# Patient Record
Sex: Male | Born: 1975 | Race: White | Hispanic: No | Marital: Married | Smoking: Never smoker
Health system: Southern US, Community
[De-identification: ages and names within clinical notes are randomized; demographics above are authoritative.]

## PROBLEM LIST (undated history)

## (undated) DIAGNOSIS — M75102 Unspecified rotator cuff tear or rupture of left shoulder, not specified as traumatic: Secondary | ICD-10-CM

## (undated) DIAGNOSIS — J45909 Unspecified asthma, uncomplicated: Secondary | ICD-10-CM

## (undated) DIAGNOSIS — M12812 Other specific arthropathies, not elsewhere classified, left shoulder: Secondary | ICD-10-CM

## (undated) DIAGNOSIS — J301 Allergic rhinitis due to pollen: Secondary | ICD-10-CM

## (undated) DIAGNOSIS — I10 Essential (primary) hypertension: Secondary | ICD-10-CM

## (undated) DIAGNOSIS — M25562 Pain in left knee: Secondary | ICD-10-CM

## (undated) DIAGNOSIS — F32A Depression, unspecified: Secondary | ICD-10-CM

## (undated) DIAGNOSIS — F329 Major depressive disorder, single episode, unspecified: Secondary | ICD-10-CM

## (undated) DIAGNOSIS — K219 Gastro-esophageal reflux disease without esophagitis: Secondary | ICD-10-CM

## (undated) DIAGNOSIS — M25561 Pain in right knee: Secondary | ICD-10-CM

## (undated) DIAGNOSIS — F319 Bipolar disorder, unspecified: Secondary | ICD-10-CM

## (undated) DIAGNOSIS — G8929 Other chronic pain: Secondary | ICD-10-CM

## (undated) DIAGNOSIS — G4700 Insomnia, unspecified: Secondary | ICD-10-CM

## (undated) HISTORY — DX: Morbid (severe) obesity due to excess calories: E66.01

## (undated) HISTORY — DX: Unspecified asthma, uncomplicated: J45.909

## (undated) HISTORY — DX: Allergic rhinitis due to pollen: J30.1

## (undated) HISTORY — DX: Gastro-esophageal reflux disease without esophagitis: K21.9

## (undated) HISTORY — DX: Pain in right knee: M25.561

## (undated) HISTORY — DX: Other specific arthropathies, not elsewhere classified, left shoulder: M12.812

## (undated) HISTORY — DX: Depression, unspecified: F32.A

## (undated) HISTORY — DX: Bipolar disorder, unspecified: F31.9

## (undated) HISTORY — PX: BICEPS TENDON REPAIR: SHX566

## (undated) HISTORY — DX: Insomnia, unspecified: G47.00

## (undated) HISTORY — DX: Unspecified rotator cuff tear or rupture of left shoulder, not specified as traumatic: M75.102

## (undated) HISTORY — DX: Pain in left knee: M25.562

## (undated) HISTORY — DX: Other chronic pain: G89.29

## (undated) HISTORY — DX: Essential (primary) hypertension: I10

## (undated) HISTORY — DX: Major depressive disorder, single episode, unspecified: F32.9

---

## 2016-06-13 ENCOUNTER — Telehealth: Payer: Self-pay | Admitting: *Deleted

## 2016-06-13 NOTE — Telephone Encounter (Signed)
PreVisit Call completed. Pt states he turned in his New Patient Packet. Pt states he received a flu vaccine 12/2015. Unknown last Tdap. He states he needs medication refills. He states he has not had blood work done in over 6 months. Pt states that he has been experiencing bilat foot pain x3 weeks. He denies any open areas or wounds. He states it feels more like a nerve pain that starts in the heel of his foot and shoots to his toes.

## 2016-06-14 ENCOUNTER — Ambulatory Visit: Payer: BC Managed Care – PPO | Admitting: Family Medicine

## 2016-06-15 ENCOUNTER — Encounter: Payer: Self-pay | Admitting: Family Medicine

## 2016-06-15 ENCOUNTER — Other Ambulatory Visit: Payer: Self-pay

## 2016-06-15 ENCOUNTER — Ambulatory Visit (INDEPENDENT_AMBULATORY_CARE_PROVIDER_SITE_OTHER): Payer: BC Managed Care – PPO | Admitting: Family Medicine

## 2016-06-15 VITALS — BP 142/84 | HR 80 | Temp 98.0°F | Ht 72.0 in | Wt 310.8 lb

## 2016-06-15 DIAGNOSIS — J301 Allergic rhinitis due to pollen: Secondary | ICD-10-CM | POA: Diagnosis not present

## 2016-06-15 DIAGNOSIS — K219 Gastro-esophageal reflux disease without esophagitis: Secondary | ICD-10-CM | POA: Diagnosis not present

## 2016-06-15 DIAGNOSIS — J4540 Moderate persistent asthma, uncomplicated: Secondary | ICD-10-CM | POA: Insufficient documentation

## 2016-06-15 DIAGNOSIS — E782 Mixed hyperlipidemia: Secondary | ICD-10-CM

## 2016-06-15 DIAGNOSIS — J4520 Mild intermittent asthma, uncomplicated: Secondary | ICD-10-CM | POA: Diagnosis not present

## 2016-06-15 DIAGNOSIS — E1142 Type 2 diabetes mellitus with diabetic polyneuropathy: Secondary | ICD-10-CM | POA: Diagnosis not present

## 2016-06-15 DIAGNOSIS — E1159 Type 2 diabetes mellitus with other circulatory complications: Secondary | ICD-10-CM | POA: Insufficient documentation

## 2016-06-15 DIAGNOSIS — G629 Polyneuropathy, unspecified: Secondary | ICD-10-CM | POA: Diagnosis not present

## 2016-06-15 DIAGNOSIS — M25561 Pain in right knee: Secondary | ICD-10-CM

## 2016-06-15 DIAGNOSIS — M25562 Pain in left knee: Secondary | ICD-10-CM

## 2016-06-15 DIAGNOSIS — G4700 Insomnia, unspecified: Secondary | ICD-10-CM | POA: Diagnosis not present

## 2016-06-15 DIAGNOSIS — R945 Abnormal results of liver function studies: Secondary | ICD-10-CM

## 2016-06-15 DIAGNOSIS — M75102 Unspecified rotator cuff tear or rupture of left shoulder, not specified as traumatic: Secondary | ICD-10-CM

## 2016-06-15 DIAGNOSIS — M12812 Other specific arthropathies, not elsewhere classified, left shoulder: Secondary | ICD-10-CM | POA: Diagnosis not present

## 2016-06-15 DIAGNOSIS — R7989 Other specified abnormal findings of blood chemistry: Secondary | ICD-10-CM | POA: Diagnosis not present

## 2016-06-15 DIAGNOSIS — F39 Unspecified mood [affective] disorder: Secondary | ICD-10-CM | POA: Insufficient documentation

## 2016-06-15 DIAGNOSIS — F319 Bipolar disorder, unspecified: Secondary | ICD-10-CM

## 2016-06-15 DIAGNOSIS — G8929 Other chronic pain: Secondary | ICD-10-CM

## 2016-06-15 DIAGNOSIS — F3181 Bipolar II disorder: Secondary | ICD-10-CM | POA: Insufficient documentation

## 2016-06-15 DIAGNOSIS — J45909 Unspecified asthma, uncomplicated: Secondary | ICD-10-CM | POA: Insufficient documentation

## 2016-06-15 DIAGNOSIS — I10 Essential (primary) hypertension: Secondary | ICD-10-CM | POA: Insufficient documentation

## 2016-06-15 DIAGNOSIS — E559 Vitamin D deficiency, unspecified: Secondary | ICD-10-CM

## 2016-06-15 HISTORY — DX: Insomnia, unspecified: G47.00

## 2016-06-15 HISTORY — DX: Other specific arthropathies, not elsewhere classified, left shoulder: M12.812

## 2016-06-15 HISTORY — DX: Unspecified rotator cuff tear or rupture of left shoulder, not specified as traumatic: M75.102

## 2016-06-15 HISTORY — DX: Other chronic pain: G89.29

## 2016-06-15 HISTORY — DX: Allergic rhinitis due to pollen: J30.1

## 2016-06-15 HISTORY — DX: Gastro-esophageal reflux disease without esophagitis: K21.9

## 2016-06-15 HISTORY — DX: Morbid (severe) obesity due to excess calories: E66.01

## 2016-06-15 HISTORY — DX: Bipolar disorder, unspecified: F31.9

## 2016-06-15 MED ORDER — OMEPRAZOLE 40 MG PO CPDR: 40.0000 mg | DELAYED_RELEASE_CAPSULE | Freq: Every day | ORAL | 3 refills | Status: DC

## 2016-06-15 MED ORDER — ZOLPIDEM TARTRATE 10 MG PO TABS: 10.0000 mg | ORAL_TABLET | Freq: Every day | ORAL | 3 refills | Status: DC

## 2016-06-15 MED ORDER — CLONAZEPAM 0.5 MG PO TABS: 0.5000 mg | ORAL_TABLET | Freq: Every day | ORAL | 1 refills | Status: DC

## 2016-06-15 MED ORDER — AMLODIPINE BESYLATE 5 MG PO TABS: 5.0000 mg | ORAL_TABLET | Freq: Every day | ORAL | 3 refills | Status: DC

## 2016-06-15 MED ORDER — ESCITALOPRAM OXALATE 20 MG PO TABS: 20.0000 mg | ORAL_TABLET | Freq: Every day | ORAL | 3 refills | Status: DC

## 2016-06-15 NOTE — Progress Notes (Signed)
Pre visit review using our clinic review tool, if applicable. No additional management support is needed unless otherwise documented below in the visit note. 

## 2016-06-15 NOTE — Patient Instructions (Signed)
It was nice to meet you!

## 2016-06-15 NOTE — Progress Notes (Signed)
Brett [name] is a 41 y.o. male is here to [place].   History of Present Illness:   Water quality scientist, CMA, acting as scribe for Dr. Juleen China.  CC: States he is here to establish care today.  Also needs medication refills and has constant pain in both feet.  States the pain is severe.  Sharp and stabbing in nature.  States he has joint pain in both knees and his left shoulder as well.  He has taken Aleve for his joint pain and states that it helps somewhat.  Would like to find other options because he is afraid long-term use of Aleve will cause health problems.   HPI:  1. Bipolar 2 disorder ([place]). Previously followed by Psych. Cycles fairly quickly. Has issues with "anger" when he is up. Does well on current regimen. Sometimes needs a benzodiazepine to help him sleep. Psychological ROS: negative for - hallucinations, hostility, obsessive thoughts or suicidal ideation.   2. Left rotator cuff tear arthropathy. Previous tear. Never had repair or rehab. Has ROM but with pain. Taking daily NSAIDs.    3. Essential hypertension.  On Norvasc. Cardiovascular ROS: no chest pain or dyspnea on exertion.   4. Gastroesophageal reflux disease without esophagitis. Daily Prilosec. Gastrointestinal ROS: no abdominal pain, change in bowel habits, or black or bloody stools.   5. Chronic seasonal allergic rhinitis due to pollen. Stable. Takes Allegra daily. Allergy and Immunology ROS: negative for - itchy/watery eyes, nasal congestion or postnasal drip.   6. Pain in Feet. NEW. Bilateral. Feels numb. Terrible pain when something touches his feet. This has been going on for months but he never discussed it with his previous PCP. No treatment.     7. Morbid obesity ([place]). Intentional weight loss of 50 pounds through eating a healthier diet. Wants to get back to exercising.    8. Insomnia, unspecified type. Due to Bipolar swing as above.    9. Chronic pain of both . Arthralgia. He was a runner for years.  Meniscus injury to left knee in the past. ROS: positive for cracking, popping, sometimes edema of knees.   Health Maintenance Due  Topic Date Due  . TETANUS/TDAP  12/30/1993   PMHx, SurgHx, SocialHx, Medications, and Allergies were reviewed in the Visit Navigator and updated as appropriate.   Past Medical History:  Diagnosis Date  . Asthma   . Bipolar 1 disorder ([place]) 06/15/2016  . Chronic pain of both knees 06/15/2016  . Chronic seasonal allergic rhinitis due to pollen 06/15/2016  . Depression   . Gastroesophageal reflux disease without esophagitis 06/15/2016  . Hypertension   . Insomnia 06/15/2016  . Left rotator cuff tear arthropathy 06/15/2016  . Morbid obesity ([place]) 06/15/2016   Past Surgical History:  Procedure Laterality Date  . BICEPS TENDON REPAIR     History reviewed. No pertinent family history.  Social History  Substance Use Topics  . Smoking status: Never Smoker  . Smokeless tobacco: Never Used  . Alcohol use No   Current Medications and Allergies:   .  amLODipine (NORVASC) 5 MG tablet, Take 1 tablet (5 mg total) by mouth daily., Disp: 90 tablet, Rfl: 3 .  cyanocobalamin 1000 MCG tablet, Take 1,000 mcg by mouth daily., Disp: , Rfl:  .  escitalopram (LEXAPRO) 20 MG tablet, Take 1 tablet (20 mg total) by mouth at bedtime., Disp: 90 tablet, Rfl: 3 .  Fexofenadine HCl (ALLEGRA ALLERGY PO), Take by mouth., Disp: , Rfl:  .  MAGNESIUM CITRATE PO,  Take 1 tablet by mouth every evening., Disp: , Rfl:  .  zolpidem (AMBIEN) 10 MG tablet, Take 1 tablet (10 mg total) by mouth at bedtime., Disp: 30 tablet, Rfl: 3 .  celecoxib (CELEBREX) 100 MG capsule, Take 1 capsule (100 mg total) by mouth 2 (two) times daily as needed., Disp: 60 capsule, Rfl: 2 .  clonazePAM (KLONOPIN) 0.5 MG tablet, Take 1 tablet (0.5 mg total) by mouth at bedtime., Disp: 20 tablet, Rfl: 1 .  omeprazole (PRILOSEC) 40 MG capsule, Take 1 capsule (40 mg total) by mouth daily., Disp: 30 capsule, Rfl: 3  No Known  Allergies   Review of Systems:   Review of Systems  Constitutional: Negative for chills and fever.  HENT: Positive for congestion. Negative for sinus pain.   Eyes: Negative for blurred vision.  Respiratory: Positive for cough.   Cardiovascular: Negative for chest pain and palpitations.  Gastrointestinal: Negative for abdominal pain, nausea and vomiting.  Genitourinary: Negative for frequency.  Musculoskeletal: Positive for joint pain.  Skin: Negative for rash.  Neurological: Negative for loss of consciousness and headaches.  Psychiatric/Behavioral: Positive for depression.    Vitals:   Vitals:   06/15/16 1409  BP: (!) 142/84  Pulse: 80  Temp: 98 F (36.7 C)  TempSrc: Oral  SpO2: 94%  Weight: (!) 310 lb 12.8 oz (141 kg)  Height: 6' (1.829 m)     Body mass index is 42.15 kg/m.  Physical Exam:   Physical Exam  Constitutional: He is oriented to person, place, and time. He appears well-developed and well-nourished. No distress.  HENT:  Head: Normocephalic and atraumatic.  Right Ear: External ear normal.  Left Ear: External ear normal.  Nose: Nose normal.  Mouth/Throat: Oropharynx is clear and moist.  Eyes: Conjunctivae and EOM are normal. Pupils are equal, round, and reactive to light.  Neck: Normal range of motion. Neck supple.  Cardiovascular: Normal rate, regular rhythm, normal heart sounds and intact distal pulses.   Pulmonary/Chest: Effort normal and breath sounds normal.  Abdominal: Soft. Bowel sounds are normal.  Musculoskeletal:       Left shoulder: He exhibits decreased range of motion.       Right knee: Tenderness found. Medial joint line and lateral joint line tenderness noted.       Left knee: Tenderness found. Medial joint line and lateral joint line tenderness noted.  Neurological: He is alert and oriented to person, place, and time.  Skin: Skin is warm and dry.  Psychiatric: He has a normal mood and affect. His behavior is normal. Judgment and thought  content normal.  Nursing note and vitals reviewed.   Assessment and Plan:    Yaser was seen today for establish care, foot pain and medication refill.  Diagnoses and all orders for this visit:  Bipolar 2 disorder ([place]) -     Ambulatory referral to Psychiatry -     clonazePAM (KLONOPIN) 0.5 MG tablet; Take 1 tablet (0.5 mg total) by mouth at bedtime.       -     escitalopram (LEXAPRO) 20 MG tablet; Take 1 tablet (20 mg total) by mouth at bedtime.  Left rotator cuff tear arthropathy Comments: Patient with ROM but obvious pain. Taking NSAIDs daily. He would like to rehab the shoulder and improve pain. Will refer to RIGBY/JAMES.   Essential hypertension Comments: Elevated today. Labs pending. Patient will monitor. Recheck at next visit.   Orders:       -     amLODipine (  NORVASC) 5 MG tablet; Take 1 tablet (5 mg total) by mouth daily.  Gastroesophageal reflux disease without esophagitis Comments: Stable.  Chronic seasonal allergic rhinitis due to pollen Comments: Well controlled.  No signs of complications, medication side effects, or red flags.  Continue current regimen.    Peripheral polyneuropathy ([place]) Comments: New. Labs pending. Discussed Neurontin as a medication that could help with PN as well as mood stabilization.  Orders: -     Vitamin B12 -     CBC with Differential/Platelet  Morbid obesity ([place]) Comments: Improving. Orders: -     Hemoglobin A1c -     Comprehensive metabolic panel -     T4, free -     TSH -     Lipid panel  Insomnia, unspecified type Comments: Affected by Bipolar.         Orders:  -     zolpidem (AMBIEN) 10 MG tablet; Take 1 tablet (10 mg total) by mouth at bedtime.  Chronic pain of both knees Comments: Arthralgia. Hx of meniscus injury to left knee.  Orders: -     AMB referral to sports medicine  Vitamin D deficiency Comments: . Orders: -     VITAMIN D 25 Hydroxy (Vit-D Deficiency, Fractures)  Mild intermittent asthma  without complication Comments: Stable.   . Reviewed expectations re: course of current medical issues. . Discussed self-management of symptoms. . Outlined signs and symptoms indicating need for more acute intervention. . Patient verbalized understanding and all questions were answered. . See orders for this visit as documented in the electronic medical record. . Patient received an After Visit Summary.  Records requested if needed. I spent 60 minutes with this patient, greater than 50% was face-to-face time counseling regarding the above diagnoses.  CMA served as Education administrator during this visit. History, Physical, and Plan performed by medical provider. Documentation and orders reviewed and attested to. Briscoe Deutscher, D.O.  Briscoe Deutscher, [place], [place] 06/16/2016   Follow-up: 3 months.  Meds ordered this encounter  Medications  . DISCONTD: naproxen sodium (ANAPROX) 220 MG tablet    Sig: Take by mouth.  . DISCONTD: amLODipine (NORVASC) 5 MG tablet    Sig: Take by mouth.  . DISCONTD: escitalopram (LEXAPRO) 20 MG tablet    Sig: Take by mouth.  . DISCONTD: omeprazole (PRILOSEC) 40 MG capsule    Sig: Take by mouth.  . DISCONTD: zolpidem (AMBIEN) 10 MG tablet  . cyanocobalamin 1000 MCG tablet    Sig: Take 1,000 mcg by mouth daily.  Marland Kitchen MAGNESIUM CITRATE PO    Sig: Take 1 tablet by mouth every evening.  Marland Kitchen DISCONTD: Naproxen Sodium (ALEVE) 220 MG CAPS    Sig: Take 3 capsules by mouth 2 (two) times daily.  Marland Kitchen Fexofenadine HCl (ALLEGRA ALLERGY PO)    Sig: Take by mouth.  Marland Kitchen amLODipine (NORVASC) 5 MG tablet    Sig: Take 1 tablet (5 mg total) by mouth daily.    Dispense:  90 tablet    Refill:  3  . escitalopram (LEXAPRO) 20 MG tablet    Sig: Take 1 tablet (20 mg total) by mouth at bedtime.    Dispense:  90 tablet    Refill:  3  . zolpidem (AMBIEN) 10 MG tablet    Sig: Take 1 tablet (10 mg total) by mouth at bedtime.    Dispense:  30 tablet    Refill:  3  . clonazePAM  (KLONOPIN) 0.5 MG tablet    Sig:  Take 1 tablet (0.5 mg total) by mouth at bedtime.    Dispense:  20 tablet    Refill:  1   Medications Discontinued During This Encounter  Medication Reason  . naproxen sodium (ANAPROX) 220 MG tablet Error  . amLODipine (NORVASC) 5 MG tablet Reorder  . escitalopram (LEXAPRO) 20 MG tablet Reorder  . zolpidem (AMBIEN) 10 MG tablet Reorder   Orders Placed This Encounter  Procedures  . Hemoglobin A1c  . Comprehensive metabolic panel  . T4, free  . TSH  . Vitamin B12  . VITAMIN D 25 Hydroxy (Vit-D Deficiency, Fractures)  . Lipid panel  . CBC with Differential/Platelet  . AMB referral to sports medicine  . Ambulatory referral to Psychiatry

## 2016-06-16 ENCOUNTER — Ambulatory Visit (INDEPENDENT_AMBULATORY_CARE_PROVIDER_SITE_OTHER): Payer: BC Managed Care – PPO | Admitting: Sports Medicine

## 2016-06-16 ENCOUNTER — Encounter: Payer: Self-pay | Admitting: Sports Medicine

## 2016-06-16 ENCOUNTER — Encounter: Payer: Self-pay | Admitting: Family Medicine

## 2016-06-16 ENCOUNTER — Ambulatory Visit (INDEPENDENT_AMBULATORY_CARE_PROVIDER_SITE_OTHER): Payer: BC Managed Care – PPO

## 2016-06-16 VITALS — BP 140/110 | HR 102 | Ht 72.0 in | Wt 308.0 lb

## 2016-06-16 DIAGNOSIS — R945 Abnormal results of liver function studies: Secondary | ICD-10-CM

## 2016-06-16 DIAGNOSIS — E1142 Type 2 diabetes mellitus with diabetic polyneuropathy: Secondary | ICD-10-CM | POA: Insufficient documentation

## 2016-06-16 DIAGNOSIS — G8929 Other chronic pain: Secondary | ICD-10-CM | POA: Diagnosis not present

## 2016-06-16 DIAGNOSIS — M7542 Impingement syndrome of left shoulder: Secondary | ICD-10-CM

## 2016-06-16 DIAGNOSIS — M25512 Pain in left shoulder: Secondary | ICD-10-CM | POA: Diagnosis not present

## 2016-06-16 DIAGNOSIS — R7989 Other specified abnormal findings of blood chemistry: Secondary | ICD-10-CM | POA: Insufficient documentation

## 2016-06-16 DIAGNOSIS — M25561 Pain in right knee: Secondary | ICD-10-CM | POA: Diagnosis not present

## 2016-06-16 DIAGNOSIS — E785 Hyperlipidemia, unspecified: Secondary | ICD-10-CM

## 2016-06-16 DIAGNOSIS — M25819 Other specified joint disorders, unspecified shoulder: Secondary | ICD-10-CM | POA: Insufficient documentation

## 2016-06-16 DIAGNOSIS — M7540 Impingement syndrome of unspecified shoulder: Secondary | ICD-10-CM | POA: Insufficient documentation

## 2016-06-16 DIAGNOSIS — E1169 Type 2 diabetes mellitus with other specified complication: Secondary | ICD-10-CM | POA: Insufficient documentation

## 2016-06-16 DIAGNOSIS — M25562 Pain in left knee: Secondary | ICD-10-CM

## 2016-06-16 LAB — CBC WITH DIFFERENTIAL/PLATELET
Basophils Absolute: 0.1 10*3/uL (ref 0.0–0.1)
Basophils Relative: 1.1 % (ref 0.0–3.0)
Eosinophils Absolute: 0.2 10*3/uL (ref 0.0–0.7)
Eosinophils Relative: 2.8 % (ref 0.0–5.0)
HCT: 49 % (ref 39.0–52.0)
Hemoglobin: 16.5 g/dL (ref 13.0–17.0)
Lymphocytes Relative: 38.7 % (ref 12.0–46.0)
Lymphs Abs: 3.4 10*3/uL (ref 0.7–4.0)
MCHC: 33.7 g/dL (ref 30.0–36.0)
MCV: 85.9 fl (ref 78.0–100.0)
Monocytes Absolute: 0.7 10*3/uL (ref 0.1–1.0)
Monocytes Relative: 7.4 % (ref 3.0–12.0)
Neutro Abs: 4.5 10*3/uL (ref 1.4–7.7)
Neutrophils Relative %: 50 % (ref 43.0–77.0)
Platelets: 186 10*3/uL (ref 150.0–400.0)
RBC: 5.7 Mil/uL (ref 4.22–5.81)
RDW: 13.5 % (ref 11.5–15.5)
WBC: 8.9 10*3/uL (ref 4.0–10.5)

## 2016-06-16 LAB — LIPID PANEL
Cholesterol: 213 mg/dL — ABNORMAL HIGH (ref 0–200)
HDL: 40.9 mg/dL (ref >39.00)
LDL Cholesterol: 133 mg/dL — ABNORMAL HIGH (ref 0–99)
NonHDL: 172.24
Total CHOL/HDL Ratio: 5
Triglycerides: 198 mg/dL — ABNORMAL HIGH (ref 0.0–149.0)
VLDL: 39.6 mg/dL (ref 0.0–40.0)

## 2016-06-16 LAB — VITAMIN B12: Vitamin B-12: 1003 pg/mL — ABNORMAL HIGH (ref 211–911)

## 2016-06-16 LAB — COMPREHENSIVE METABOLIC PANEL
ALT: 91 U/L — ABNORMAL HIGH (ref 0–53)
AST: 44 U/L — ABNORMAL HIGH (ref 0–37)
Albumin: 4.6 g/dL (ref 3.5–5.2)
Alkaline Phosphatase: 68 U/L (ref 39–117)
BUN: 14 mg/dL (ref 6–23)
CO2: 31 mEq/L (ref 19–32)
Calcium: 10.1 mg/dL (ref 8.4–10.5)
Chloride: 97 mEq/L (ref 96–112)
Creatinine, Ser: 0.72 mg/dL (ref 0.40–1.50)
GFR: 127.58 mL/min (ref >60.00)
Glucose, Bld: 216 mg/dL — ABNORMAL HIGH (ref 70–99)
Potassium: 4.5 mEq/L (ref 3.5–5.1)
Sodium: 138 mEq/L (ref 135–145)
Total Bilirubin: 0.6 mg/dL (ref 0.2–1.2)
Total Protein: 7.2 g/dL (ref 6.0–8.3)

## 2016-06-16 LAB — HEMOGLOBIN A1C: Hgb A1c MFr Bld: 11.1 % — ABNORMAL HIGH (ref 4.6–6.5)

## 2016-06-16 LAB — TSH: TSH: 2.28 u[IU]/mL (ref 0.35–4.50)

## 2016-06-16 LAB — T4, FREE: Free T4: 0.85 ng/dL (ref 0.60–1.60)

## 2016-06-16 LAB — VITAMIN D 25 HYDROXY (VIT D DEFICIENCY, FRACTURES): VITD: 19.6 ng/mL — ABNORMAL LOW (ref 30.00–100.00)

## 2016-06-16 IMAGING — DX DG SHOULDER 2+V*L*
3 series · 3 of 3 positions shown · non-contrast
Comparison: None.

CLINICAL DATA: Chronic left shoulder pain.  No recent injury.

EXAM:
LEFT SHOULDER - 2+ VIEW

[shoulder grashey ap]
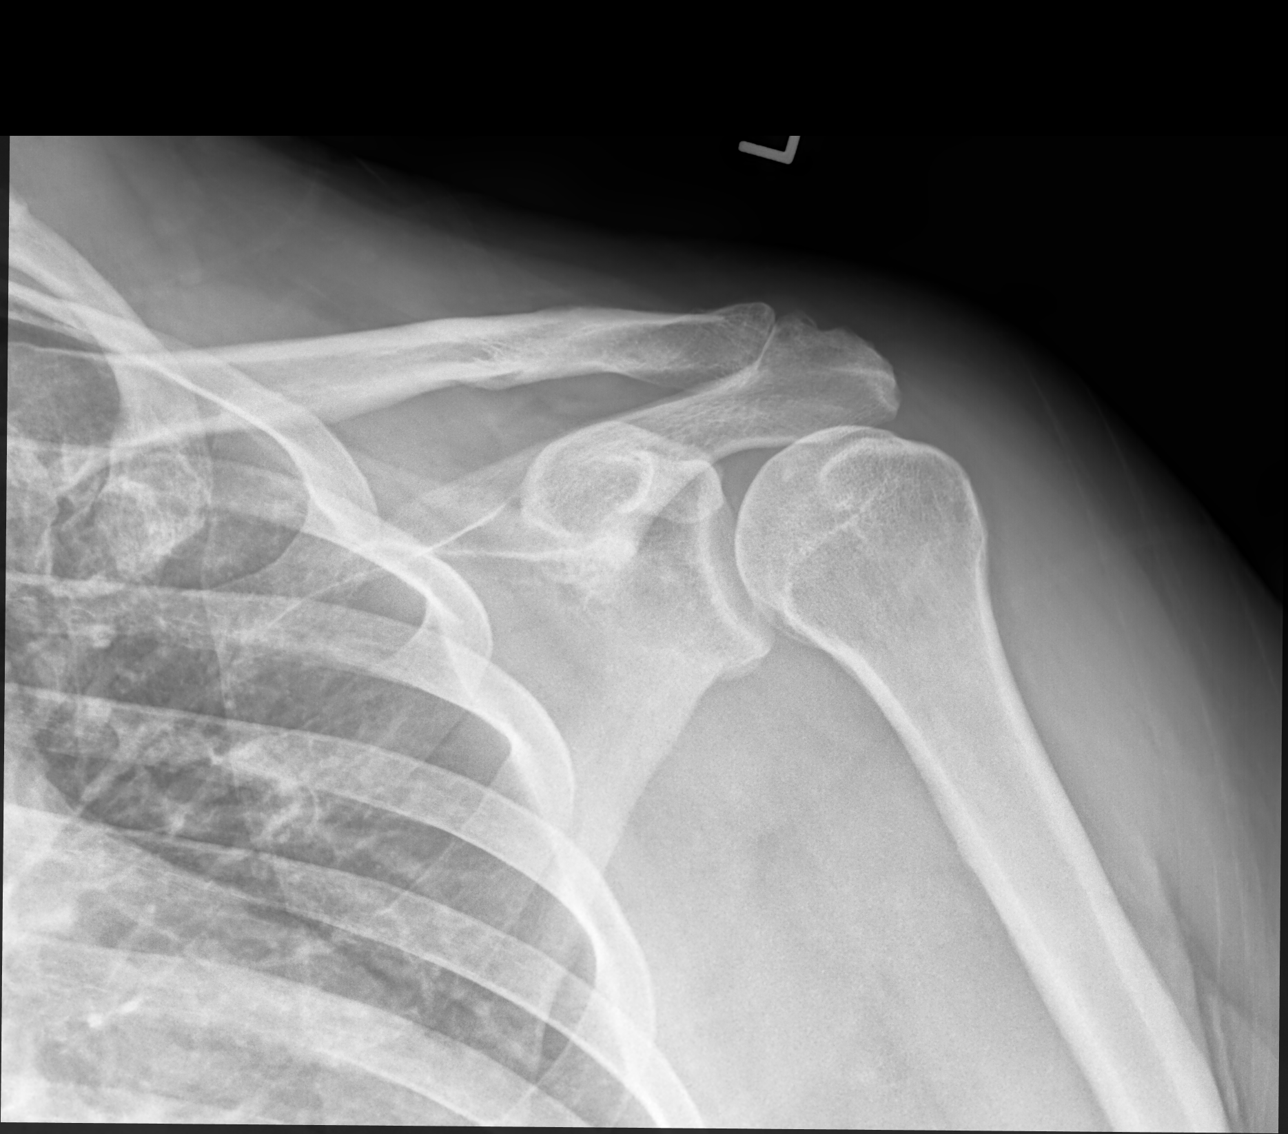

[shoulder y view]
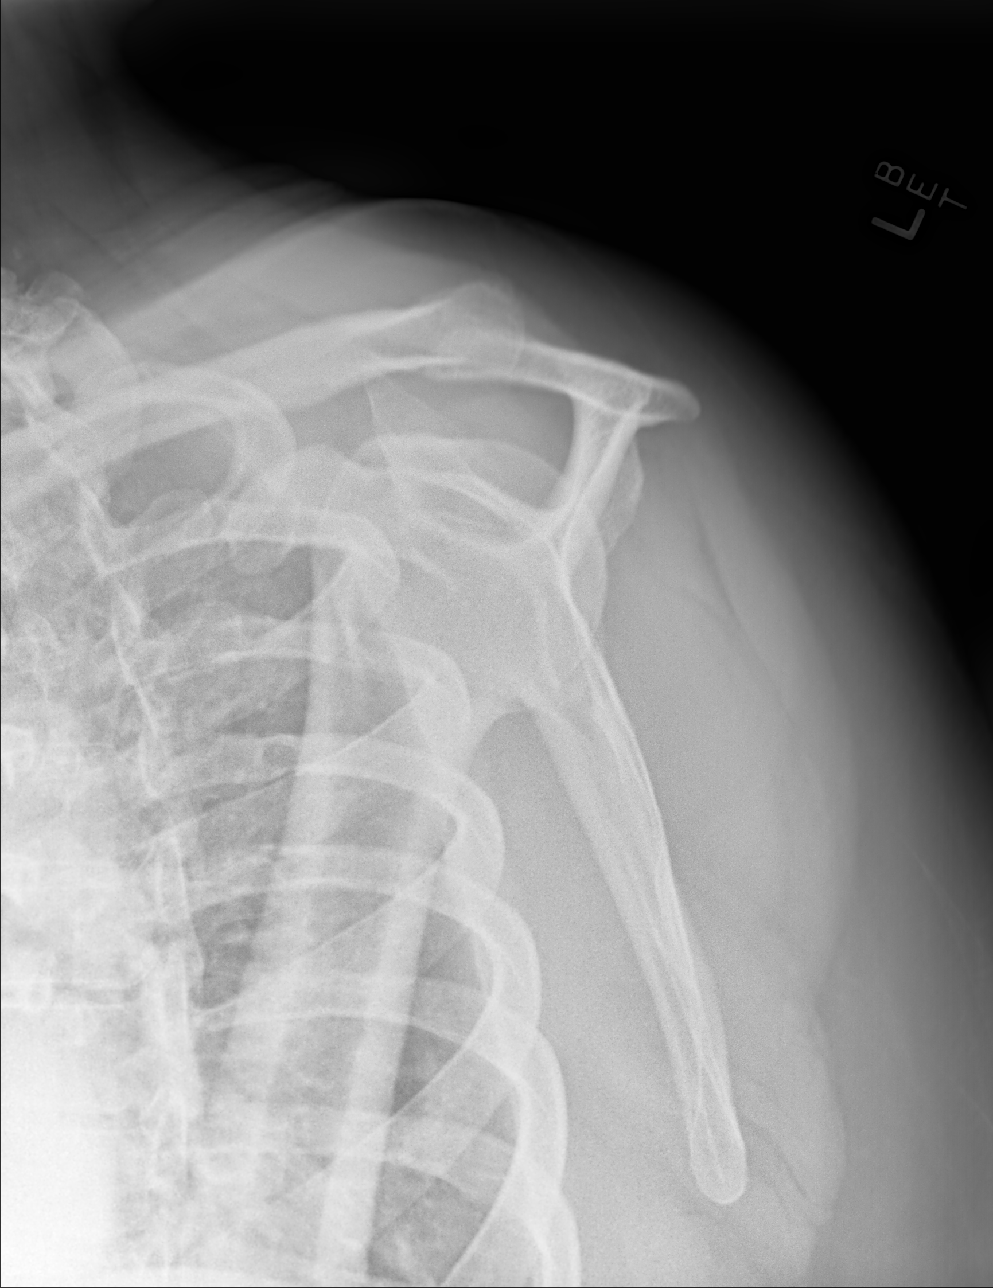

[shoulder axial]
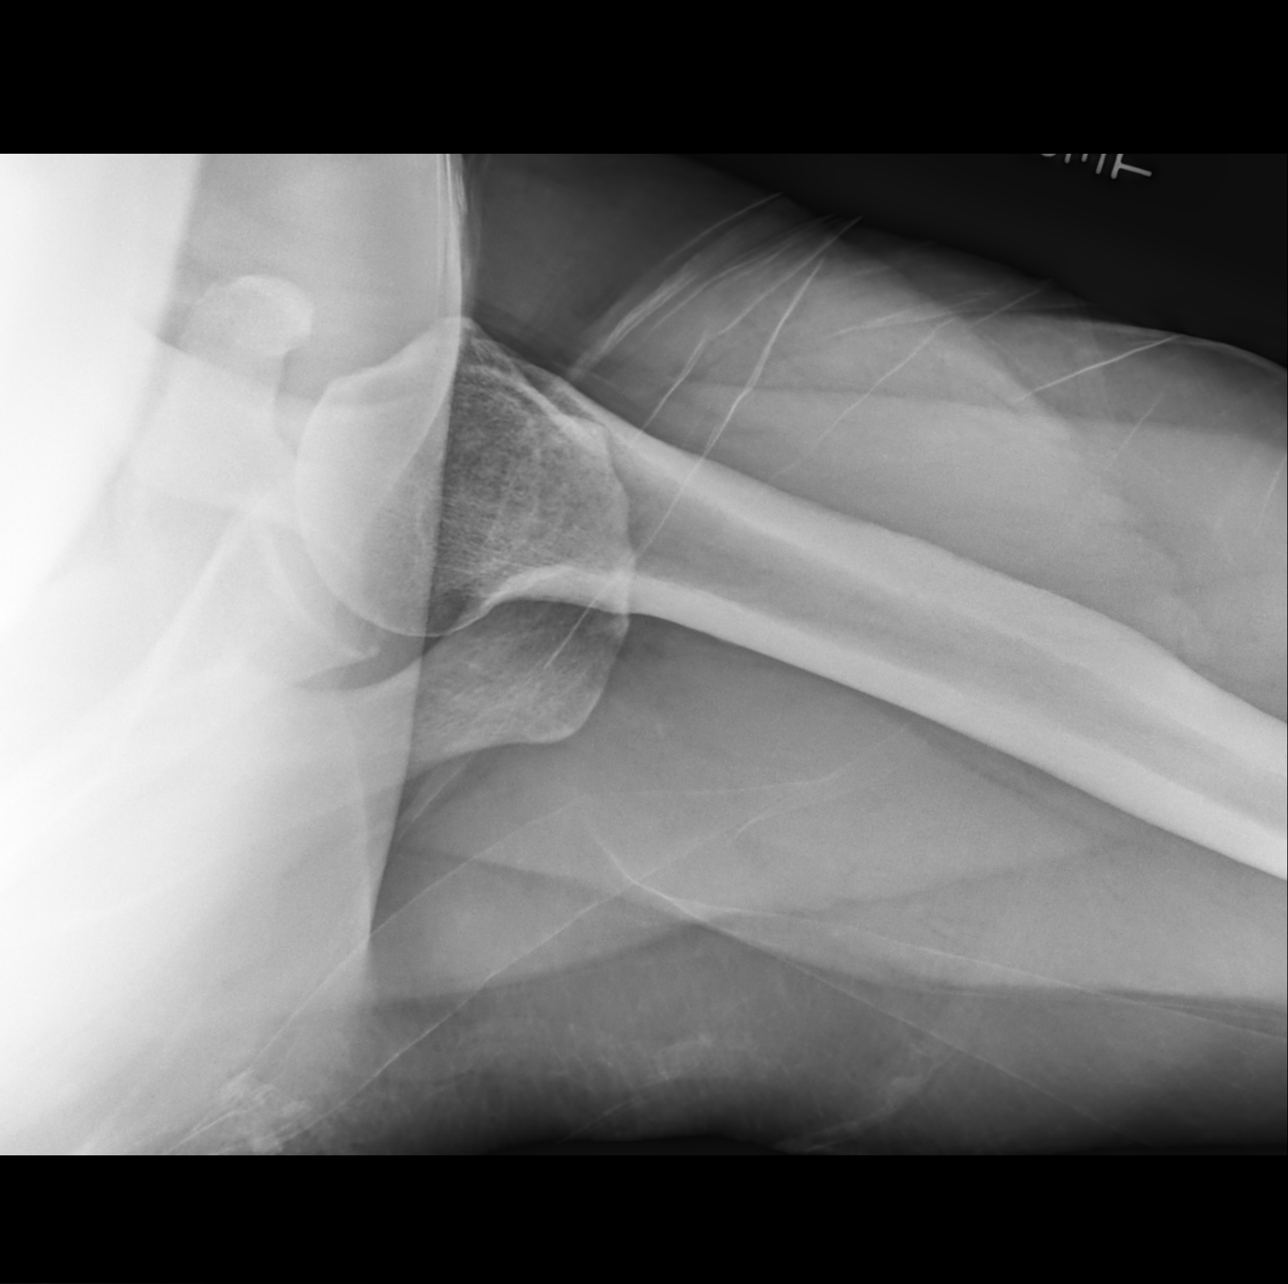

[3 of 3 positions shown; findings below may reference images not displayed]

FINDINGS: Mild degenerative changes in the left AC joint. Glenohumeral joint
is maintained. No acute bony abnormality. Specifically, no fracture,
subluxation, or dislocation. Soft tissues are intact.
IMPRESSION: Mild degenerative changes in the left AC joint. No acute bony
abnormality.

## 2016-06-16 MED ORDER — CELECOXIB 100 MG PO CAPS: 100.0000 mg | ORAL_CAPSULE | Freq: Two times a day (BID) | ORAL | 2 refills | Status: DC | PRN

## 2016-06-16 NOTE — Patient Instructions (Signed)
Please perform the exercise program that Jeneen Rinks has prepared for you and gone over in detail on a daily basis.  In addition to the handout you were provided you can access your program through: [email or web address]   Your unique program code is: [email or web address]

## 2016-06-16 NOTE — Progress Notes (Signed)
Addendum to NEW PATIENT VISIT.  Results for orders placed or performed in visit on 06/15/16  Hemoglobin A1c  Result Value Ref Range   Hgb A1c MFr Bld 11.1 (H) 4.6 - 6.5 %  Comprehensive metabolic panel  Result Value Ref Range   Sodium 138 135 - 145 mEq/L   Potassium 4.5 3.5 - 5.1 mEq/L   Chloride 97 96 - 112 mEq/L   CO2 31 19 - 32 mEq/L   Glucose, Bld 216 (H) 70 - 99 mg/dL   BUN 14 6 - 23 mg/dL   Creatinine, Ser 0.72 0.40 - 1.50 mg/dL   Total Bilirubin 0.6 0.2 - 1.2 mg/dL   Alkaline Phosphatase 68 39 - 117 U/L   AST 44 (H) 0 - 37 U/L   ALT 91 (H) 0 - 53 U/L   Total Protein 7.2 6.0 - 8.3 g/dL   Albumin 4.6 3.5 - 5.2 g/dL   Calcium 10.1 8.4 - 10.5 mg/dL   GFR 127.58 >60.00 mL/min  T4, free  Result Value Ref Range   Free T4 0.85 0.60 - 1.60 ng/dL  TSH  Result Value Ref Range   TSH 2.28 0.35 - 4.50 uIU/mL  Vitamin B12  Result Value Ref Range   Vitamin B-12 1,003 (H) 211 - 911 pg/mL  VITAMIN D 25 Hydroxy (Vit-D Deficiency, Fractures)  Result Value Ref Range   VITD 19.60 (L) 30.00 - 100.00 ng/mL  Lipid panel  Result Value Ref Range   Cholesterol 213 (H) 0 - 200 mg/dL   Triglycerides 198.0 (H) 0.0 - 149.0 mg/dL   HDL 40.90 >39.00 mg/dL   VLDL 39.6 0.0 - 40.0 mg/dL   LDL Cholesterol 133 (H) 0 - 99 mg/dL   Total CHOL/HDL Ratio 5    NonHDL 172.24   CBC with Differential/Platelet  Result Value Ref Range   WBC 8.9 4.0 - 10.5 K/uL   RBC 5.70 4.22 - 5.81 Mil/uL   Hemoglobin 16.5 13.0 - 17.0 g/dL   HCT 49.0 39.0 - 52.0 %   MCV 85.9 78.0 - 100.0 fl   MCHC 33.7 30.0 - 36.0 g/dL   RDW 13.5 11.5 - 15.5 %   Platelets 186.0 150.0 - 400.0 K/uL   Neutrophils Relative % 50.0 43.0 - 77.0 %   Lymphocytes Relative 38.7 12.0 - 46.0 %   Monocytes Relative 7.4 3.0 - 12.0 %   Eosinophils Relative 2.8 0.0 - 5.0 %   Basophils Relative 1.1 0.0 - 3.0 %   Neutro Abs 4.5 1.4 - 7.7 K/uL   Lymphs Abs 3.4 0.7 - 4.0 K/uL   Monocytes Absolute 0.7 0.1 - 1.0 K/uL   Eosinophils Absolute 0.2 0.0 -  0.7 K/uL   Basophils Absolute 0.1 0.0 - 0.1 K/uL   Assessment/Plan:   Labs reviewed today. 06/16/2016 Patient will be instructed to follow up ASAP to review labs and to discuss his new diagnoses below.   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 Will discuss and start treatment at next visit. Will need: DM Education, EYE exam, ACEI/ARB or microalbumin, appropriate vaccinations. Will need to be 30 minute visit.   Lab Results  Component Value Date   HGBA1C 11.1 (H) 06/15/2016   Elevated LFTs Comments: NEW. Possible due to current medications, but more likely hepatic steatosis.   Mixed hyperlipidemia Comments: NEW. Will start statin.  Vitamin D deficiency Comments: Will supplement.    Briscoe Deutscher, DO [place]

## 2016-06-16 NOTE — Progress Notes (Signed)
Brett [name] - 41 y.o. male MRN [id number]  Date of birth: 04-28-1975  Office Visit Note: Visit Date: 06/16/2016 PCP: Briscoe Deutscher, DO Referred by: Briscoe Deutscher, DO  Subjective: Chief Complaint  Patient presents with  . LT Shoulder Pain    Pt lifted a refrigerator x2 years ago when he tore his Biceps tendon which was surgical fixed. But he has always had pain the shoulder even before injury. Pt did left very heavy weights in the past. Takes Aleve 600mg  bid with relief. Describes the pain as constant and dull. Unable to sleep on left or right side---does sleep with the arm elevated with pillows which does help.    HPI: Patient presents with multiple complaints most focally however is his left shoulder.  He has had issues with left shoulder and arm for many years and really this to being very active regarding lifting weights.  He has previously had a distal biceps tendon repair and has had persistent ongoing left anterior shoulder pain for the last several years.  He has regained function of the elbow but has had significant weakness ever since his repair several years ago.  He is having worsening pain/swelling on the side.  The pain does occasionally radiate all the way down into the hand but this is only intermittently.  He has not been exercising recently and reports overall a 50 pound weight loss over the past 6 months after he had a very significant weight gain following his surgery.  He would like to get back to being under 300 pounds with a goal of 250.  ROS:  Otherwise per HPI.  Objective:  VS:  HT:6' (182.9 cm)   WT:(!) 308 lb (139.7 kg)  BMI:41.9    BP:(!) 140/110  HR:(!) 102bpm  TEMP: ( )  RESP:95 % Physical Exam: GENERAL:  WDWN, NAD, Non-toxic appearing PSYCH:  Alert & appropriately interactive  Not depressed or anxious appearing UPPER EXTREMITIES:  No significant rashes/lesions/ulcerations overlying the arms.  Hypertrophic postsurgical incisions over the  anterior forearm.  No significant generalized UE edema.  No clubbing or cyanosis.  Radial pulses 2+/4.  Sensation intact to light touch. Left SHOULDER:   Normal alignment. No significant deformity.   Marked tenderness to palpation over the left AC joint.  He has pain with Luan Pulling as well as Neer's testing.  Generalized weakness within the shoulder and speeds testing, empty can testing and Yergason's testing.  Internal rotation and external rotation strength is 5 out of 5 and pain-free..  Full overhead range of motion and symmetric range of motion in general.  No pain with axial loading and circumduction but does have a small palpable click.   Imaging & Procedures: Dg Shoulder Left  Result Date: 06/16/2016 CLINICAL DATA:  Chronic left shoulder pain.  No recent injury. EXAM: LEFT SHOULDER - 2+ VIEW COMPARISON:  None. FINDINGS: Mild degenerative changes in the left [facility] joint. Glenohumeral joint is maintained. No acute bony abnormality. Specifically, no fracture, subluxation, or dislocation. Soft tissues are intact. IMPRESSION: Mild degenerative changes in the left AC joint. No acute bony abnormality. Electronically Signed   By: Rolm Baptise M.D.   On: 06/16/2016 10:53   PROCEDURE NOTE : Left subacromial injection After discussing the risks, benefits and expected outcomes of the injection and all questions were reviewed and answered,  he wished to undergo the above named procedure.  Written consent was obtained. After an appropriate time out was taken the target structure prepped and injected as below: Prep:  Betadine and alcohol,  Approach:  Posterior  Anesthes  Ethel chloride,   Needle:  22-gauge, 1.5 inch  Aspirate:  N/a  Meds: 3 cc of 0.5% Marcaine 1 cc of 40 mg Depo-Medrol 1 cc of 40 mg Kenalog  Dressing:  Bandaid  This procedure was well tolerated and there were no complications.    +++++++++++++++++++++++++++++++++++++++++++++++++++++++++++++++++++++++++++++ PROCEDURE NOTE:  THERAPEUTIC EXERCISES (97110) 15 minutes spent for Therapeutic exercises as stated in above notes.  This included exercises focusing on stretching, strengthening, with significant focus on eccentric aspects.   Proper technique shown and discussed handout in great detail with ATC.  All questions were discussed and answered.    Assessment & Plan: Problem List Items Addressed This Visit    Chronic pain of both knees    Currently taking 3 tablets of 220 mg naproxen sodium.  Counseled on discontinuing this.  Prescription for Celebrex sent in today as a possible alternative.  Recommend avoiding daily NSAID use as well as avoiding doubling up on any type of NSAID therapy.  Does need GI protection if he is not taking Celebrex.      Relevant Medications   celecoxib (CELEBREX) 100 MG capsule   Shoulder impingement    He is in some pain is consistent with shoulder impingement as well as possible AC joint arthropathy.  Given prior interventions with biceps tendon as well as rotator cuff pathology he may have recurrent tendinopathy.  Would consider further diagnostic evaluation with ultrasound versus MRI arthrogram and follow-up if any lack of improvement.  We will begin him on shoulder stabilization exercises as well as specific rotator cuff neuromuscular control exercises.  Slow return back into activity.       Other Visit Diagnoses    Chronic left shoulder pain    -  Primary   Relevant Orders   DG Shoulder Left (Completed)      Follow-up: Return in about 4 weeks (around 07/14/2016).   Past Medical/Family/Surgical/Social History: Medications & Allergies reviewed per EMR Patient Active Problem List   Diagnosis Date Noted  . Shoulder impingement 06/16/2016  . Elevated LFTs 06/16/2016  . Mixed hyperlipidemia 06/16/2016  . Type 2 diabetes mellitus with diabetic polyneuropathy, without long-term current use of insulin ([place]) 06/16/2016  . Bipolar 2 disorder ([place]) 06/15/2016  . Left rotator cuff tear  arthropathy 06/15/2016  . Essential hypertension 06/15/2016  . Gastroesophageal reflux disease without esophagitis 06/15/2016  . Chronic seasonal allergic rhinitis due to pollen 06/15/2016  . Peripheral polyneuropathy ([place]) 06/15/2016  . Morbid obesity ([place]) 06/15/2016  . Insomnia 06/15/2016  . Chronic pain of both knees 06/15/2016  . Asthma    Past Medical History:  Diagnosis Date  . Asthma   . Bipolar 1 disorder ([place]) 06/15/2016   Previously followed by Psychiatry. Hx of "anger issues." Hx of medication use has included Seroquel and Ativan, both of which have helped him in the past.   . Chronic pain of both knees 06/15/2016  . Chronic seasonal allergic rhinitis due to pollen 06/15/2016  . Depression   . Gastroesophageal reflux disease without esophagitis 06/15/2016  . Hypertension   . Insomnia 06/15/2016  . Left rotator cuff tear arthropathy 06/15/2016   s/p repair and PT. No ongoing issues.  . Morbid obesity ([place]) 06/15/2016   History reviewed. No pertinent family history. Past Surgical History:  Procedure Laterality Date  . 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 Smoking status: Never Smoker  . Smokeless tobacco: Never Used  . Alcohol use No  . Drug use: No  . Sexual activity: Yes

## 2016-06-16 NOTE — Assessment & Plan Note (Signed)
He is in some pain is consistent with shoulder impingement as well as possible AC joint arthropathy.  Given prior interventions with biceps tendon as well as rotator cuff pathology he may have recurrent tendinopathy.  Would consider further diagnostic evaluation with ultrasound versus MRI arthrogram and follow-up if any lack of improvement.  We will begin him on shoulder stabilization exercises as well as specific rotator cuff neuromuscular control exercises.  Slow return back into activity.

## 2016-06-16 NOTE — Assessment & Plan Note (Signed)
Currently taking 3 tablets of 220 mg naproxen sodium.  Counseled on discontinuing this.  Prescription for Celebrex sent in today as a possible alternative.  Recommend avoiding daily NSAID use as well as avoiding doubling up on any type of NSAID therapy.  Does need GI protection if he is not taking Celebrex.

## 2016-06-20 ENCOUNTER — Ambulatory Visit (INDEPENDENT_AMBULATORY_CARE_PROVIDER_SITE_OTHER): Payer: BC Managed Care – PPO | Admitting: Family Medicine

## 2016-06-20 ENCOUNTER — Telehealth: Payer: Self-pay | Admitting: Family Medicine

## 2016-06-20 ENCOUNTER — Encounter: Payer: Self-pay | Admitting: Family Medicine

## 2016-06-20 VITALS — BP 134/80 | HR 79 | Temp 98.5°F | Ht 72.0 in | Wt 304.0 lb

## 2016-06-20 DIAGNOSIS — G629 Polyneuropathy, unspecified: Secondary | ICD-10-CM | POA: Diagnosis not present

## 2016-06-20 DIAGNOSIS — E1142 Type 2 diabetes mellitus with diabetic polyneuropathy: Secondary | ICD-10-CM

## 2016-06-20 DIAGNOSIS — E782 Mixed hyperlipidemia: Secondary | ICD-10-CM | POA: Diagnosis not present

## 2016-06-20 DIAGNOSIS — I10 Essential (primary) hypertension: Secondary | ICD-10-CM | POA: Diagnosis not present

## 2016-06-20 MED ORDER — LIRAGLUTIDE 18 MG/3ML ~~LOC~~ SOPN: PEN_INJECTOR | SUBCUTANEOUS | 3 refills | Status: DC

## 2016-06-20 MED ORDER — ACCU-CHEK MULTICLIX LANCETS MISC: 12 refills | Status: DC

## 2016-06-20 MED ORDER — GABAPENTIN 300 MG PO CAPS: 300.0000 mg | ORAL_CAPSULE | Freq: Three times a day (TID) | ORAL | 3 refills | Status: DC

## 2016-06-20 MED ORDER — METFORMIN HCL ER 500 MG PO TB24: 500.0000 mg | ORAL_TABLET | Freq: Two times a day (BID) | ORAL | 3 refills | Status: DC

## 2016-06-20 MED ORDER — GLUCOSE BLOOD VI STRP: ORAL_STRIP | 12 refills | Status: DC

## 2016-06-20 MED ORDER — LISINOPRIL 5 MG PO TABS: 5.0000 mg | ORAL_TABLET | Freq: Every day | ORAL | 3 refills | Status: DC

## 2016-06-20 MED ORDER — INSULIN PEN NEEDLE 31G X 6 MM MISC: 1 refills | Status: DC

## 2016-06-20 MED ORDER — ROSUVASTATIN CALCIUM 10 MG PO TABS: 10.0000 mg | ORAL_TABLET | Freq: Every day | ORAL | 3 refills | Status: DC

## 2016-06-20 NOTE — Progress Notes (Signed)
Brett [name] is a 41 y.o. male is here to discuss:  History of Present Illness:   Water quality scientist, CMA, acting as scribe for Dr. Juleen China.  Chief Complaint  Patient presents with  . Follow-up  . Diabetes    New diagnosis   HPI:  Diabetes, New The patient established last week. Initial testing with A1c at 11.1. Current symptoms: No polyuria, polydipsia, blurry vision, chest pain, dyspnea or claudication.  He does endorse foot burning, which is what prompted the original A1c testing. Not on medications. No history of diabetes diagnosis.  On ACE inhibitor or angiotensin II receptor blocker?  No. On Aspirin? Not Indicated. Current diet: High in carbohydrates. No ETOH. Current exercise: He recently started exercising again.   Nephropathy screening on 06/15/16. Retinopathy screening DUE. Neuropathy screening on 06/15/16. Education NEEDED.   Previous history of cardiac disease includes: None. Cardiovascular ROS: no chest pain or dyspnea on exertion.  Hyperlidemia, New    Component Value Date/Time   CHOL 213 (H) 06/15/2016 1512   TRIG 198.0 (H) 06/15/2016 1512   HDL 40.90 06/15/2016 1512   VLDL 39.6 06/15/2016 1512   CHOLHDL 5 06/15/2016 1512   Wt Readings from Last 3 Encounters:  06/20/16 (!) 304 lb (137.9 kg)  06/16/16 (!) 308 lb (139.7 kg)  06/15/16 (!) 310 lb 12.8 oz (141 kg)   BP Readings from Last 3 Encounters:  06/20/16 134/80  06/16/16 (!) 140/110  06/15/16 (!) 142/84   Health Maintenance Due  Topic Date Due  . PNEUMOCOCCAL POLYSACCHARIDE VACCINE (1) 12/30/1976  . FOOT EXAM  12/30/1984  . OPHTHALMOLOGY EXAM  12/30/1984  . TETANUS/TDAP  12/30/1993   PMHx, SurgHx, SocialHx, FamHx, Medications, and Allergies were reviewed in the Visit Navigator and updated as appropriate.   Patient Active Problem List   Diagnosis Date Noted  . Shoulder impingement 06/16/2016  . Elevated LFTs 06/16/2016  . Mixed hyperlipidemia 06/16/2016  . Type 2 diabetes mellitus with  diabetic polyneuropathy, without long-term current use of insulin ([place]) 06/16/2016  . Bipolar 2 disorder ([place]) 06/15/2016  . Left rotator cuff tear arthropathy 06/15/2016  . Essential hypertension 06/15/2016  . Gastroesophageal reflux disease without esophagitis 06/15/2016  . Chronic seasonal allergic rhinitis due to pollen 06/15/2016  . Peripheral polyneuropathy ([place]) 06/15/2016  . Morbid obesity ([place]) 06/15/2016  . Insomnia 06/15/2016  . Chronic pain of both knees 06/15/2016  . Asthma     Social History  Substance Use Topics  . Smoking status: Never Smoker  . Smokeless tobacco: Never Used  . Alcohol use No    Current Medications and Allergies:   .  amLODipine (NORVASC) 5 MG tablet, Take 1 tablet (5 mg total) by mouth daily., Disp: 90 tablet, Rfl: 3 .  celecoxib (CELEBREX) 100 MG capsule, Take 1 capsule (100 mg total) by mouth 2 (two) times daily as needed., Disp: 60 capsule, Rfl: 2 .  cyanocobalamin 1000 MCG tablet, Take 1,000 mcg by mouth daily., Disp: , Rfl:  .  escitalopram (LEXAPRO) 20 MG tablet, Take 1 tablet (20 mg total) by mouth at bedtime., Disp: 90 tablet, Rfl: 3 .  Fexofenadine HCl (ALLEGRA ALLERGY PO), Take by mouth., Disp: , Rfl:  .  MAGNESIUM CITRATE PO, Take 1 tablet by mouth every evening., Disp: , Rfl:  .  omeprazole (PRILOSEC) 40 MG capsule, Take 1 capsule (40 mg total) by mouth daily., Disp: 30 capsule, Rfl: 3 .  zolpidem (AMBIEN) 10 MG tablet, Take 1 tablet (10 mg total) by mouth at  bedtime., Disp: 30 tablet, Rfl: 3  No Known Allergies  Review of Systems   Review of Systems  Constitutional: Negative for chills and fever.  HENT: Negative for congestion.   Eyes: Negative for blurred vision.  Respiratory: Negative for cough and shortness of breath.   Cardiovascular: Negative for chest pain and palpitations.  Gastrointestinal: Negative for abdominal pain, nausea and vomiting.  Genitourinary: Negative for frequency.  Musculoskeletal: Positive for myalgias.         Due to recent exercise.  Skin: Negative for rash.  Neurological: Negative for dizziness and loss of consciousness.    Vitals:   Vitals:   06/20/16 0820  BP: 134/80  Pulse: 79  Temp: 98.5 F (36.9 C)  TempSrc: Oral  SpO2: 97%  Weight: (!) 304 lb (137.9 kg)  Height: 6' (1.829 m)     Body mass index is 41.23 kg/m.   Physical Exam:    Physical Exam  Constitutional: He is oriented to person, place, and time. He appears well-developed and well-nourished. No distress.  HENT:  Head: Normocephalic and atraumatic.  Right Ear: External ear normal.  Left Ear: External ear normal.  Nose: Nose normal.  Mouth/Throat: Oropharynx is clear and moist.  Eyes: Conjunctivae and EOM are normal. Pupils are equal, round, and reactive to light.  Neck: Normal range of motion. Neck supple.  Cardiovascular: Normal rate, regular rhythm, normal heart sounds and intact distal pulses.   Pulmonary/Chest: Effort normal and breath sounds normal.  Abdominal: Soft. Bowel sounds are normal.  Neurological: He is alert and oriented to person, place, and time.  Skin: Skin is warm and dry.  Psychiatric: He has a normal mood and affect. His behavior is normal. Judgment and thought content normal.  Nursing note and vitals reviewed.  Results for orders placed or performed in visit on 06/15/16  Hemoglobin A1c  Result Value Ref Range   Hgb A1c MFr Bld 11.1 (H) 4.6 - 6.5 %  Comprehensive metabolic panel  Result Value Ref Range   Sodium 138 135 - 145 mEq/L   Potassium 4.5 3.5 - 5.1 mEq/L   Chloride 97 96 - 112 mEq/L   CO2 31 19 - 32 mEq/L   Glucose, Bld 216 (H) 70 - 99 mg/dL   BUN 14 6 - 23 mg/dL   Creatinine, Ser 0.72 0.40 - 1.50 mg/dL   Total Bilirubin 0.6 0.2 - 1.2 mg/dL   Alkaline Phosphatase 68 39 - 117 U/L   AST 44 (H) 0 - 37 U/L   ALT 91 (H) 0 - 53 U/L   Total Protein 7.2 6.0 - 8.3 g/dL   Albumin 4.6 3.5 - 5.2 g/dL   Calcium 10.1 8.4 - 10.5 mg/dL   GFR 127.58 >60.00 mL/min  T4, free   Result Value Ref Range   Free T4 0.85 0.60 - 1.60 ng/dL  TSH  Result Value Ref Range   TSH 2.28 0.35 - 4.50 uIU/mL  Vitamin B12  Result Value Ref Range   Vitamin B-12 1,003 (H) 211 - 911 pg/mL  VITAMIN D 25 Hydroxy (Vit-D Deficiency, Fractures)  Result Value Ref Range   VITD 19.60 (L) 30.00 - 100.00 ng/mL  Lipid panel  Result Value Ref Range   Cholesterol 213 (H) 0 - 200 mg/dL   Triglycerides 198.0 (H) 0.0 - 149.0 mg/dL   HDL 40.90 >39.00 mg/dL   VLDL 39.6 0.0 - 40.0 mg/dL   LDL Cholesterol 133 (H) 0 - 99 mg/dL   Total CHOL/HDL Ratio 5  NonHDL 172.24   CBC with Differential/Platelet  Result Value Ref Range   WBC 8.9 4.0 - 10.5 K/uL   RBC 5.70 4.22 - 5.81 Mil/uL   Hemoglobin 16.5 13.0 - 17.0 g/dL   HCT 49.0 39.0 - 52.0 %   MCV 85.9 78.0 - 100.0 fl   MCHC 33.7 30.0 - 36.0 g/dL   RDW 13.5 11.5 - 15.5 %   Platelets 186.0 150.0 - 400.0 K/uL   Neutrophils Relative % 50.0 43.0 - 77.0 %   Lymphocytes Relative 38.7 12.0 - 46.0 %   Monocytes Relative 7.4 3.0 - 12.0 %   Eosinophils Relative 2.8 0.0 - 5.0 %   Basophils Relative 1.1 0.0 - 3.0 %   Neutro Abs 4.5 1.4 - 7.7 K/uL   Lymphs Abs 3.4 0.7 - 4.0 K/uL   Monocytes Absolute 0.7 0.1 - 1.0 K/uL   Eosinophils Absolute 0.2 0.0 - 0.7 K/uL   Basophils Absolute 0.1 0.0 - 0.1 K/uL    Assessment and Plan:    Malikiah was seen today for follow-up and diabetes.  Diagnoses and all orders for this visit:  Type 2 diabetes mellitus with diabetic polyneuropathy, without long-term current use of insulin (HCC) -     metFORMIN (GLUCOPHAGE XR) 500 MG 24 hr tablet; Take 1 tablet (500 mg total) by mouth 2 (two) times daily. -     liraglutide 18 MG/3ML SOPN; 0.6 mg [place] q day x 1 week, then 1.2 mg [place] daily -     Referral to Nutrition and Diabetes Services -     Ambulatory referral to Ophthalmology -     Lancets ([place]) lancets; Accu-Chek Guide - Check blood sugar 3 times per day. -     glucose blood test strip; Accu-Chek Guide -  Check blood sugar 3 times per day.  Essential hypertension -     lisinopril (PRINIVIL,ZESTRIL) 5 MG tablet; Take 1 tablet (5 mg total) by mouth daily.  Mixed hyperlipidemia -     rosuvastatin (CRESTOR) 10 MG tablet; Take 1 tablet (10 mg total) by mouth daily.  Morbid obesity ([place]) Comments: The patient is asked to make an attempt to improve diet and exercise patterns to aid in medical management of this problem.  Peripheral polyneuropathy (HCC) -     gabapentin (NEURONTIN) 300 MG capsule; Take 1 capsule (300 mg total) by mouth 3 (three) times daily. TAKE AT NIGHT UNTIL TOLERATING, THEN MAY INCREASE TO DAYTIME   . Reviewed expectations re: course of current medical issues. . Discussed self-management of symptoms. . Outlined signs and symptoms indicating need for more acute intervention. . Patient verbalized understanding and all questions were answered. . See orders for this visit as documented in the electronic medical record. . Patient received an After Visit Summary.  CMA served as Education administrator during this visit. History, Physical, and Plan performed by medical provider. Documentation and orders reviewed and attested to. Briscoe Deutscher, D.O.  Briscoe Deutscher, [place] 06/24/2016  Follow-up: Return in about 4 weeks (around 07/18/2016).

## 2016-06-20 NOTE — Patient Instructions (Signed)
Recheck in 2-4 weeks.

## 2016-06-20 NOTE — Progress Notes (Signed)
Pre visit review using our clinic review tool, if applicable. No additional management support is needed unless otherwise documented below in the visit note. 

## 2016-06-20 NOTE — Telephone Encounter (Signed)
[phone] [place], [place] [phone] (Phone) [phone] (Fax)   Called to advise that they need to know on the rx that was placed for the glucose strips: what type and how many times a day?  Please call pharmacy to advise.

## 2016-06-20 NOTE — Telephone Encounter (Signed)
Spoke with pharmacy.  Test strips must state the type of test strips and how many times per day the patient is going to use the strips.  Re-sent Rx to patient's pharmacy.

## 2016-06-21 ENCOUNTER — Encounter: Payer: Self-pay | Admitting: Family Medicine

## 2016-07-05 ENCOUNTER — Encounter: Payer: Self-pay | Admitting: Family Medicine

## 2016-07-05 DIAGNOSIS — G4700 Insomnia, unspecified: Secondary | ICD-10-CM

## 2016-07-06 MED ORDER — LORAZEPAM 1 MG PO TABS: ORAL_TABLET | ORAL | 0 refills | Status: DC

## 2016-07-14 ENCOUNTER — Ambulatory Visit: Payer: BC Managed Care – PPO | Admitting: Sports Medicine

## 2016-07-15 ENCOUNTER — Telehealth: Payer: Self-pay

## 2016-07-15 NOTE — Telephone Encounter (Signed)
PA for Ambien initiated through Cover My Meds.  Awaiting response.

## 2016-07-15 NOTE — Telephone Encounter (Signed)
PA approved.  Approval notice faxed to pharmacy.

## 2016-07-18 ENCOUNTER — Encounter: Payer: Self-pay | Admitting: Family Medicine

## 2016-07-18 ENCOUNTER — Ambulatory Visit (INDEPENDENT_AMBULATORY_CARE_PROVIDER_SITE_OTHER): Payer: BC Managed Care – PPO | Admitting: Family Medicine

## 2016-07-18 VITALS — BP 118/82 | HR 86 | Temp 98.2°F | Ht 72.0 in | Wt 299.0 lb

## 2016-07-18 DIAGNOSIS — G629 Polyneuropathy, unspecified: Secondary | ICD-10-CM

## 2016-07-18 DIAGNOSIS — F99 Mental disorder, not otherwise specified: Secondary | ICD-10-CM | POA: Diagnosis not present

## 2016-07-18 DIAGNOSIS — F5105 Insomnia due to other mental disorder: Secondary | ICD-10-CM | POA: Diagnosis not present

## 2016-07-18 DIAGNOSIS — E1142 Type 2 diabetes mellitus with diabetic polyneuropathy: Secondary | ICD-10-CM | POA: Diagnosis not present

## 2016-07-18 NOTE — Progress Notes (Signed)
Brett [name] is a 41 y.o. male is here to discuss:  History of Present Illness:   Brett [name] CMA acting as scribe for Dr. Juleen China.  Chief Complaint  Patient presents with  . Diabetes    follow up    HPI:  1. Type 2 diabetes mellitus: Current symptoms: No polyuria, polydipsia, blurry vision, chest pain, dyspnea or claudication.  No foot burning, numbness or pain. Taking medication compliantly without noted sided effects. Home glucose monitoring in the range of 94-145. No episodes of hypoglycemia. On ACE inhibitor or angiotensin II receptor blocker?  Yes. Current diet: in general, a "healthy" diet.  Current exercise: aerobics and weightlifting.   Retinopathy screening: May 2018 Education: May 2018   2. Insomnia. Improved with Ativan at night. No daytime sedation.   3. Morbid obesity ([place]). Down five pounds. Exercising 4-5 days per week. Eating low carbohydrate meals 75% of the time.   4. Peripheral polyneuropathy. Improved moderately with Neurontin.    Health Maintenance Due  Topic Date Due  . PNEUMOCOCCAL POLYSACCHARIDE VACCINE (1) 12/30/1976  . OPHTHALMOLOGY EXAM  12/30/1984  . TETANUS/TDAP  12/30/1993   PMHx, SurgHx, SocialHx, FamHx, Medications, and Allergies were reviewed in the Visit Navigator and updated as appropriate.   Patient Active Problem List   Diagnosis Date Noted  . Shoulder impingement 06/16/2016  . Elevated LFTs 06/16/2016  . Mixed hyperlipidemia 06/16/2016  . Type 2 diabetes mellitus with diabetic polyneuropathy, without long-term current use of insulin ([place]) 06/16/2016  . Bipolar 2 disorder ([place]) 06/15/2016  . Left rotator cuff tear arthropathy 06/15/2016  . Essential hypertension 06/15/2016  . Gastroesophageal reflux disease without esophagitis 06/15/2016  . Chronic seasonal allergic rhinitis due to pollen 06/15/2016  . Peripheral polyneuropathy 06/15/2016  . Morbid obesity ([place]) 06/15/2016  . Insomnia 06/15/2016  . Chronic pain of both  knees 06/15/2016  . Asthma    Social History  Substance Use Topics  . Smoking status: Never Smoker  . Smokeless tobacco: Never Used  . Alcohol use No   Current Medications and Allergies:   .  amLODipine (NORVASC) 5 MG tablet, Take 1 tablet (5 mg total) by mouth daily., Disp: 90 tablet, Rfl: 3 .  celecoxib (CELEBREX) 100 MG capsule, Take 1 capsule (100 mg total) by mouth 2 (two) times daily as needed., Disp: 60 capsule, Rfl: 2 .  cyanocobalamin 1000 MCG tablet, Take 1,000 mcg by mouth daily., Disp: , Rfl:  .  escitalopram (LEXAPRO) 20 MG tablet, Take 1 tablet (20 mg total) by mouth at bedtime., Disp: 90 tablet, Rfl: 3 .  Fexofenadine HCl (ALLEGRA ALLERGY PO), Take by mouth., Disp: , Rfl:  .  gabapentin (NEURONTIN) 300 MG capsule, Take 1 capsule (300 mg total) by mouth 3 (three) times daily. TAKE AT NIGHT UNTIL TOLERATING, THEN MAY INCREASE TO DAYTIME, Disp: 90 capsule, Rfl: 3 .  glucose blood test strip, Accu-Chek Guide - Check blood sugar 3 times per day., Disp: 100 each, Rfl: 12 .  Insulin Pen Needle 31G X 6 MM MISC, Use to inject liraglutide daily, Disp: 50 each, Rfl: 1 .  Lancets (ACCU-CHEK MULTICLIX) lancets, Accu-Chek Guide - Check blood sugar 3 times per day., Disp: 100 each, Rfl: 12 .  liraglutide 18 MG/3ML SOPN, 0.6 mg [place] q day x 1 week, then 1.2 mg [place] daily, Disp: 1 pen, Rfl: 3 .  lisinopril (PRINIVIL,ZESTRIL) 5 MG tablet, Take 1 tablet (5 mg total) by mouth daily., Disp: 90 tablet, Rfl: 3 .  LORazepam (ATIVAN)  1 MG tablet, 1-2 po q hs prn insomnia for short-term treatment, Disp: 40 tablet, Rfl: 0 .  MAGNESIUM CITRATE PO, Take 1 tablet by mouth every evening., Disp: , Rfl:  .  metFORMIN (GLUCOPHAGE XR) 500 MG 24 hr tablet, Take 1 tablet (500 mg total) by mouth 2 (two) times daily., Disp: 60 tablet, Rfl: 3 .  omeprazole (PRILOSEC) 40 MG capsule, Take 1 capsule (40 mg total) by mouth daily., Disp: 30 capsule, Rfl: 3 .  rosuvastatin (CRESTOR) 10 MG tablet, Take 1 tablet (10 mg total)  by mouth daily., Disp: 30 tablet, Rfl: 3 .  zolpidem (AMBIEN) 10 MG tablet, Take 1 tablet (10 mg total) by mouth at bedtime., Disp: 30 tablet, Rfl: 3  No Known Allergies   Review of Systems   Review of Systems  Constitutional: Negative for chills and fever.  HENT: Negative for congestion, ear pain, sinus pain and sore throat.   Eyes: Negative for blurred vision and double vision.  Respiratory: Negative for cough, shortness of breath and wheezing.   Cardiovascular: Negative for chest pain, palpitations and leg swelling.  Gastrointestinal: Negative for abdominal pain, constipation, diarrhea and vomiting.  Genitourinary: Negative for dysuria.  Musculoskeletal: Positive for joint pain. Negative for back pain and neck pain.  Neurological: Negative for dizziness and headaches.  Psychiatric/Behavioral: Positive for depression. Negative for hallucinations and memory loss.   Vitals:   Vitals:   07/18/16 0732  BP: 118/82  Pulse: 86  Temp: 98.2 F (36.8 C)  TempSrc: Oral  SpO2: 96%  Weight: 299 lb (135.6 kg)  Height: 6' (1.829 m)     Body mass index is 40.55 kg/m.   Physical Exam:    Physical Exam  Constitutional: He is oriented to person, place, and time. He appears well-developed and well-nourished. No distress.  HENT:  Head: Normocephalic and atraumatic.  Right Ear: External ear normal.  Left Ear: External ear normal.  Nose: Nose normal.  Mouth/Throat: Oropharynx is clear and moist.  Eyes: Conjunctivae and EOM are normal. Pupils are equal, round, and reactive to light.  Neck: Normal range of motion. Neck supple.  Cardiovascular: Normal rate, regular rhythm, normal heart sounds and intact distal pulses.   Pulmonary/Chest: Effort normal and breath sounds normal.  Abdominal: Soft. Bowel sounds are normal.  Musculoskeletal: Normal range of motion.  Neurological: He is alert and oriented to person, place, and time.  Skin: Skin is warm and dry.  Psychiatric: He has a normal  mood and affect. His behavior is normal. Judgment and thought content normal.  Nursing note and vitals reviewed.    Assessment and Plan:    Brett [name] was seen today for diabetes.  Diagnoses and all orders for this visit:  Type 2 diabetes mellitus with diabetic polyneuropathy, without long-term current use of insulin ([place]) Comments: Doing very well on current regimen. Repeat A1c in 2 months.  Insomnia due to other mental disorder Comments: Continue Ativan. Okay to refill when patient call in a few weeks. Plan to get on 3 month schedule.  Morbid obesity ([place]) Comments: Continue working on [employer].  Peripheral polyneuropathy Comments: Improving. Okay to increase to BID dosing.   . Reviewed expectations re: course of current medical issues. . Discussed self-management of symptoms. . Outlined signs and symptoms indicating need for more acute intervention. . Patient verbalized understanding and all questions were answered. . See orders for this visit as documented in the electronic medical record. . Patient received an After Visit Summary.   CMA  served as Education administrator during this visit. History, Physical, and Plan performed by medical provider. Documentation and orders reviewed and attested to. Briscoe Deutscher, D.O.  Briscoe Deutscher, [place] 07/18/2016  Follow-up: 2 months.

## 2016-07-18 NOTE — Patient Instructions (Signed)
It is okay to increase the Neurontin to twice daily.   Call when you are due for a refill of the Ativan.

## 2016-07-18 NOTE — Progress Notes (Signed)
Pre visit review using our clinic review tool, if applicable. No additional management support is needed unless otherwise documented below in the visit note. 

## 2016-07-25 ENCOUNTER — Other Ambulatory Visit: Payer: Self-pay | Admitting: Family Medicine

## 2016-07-25 DIAGNOSIS — G4700 Insomnia, unspecified: Secondary | ICD-10-CM

## 2016-07-26 ENCOUNTER — Other Ambulatory Visit: Payer: Self-pay

## 2016-07-26 DIAGNOSIS — G4700 Insomnia, unspecified: Secondary | ICD-10-CM

## 2016-07-26 MED ORDER — LORAZEPAM 1 MG PO TABS: ORAL_TABLET | ORAL | 1 refills | Status: DC

## 2016-07-26 NOTE — Telephone Encounter (Signed)
Please give Rx to get to next visit. Plan is to put in 3 months worth at that point.

## 2016-07-26 NOTE — Telephone Encounter (Signed)
Rx called in to patient's pharmacy.

## 2016-07-26 NOTE — Telephone Encounter (Signed)
Please advise 

## 2016-07-26 NOTE — Telephone Encounter (Signed)
Okay 

## 2016-07-29 ENCOUNTER — Encounter: Payer: Self-pay | Admitting: Registered"

## 2016-07-29 ENCOUNTER — Encounter: Payer: BC Managed Care – PPO | Attending: Family Medicine | Admitting: Registered"

## 2016-07-29 DIAGNOSIS — E1142 Type 2 diabetes mellitus with diabetic polyneuropathy: Secondary | ICD-10-CM | POA: Insufficient documentation

## 2016-07-29 DIAGNOSIS — Z713 Dietary counseling and surveillance: Secondary | ICD-10-CM | POA: Insufficient documentation

## 2016-07-29 NOTE — Patient Instructions (Addendum)
Ideas for better sleep:  Get a bedtime routine to help wind down  Avoid caffeine later in the day including coffee, soda, black tea, green tea, chocolate   Consider Melatonin Extended Released supplement  (check with pharmacist to make sure there are no contraindications) Magnesium supplement in the evening may help- Calm anti-stress drink, take minimal amount to begin, can also help with constipation  Avoid watching TV or looking at the computer 1 hr before going to sleep  Start taking Vitamin D again  Avoid skipping meals Aim for 3-4 Carb Choices per meal (45-60 grams) +/- 1 either way  Aim for 0-2 Carbs per snack if hungry balanced with protein Include protein in moderation with your meals and snacks Consider reading food labels for Total Carbohydrate  Continue your activity level as tolerated Continue checking BG at alternate times per day as directed by MD and take information to your doctors  Consider taking medication as directed by MD

## 2016-07-29 NOTE — Progress Notes (Signed)
Diabetes Self-Management Education  Visit Type: First/Initial  Appt. Start Time: 0900 Appt. End Time: 4097  07/29/2016  Mr. Brett [name], identified by name and date of birth, is a 41 y.o. male with a diagnosis of Diabetes: Type 2.   ASSESSMENT Since diagnosis Pt states making lifestyle changes including cutting out regular soda, limiting carbs, and exercising regularly. Pt states his fasting BG has decreased from the 200s to now it is in the 80-low 100's.  Pt states he has not slept well since college and only gets 3-4 hrs night. 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  Per charge labs: abnormal Vit D (low)  Patient reports he takes all his medications and doesn't pay attention to what they are because he trusts his doctor to prescribe what is needed.      Diabetes Self-Management Education - 07/29/16 0914      Visit Information   Visit Type First/Initial     Initial Visit   Diabetes Type Type 2   Are you currently following a meal plan? No   Are you taking your medications as prescribed? Yes   Date Diagnosed approx 2 months     Health Coping   How would you rate your overall health? Good     Psychosocial Assessment   Patient Belief/Attitude about Diabetes Other (comment)  pissed off   How often do you need to have someone help you when you read instructions, pamphlets, or other written materials from your doctor or pharmacy? 1 - Never   What is the last grade level you completed in school? Graduate, [employer]     Complications   Last HgB A1C per patient/outside source 11.1 %  3/21   How often do you check your blood sugar? 1-2 times/day   Fasting Blood glucose range (mg/dL) 70-129  86-104   Postprandial Blood glucose range (mg/dL) --  low hundreds (not sure how long after eating)   Number of hypoglycemic episodes per month 0   Number of hyperglycemic episodes per week 0   Have you had a  dilated eye exam in the past 12 months? No   Have you had a dental exam in the past 12 months? No   Are you checking your feet? Yes   How many days per week are you checking your feet? 3     Dietary Intake   Breakfast coffee, cream   Snack (morning) none    Lunch coffee, pineapple with cottage cheese OR left overs   Snack (afternoon) coffee   Dinner meat, sweet potato, carrots   Snack (evening) popcorn OR rice cakes   Beverage(s) water, coffee, occassionally diet coke     Exercise   Exercise Type Moderate (swimming / aerobic walking)   How many days per week to you exercise? 4   How many minutes per day do you exercise? 60   Total minutes per week of exercise 240     Patient Education   Previous Diabetes Education No   Disease state  Definition of diabetes, type 1 and 2, and the diagnosis of diabetes;Factors that contribute to the development of diabetes   Nutrition management  Role of diet in the treatment of diabetes and the relationship between the three main macronutrients and blood glucose level;Food label reading, portion sizes and measuring food.;Carbohydrate counting   Physical activity and exercise  Role of exercise on diabetes management, blood pressure control  and cardiac health.   Monitoring Identified appropriate SMBG and/or A1C goals.;Daily foot exams   Acute complications Taught treatment of hypoglycemia - the 15 rule.   Chronic complications Relationship between chronic complications and blood glucose control;Assessed and discussed foot care and prevention of foot problems;Lipid levels, blood glucose control and heart disease     Individualized Goals (developed by patient)   Nutrition Follow meal plan discussed     Outcomes   Expected Outcomes Demonstrated interest in learning. Expect positive outcomes   Future DMSE PRN   Program Status Completed      Individualized Plan for Diabetes Self-Management Training:   Learning Objective:  Patient will have a greater  understanding of diabetes self-management. Patient education plan is to attend individual and/or group sessions per assessed needs and concerns.   Plan:   Patient Instructions  Ideas for better sleep:  Get a bedtime routine to help wind down  Avoid caffeine later in the day including coffee, soda, black tea, green tea, chocolate   Consider Melatonin Extended Released supplement  (check with pharmacist to make sure there are no contraindications) Magnesium supplement in the evening may help- Calm anti-stress drink, take minimal amount to begin, can also help with constipation  Avoid watching TV or looking at the computer 1 hr before going to sleep  Start taking Vitamin D again  Avoid skipping meals Aim for 3-4 Carb Choices per meal (45-60 grams) +/- 1 either way  Aim for 0-2 Carbs per snack if hungry balanced with protein Include protein in moderation with your meals and snacks Consider reading food labels for Total Carbohydrate  Continue your activity level as tolerated Continue checking BG at alternate times per day as directed by MD and take information to your doctors  Consider taking medication as directed by MD  Expected Outcomes:  Demonstrated interest in learning. Expect positive outcomes  Education material provided: Living Well with Diabetes, Food label handouts, A1C conversion sheet, My Plate, Snack sheet and Carbohydrate counting sheet  If problems or questions, patient to contact team via:  Phone, mychart  Future DSME appointment: PRN

## 2016-08-10 ENCOUNTER — Encounter: Payer: Self-pay | Admitting: Family Medicine

## 2016-09-05 ENCOUNTER — Encounter: Payer: Self-pay | Admitting: Family Medicine

## 2016-09-05 ENCOUNTER — Other Ambulatory Visit: Payer: Self-pay | Admitting: Sports Medicine

## 2016-09-05 ENCOUNTER — Other Ambulatory Visit: Payer: Self-pay | Admitting: Family Medicine

## 2016-09-05 DIAGNOSIS — G4700 Insomnia, unspecified: Secondary | ICD-10-CM

## 2016-09-06 NOTE — Telephone Encounter (Signed)
Per last office note, ok to refill this medication.

## 2016-09-16 ENCOUNTER — Ambulatory Visit (INDEPENDENT_AMBULATORY_CARE_PROVIDER_SITE_OTHER): Payer: BC Managed Care – PPO | Admitting: Family Medicine

## 2016-09-16 ENCOUNTER — Encounter: Payer: Self-pay | Admitting: Family Medicine

## 2016-09-16 VITALS — BP 110/66 | HR 86 | Temp 98.0°F | Ht 72.0 in | Wt 301.2 lb

## 2016-09-16 DIAGNOSIS — I10 Essential (primary) hypertension: Secondary | ICD-10-CM

## 2016-09-16 DIAGNOSIS — Z23 Encounter for immunization: Secondary | ICD-10-CM | POA: Diagnosis not present

## 2016-09-16 DIAGNOSIS — E1142 Type 2 diabetes mellitus with diabetic polyneuropathy: Secondary | ICD-10-CM | POA: Diagnosis not present

## 2016-09-16 LAB — POCT GLYCOSYLATED HEMOGLOBIN (HGB A1C): Hemoglobin A1C: 6

## 2016-09-16 MED ORDER — SILVER SULFADIAZINE 1 % EX CREA: 1.0000 "application " | TOPICAL_CREAM | Freq: Every day | CUTANEOUS | 0 refills | Status: DC

## 2016-09-16 MED ORDER — PREGABALIN 50 MG PO CAPS: 50.0000 mg | ORAL_CAPSULE | Freq: Two times a day (BID) | ORAL | 3 refills | Status: DC

## 2016-09-16 NOTE — Progress Notes (Signed)
Brett [name] is a 41 y.o. male is here for follow up.  History of Present Illness:   Shaune Pascal CMA acting as scribe for Dr. Juleen China.  HPI:  Diabetes Current symptoms: no polyuria or polydipsia, no chest pain, dyspnea or TIA's, has dysesthesias in the feet.  Taking medication compliantly without noted sided effects [x]   YES  []   NO  Home glucose monitoring in the range of  78-129.  Episodes of hypoglycemia? []   YES  [x]   NO Maintaining a diabetic diet? [x]   YES  []   NO Trying to exercise on a regular basis? []   YES  [x]   NO  On ACE inhibitor or angiotensin II receptor blocker? [x]   YES  []   NO On Aspirin? []   YES  [x]   NO  Lab Results  Component Value Date   HGBA1C 6.0 09/16/2016    No results found for: Derl Barrow  Lab Results  Component Value Date   CHOL 213 (H) 06/15/2016   HDL 40.90 06/15/2016   LDLCALC 133 (H) 06/15/2016   TRIG 198.0 (H) 06/15/2016   CHOLHDL 5 06/15/2016     Wt Readings from Last 3 Encounters:  09/16/16 (!) 301 lb 3.2 oz (136.6 kg)  07/18/16 299 lb (135.6 kg)  06/20/16 (!) 304 lb (137.9 kg)    reports that he has never smoked. He has never used smokeless tobacco. BP Readings from Last 3 Encounters:  09/16/16 110/66  07/18/16 118/82  06/20/16 134/80   Lab Results  Component Value Date   CREATININE 0.72 06/15/2016   Hypertension Home blood pressure readings at goal.  Avoiding excessive salt intake? [x]   YES  []   NO Trying to exercise on a regular basis? [x]   YES  []   NO Review: taking medications as instructed, no medication side effects noted, no TIAs, no chest pain on exertion, no dyspnea on exertion, no swelling of ankles.   Health Maintenance Due  Topic Date Due  . PNEUMOCOCCAL POLYSACCHARIDE VACCINE (1) 12/30/1976  . OPHTHALMOLOGY EXAM  12/30/1984  . TETANUS/TDAP  12/30/1993   PMHx, SurgHx, SocialHx, FamHx, Medications, and Allergies were reviewed in the Visit Navigator and updated as appropriate.    Patient Active Problem List   Diagnosis Date Noted  . Shoulder impingement 06/16/2016  . Elevated LFTs 06/16/2016  . Mixed hyperlipidemia 06/16/2016  . Type 2 diabetes mellitus with diabetic polyneuropathy, without long-term current use of insulin ([place]) 06/16/2016  . Bipolar 2 disorder ([place]) 06/15/2016  . Left rotator cuff tear arthropathy 06/15/2016  . Essential hypertension 06/15/2016  . Gastroesophageal reflux disease without esophagitis 06/15/2016  . Chronic seasonal allergic rhinitis due to pollen 06/15/2016  . Peripheral polyneuropathy 06/15/2016  . Morbid obesity ([place]) 06/15/2016  . Insomnia 06/15/2016  . Chronic pain of both knees 06/15/2016  . Asthma    Social History  Substance Use Topics  . Smoking status: Never Smoker  . Smokeless tobacco: Never Used  . Alcohol use No   Current Medications and Allergies:   Current Outpatient Prescriptions:  .  amLODipine (NORVASC) 5 MG tablet, Take 1 tablet (5 mg total) by mouth daily., Disp: 90 tablet, Rfl: 3 .  celecoxib (CELEBREX) 100 MG capsule, TAKE 1 CAPSULE (100 MG TOTAL) BY MOUTH 2 (TWO) TIMES DAILY AS NEEDED., Disp: 60 capsule, Rfl: 1 .  cyanocobalamin 1000 MCG tablet, Take 1,000 mcg by mouth daily., Disp: , Rfl:  .  escitalopram (LEXAPRO) 20 MG tablet, Take 1 tablet (20 mg total) by mouth at bedtime.,  Disp: 90 tablet, Rfl: 3 .  Fexofenadine HCl (ALLEGRA ALLERGY PO), Take by mouth., Disp: , Rfl:  .  gabapentin (NEURONTIN) 300 MG capsule, Take 1 capsule (300 mg total) by mouth 3 (three) times daily. TAKE AT NIGHT UNTIL TOLERATING, THEN MAY INCREASE TO DAYTIME, Disp: 90 capsule, Rfl: 3 .  glucose blood test strip, Accu-Chek Guide - Check blood sugar 3 times per day., Disp: 100 each, Rfl: 12 .  Insulin Pen Needle 31G X 6 MM MISC, Use to inject liraglutide daily, Disp: 50 each, Rfl: 1 .  Lancets (ACCU-CHEK MULTICLIX) lancets, Accu-Chek Guide - Check blood sugar 3 times per day., Disp: 100 each, Rfl: 12 .  liraglutide 18 MG/3ML  SOPN, 0.6 mg [place] q day x 1 week, then 1.2 mg [place] daily, Disp: 1 pen, Rfl: 3 .  lisinopril (PRINIVIL,ZESTRIL) 5 MG tablet, Take 1 tablet (5 mg total) by mouth daily., Disp: 90 tablet, Rfl: 3 .  LORazepam (ATIVAN) 1 MG tablet, TAKE 1 TO 2 TABLETS BY MOUTH AT BEDTIME AS NEEDED FOR INSOMNIA, Disp: 40 tablet, Rfl: 1 .  MAGNESIUM CITRATE PO, Take 1 tablet by mouth every evening., Disp: , Rfl:  .  metFORMIN (GLUCOPHAGE XR) 500 MG 24 hr tablet, Take 1 tablet (500 mg total) by mouth 2 (two) times daily., Disp: 60 tablet, Rfl: 3 .  omeprazole (PRILOSEC) 40 MG capsule, Take 1 capsule (40 mg total) by mouth daily., Disp: 30 capsule, Rfl: 3 .  rosuvastatin (CRESTOR) 10 MG tablet, Take 1 tablet (10 mg total) by mouth daily., Disp: 30 tablet, Rfl: 3 .  zolpidem (AMBIEN) 10 MG tablet, Take 1 tablet (10 mg total) by mouth at bedtime., Disp: 30 tablet, Rfl: 3  No Known Allergies   Review of Systems   Review of Systems  Constitutional: Negative for chills, fever and malaise/fatigue.  HENT: Negative for ear pain, sinus pain and sore throat.   Eyes: Negative for blurred vision and double vision.  Respiratory: Negative for cough, shortness of breath and wheezing.   Cardiovascular: Negative for chest pain, palpitations and leg swelling.  Gastrointestinal: Negative for abdominal pain, nausea and vomiting.  Musculoskeletal: Negative for back pain, joint pain and neck pain.  Neurological: Negative for dizziness and headaches.  Psychiatric/Behavioral: Negative for depression, hallucinations and memory loss.   Vitals:   Vitals:   09/16/16 0727  BP: 110/66  Pulse: 86  Temp: 98 F (36.7 C)  TempSrc: Oral  SpO2: 95%  Weight: (!) 301 lb 3.2 oz (136.6 kg)  Height: 6' (1.829 m)     Body mass index is 40.85 kg/m.   Physical Exam:   Physical Exam  Constitutional: He is oriented to person, place, and time. He appears well-developed and well-nourished. No distress.  HENT:  Head: Normocephalic and atraumatic.    Right Ear: External ear normal.  Left Ear: External ear normal.  Nose: Nose normal.  Mouth/Throat: Oropharynx is clear and moist.  Eyes: Conjunctivae and EOM are normal. Pupils are equal, round, and reactive to light.  Neck: Normal range of motion. Neck supple.  Cardiovascular: Normal rate, regular rhythm, normal heart sounds and intact distal pulses.   Pulmonary/Chest: Effort normal and breath sounds normal.  Abdominal: Soft. Bowel sounds are normal.  Musculoskeletal: Normal range of motion.  Neurological: He is alert and oriented to person, place, and time.  Skin: Skin is warm. Rash noted.  Psychiatric: He has a normal mood and affect. His behavior is normal. Judgment and thought content normal.  Nursing note  and vitals reviewed.   Results for orders placed or performed in visit on 09/16/16  POCT glycosylated hemoglobin (Hb A1C)  Result Value Ref Range   Hemoglobin A1C 6.0    Assessment and Plan:   Zayquan was seen today for follow-up.  Diagnoses and all orders for this visit:  Type 2 diabetes mellitus with diabetic polyneuropathy, without long-term current use of insulin ([place]) Comments: Doing VERY well. A1c down from 11.1 to 6.0 in 3 months. Continue current treatment.  Orders: -     POCT glycosylated hemoglobin (Hb A1C) -     pregabalin (LYRICA) 50 MG capsule; Take 1 capsule (50 mg total) by mouth 2 (two) times daily. -     Pneumococcal polysaccharide vaccine 23-valent greater than or equal to 2yo subcutaneous/IM  Essential hypertension Comments: Doing well. Continue current treatment.    . Reviewed expectations re: course of current medical issues. . Discussed self-management of symptoms. . Outlined signs and symptoms indicating need for more acute intervention. . Patient verbalized understanding and all questions were answered. Marland Kitchen Health Maintenance issues including appropriate healthy diet, exercise, and smoking avoidance were discussed with patient. . See orders for  this visit as documented in the electronic medical record. . Patient received an After Visit Summary.  CMA served as Education administrator during this visit. History, Physical, and Plan performed by medical provider. The above documentation has been reviewed and is accurate and complete. Briscoe Deutscher, D.O.  Briscoe Deutscher, DO [place] 09/16/2016  Future Appointments Date Time Provider [place]  12/19/2016 7:30 AM Briscoe Deutscher, DO [facility] None

## 2016-10-02 ENCOUNTER — Other Ambulatory Visit: Payer: Self-pay | Admitting: Family Medicine

## 2016-10-12 ENCOUNTER — Other Ambulatory Visit: Payer: Self-pay | Admitting: Family Medicine

## 2016-10-12 DIAGNOSIS — E782 Mixed hyperlipidemia: Secondary | ICD-10-CM

## 2016-10-12 DIAGNOSIS — E1142 Type 2 diabetes mellitus with diabetic polyneuropathy: Secondary | ICD-10-CM

## 2016-10-30 ENCOUNTER — Other Ambulatory Visit: Payer: Self-pay | Admitting: Sports Medicine

## 2016-11-07 ENCOUNTER — Encounter: Payer: Self-pay | Admitting: Family Medicine

## 2016-11-08 ENCOUNTER — Encounter: Payer: Self-pay | Admitting: Family Medicine

## 2016-11-09 ENCOUNTER — Other Ambulatory Visit: Payer: Self-pay

## 2016-11-09 DIAGNOSIS — G629 Polyneuropathy, unspecified: Secondary | ICD-10-CM

## 2016-11-09 MED ORDER — PREGABALIN 100 MG PO CAPS: 100.0000 mg | ORAL_CAPSULE | Freq: Three times a day (TID) | ORAL | 0 refills | Status: DC

## 2016-11-22 ENCOUNTER — Other Ambulatory Visit: Payer: Self-pay | Admitting: Family Medicine

## 2016-11-22 NOTE — Telephone Encounter (Signed)
Please advise on refill.

## 2016-11-22 NOTE — Telephone Encounter (Signed)
Okay 

## 2016-11-29 ENCOUNTER — Other Ambulatory Visit: Payer: Self-pay | Admitting: Family Medicine

## 2016-11-29 DIAGNOSIS — R202 Paresthesia of skin: Secondary | ICD-10-CM

## 2016-11-30 ENCOUNTER — Encounter: Payer: Self-pay | Admitting: Family Medicine

## 2016-11-30 ENCOUNTER — Other Ambulatory Visit: Payer: Self-pay | Admitting: Sports Medicine

## 2016-11-30 ENCOUNTER — Other Ambulatory Visit: Payer: Self-pay

## 2016-11-30 MED ORDER — ALBUTEROL SULFATE HFA 108 (90 BASE) MCG/ACT IN AERS: 2.0000 | INHALATION_SPRAY | Freq: Four times a day (QID) | RESPIRATORY_TRACT | 0 refills | Status: DC | PRN

## 2016-11-30 NOTE — Telephone Encounter (Signed)
Please advise 

## 2016-11-30 NOTE — Telephone Encounter (Signed)
Okay to refill? 

## 2016-11-30 NOTE — Telephone Encounter (Signed)
Refill faxed to patient's pharmacy.

## 2016-12-15 ENCOUNTER — Other Ambulatory Visit: Payer: Self-pay | Admitting: Family Medicine

## 2016-12-15 LAB — HM DIABETES EYE EXAM

## 2016-12-19 ENCOUNTER — Ambulatory Visit (INDEPENDENT_AMBULATORY_CARE_PROVIDER_SITE_OTHER): Payer: BC Managed Care – PPO | Admitting: Family Medicine

## 2016-12-19 ENCOUNTER — Encounter: Payer: Self-pay | Admitting: Family Medicine

## 2016-12-19 VITALS — BP 118/82 | HR 81 | Temp 98.1°F | Ht 72.0 in | Wt 313.0 lb

## 2016-12-19 DIAGNOSIS — I10 Essential (primary) hypertension: Secondary | ICD-10-CM | POA: Diagnosis not present

## 2016-12-19 DIAGNOSIS — Z23 Encounter for immunization: Secondary | ICD-10-CM | POA: Diagnosis not present

## 2016-12-19 DIAGNOSIS — E782 Mixed hyperlipidemia: Secondary | ICD-10-CM

## 2016-12-19 DIAGNOSIS — E1142 Type 2 diabetes mellitus with diabetic polyneuropathy: Secondary | ICD-10-CM | POA: Diagnosis not present

## 2016-12-19 DIAGNOSIS — R945 Abnormal results of liver function studies: Secondary | ICD-10-CM

## 2016-12-19 DIAGNOSIS — G629 Polyneuropathy, unspecified: Secondary | ICD-10-CM | POA: Diagnosis not present

## 2016-12-19 DIAGNOSIS — F5101 Primary insomnia: Secondary | ICD-10-CM

## 2016-12-19 DIAGNOSIS — R7989 Other specified abnormal findings of blood chemistry: Secondary | ICD-10-CM | POA: Diagnosis not present

## 2016-12-19 LAB — COMPREHENSIVE METABOLIC PANEL
ALT: 28 U/L (ref 0–53)
AST: 29 U/L (ref 0–37)
Albumin: 4.6 g/dL (ref 3.5–5.2)
Alkaline Phosphatase: 48 U/L (ref 39–117)
BUN: 15 mg/dL (ref 6–23)
CO2: 32 mEq/L (ref 19–32)
Calcium: 9.7 mg/dL (ref 8.4–10.5)
Chloride: 101 mEq/L (ref 96–112)
Creatinine, Ser: 0.8 mg/dL (ref 0.40–1.50)
GFR: 112.69 mL/min (ref >60.00)
Glucose, Bld: 93 mg/dL (ref 70–99)
Potassium: 5 mEq/L (ref 3.5–5.1)
Sodium: 142 mEq/L (ref 135–145)
Total Bilirubin: 0.5 mg/dL (ref 0.2–1.2)
Total Protein: 7.1 g/dL (ref 6.0–8.3)

## 2016-12-19 LAB — LIPID PANEL
Cholesterol: 150 mg/dL (ref 0–200)
HDL: 38.5 mg/dL — ABNORMAL LOW (ref >39.00)
LDL Cholesterol: 84 mg/dL (ref 0–99)
NonHDL: 111.36
Total CHOL/HDL Ratio: 4
Triglycerides: 137 mg/dL (ref 0.0–149.0)
VLDL: 27.4 mg/dL (ref 0.0–40.0)

## 2016-12-19 LAB — HEMOGLOBIN A1C: Hgb A1c MFr Bld: 6.1 % (ref 4.6–6.5)

## 2016-12-19 MED ORDER — DIAZEPAM 5 MG PO TABS: 5.0000 mg | ORAL_TABLET | Freq: Every day | ORAL | 1 refills | Status: DC

## 2016-12-19 NOTE — Progress Notes (Signed)
Brett [name] is a 41 y.o. male is here for follow up.  History of Present Illness:   Shaune Pascal CMA acting as scribe for Dr. Juleen China.  HPI: Patient comes in today for a three month follow up.   Nerve pain in feet: Patient stated that he is having bilateral foot pain. He states that he is having numbness and sharp pains. He stated that the medication does help when he takes it. Sometimes if he doesn't take the medication the pain is severe.   Insomnia: Patient is having trouble falling asleep. Discussed with patient about poor sleep hygiene. Patient has to start taking responsibility with his sleep habits. Talked about changing from Ambien to Valium to help with sleep.   Weight Gain: Patient stated that he does not sleep well and that causes him to eat more. He said that he will sit there and eat a bag of chips. He eats mainly between 11 PM and 2 AM. He takes the Ambien about 9 or 10 PM. He does not know how to change his habits. Provider is concerned that the Ambien may be causing more harm by making him eat more. Patient stated that he does nap during the day.   Health Maintenance Due  Topic Date Due  . OPHTHALMOLOGY EXAM  12/30/1984  . TETANUS/TDAP  12/30/1993   Depression screen PHQ 2/9 07/29/2016 06/15/2016  Decreased Interest 0 0  Down, Depressed, Hopeless 0 0  PHQ - 2 Score 0 0   PMHx, SurgHx, SocialHx, FamHx, Medications, and Allergies were reviewed in the Visit Navigator and updated as appropriate.   Patient Active Problem List   Diagnosis Date Noted  . Shoulder impingement 06/16/2016  . Elevated LFTs 06/16/2016  . Mixed hyperlipidemia 06/16/2016  . Type 2 diabetes mellitus with diabetic polyneuropathy, without long-term current use of insulin ([place]) 06/16/2016  . Bipolar 2 disorder ([place]) 06/15/2016  . Left rotator cuff tear arthropathy 06/15/2016  . Essential hypertension 06/15/2016  . Gastroesophageal reflux disease without esophagitis 06/15/2016  . Chronic  seasonal allergic rhinitis due to pollen 06/15/2016  . Peripheral polyneuropathy 06/15/2016  . Morbid obesity ([place]) 06/15/2016  . Insomnia 06/15/2016  . Chronic pain of both knees 06/15/2016  . Asthma    Social History  Substance Use Topics  . Smoking status: Never Smoker  . Smokeless tobacco: Never Used  . Alcohol use No   Current Medications and Allergies:   Current Outpatient Prescriptions:  .  albuterol (PROVENTIL HFA;VENTOLIN HFA) 108 (90 Base) MCG/ACT inhaler, Inhale 2 puffs into the lungs every 6 (six) hours as needed for wheezing or shortness of breath., Disp: 3 Inhaler, Rfl: 0 .  amLODipine (NORVASC) 5 MG tablet, Take 1 tablet (5 mg total) by mouth daily., Disp: 90 tablet, Rfl: 3 .  B-D UF III MINI PEN NEEDLES 31G X 5 MM MISC, USE TO INJECT VICTOZA DAILY, Disp: 100 each, Rfl: 1 .  celecoxib (CELEBREX) 100 MG capsule, TAKE 1 CAPSULE (100 MG TOTAL) BY MOUTH 2 (TWO) TIMES DAILY AS NEEDED., Disp: 60 capsule, Rfl: 0 .  cyanocobalamin 1000 MCG tablet, Take 1,000 mcg by mouth daily., Disp: , Rfl:  .  escitalopram (LEXAPRO) 20 MG tablet, Take 1 tablet (20 mg total) by mouth at bedtime., Disp: 90 tablet, Rfl: 3 .  Fexofenadine HCl (ALLEGRA ALLERGY PO), Take by mouth., Disp: , Rfl:  .  glucose blood test strip, Accu-Chek Guide - Check blood sugar 3 times per day., Disp: 100 each, Rfl: 12 .  Lancets (ACCU-CHEK MULTICLIX) lancets, Accu-Chek Guide - Check blood sugar 3 times per day., Disp: 100 each, Rfl: 12 .  liraglutide (VICTOZA) 18 MG/3ML SOPN, Inject 0.2 mLs (1.2 mg total) into the skin daily., Disp: 6 pen, Rfl: 3 .  lisinopril (PRINIVIL,ZESTRIL) 5 MG tablet, Take 1 tablet (5 mg total) by mouth daily., Disp: 90 tablet, Rfl: 3 .  LORazepam (ATIVAN) 1 MG tablet, TAKE 1 TO 2 TABLETS BY MOUTH AT BEDTIME AS NEEDED FOR INSOMNIA, Disp: 40 tablet, Rfl: 1 .  MAGNESIUM CITRATE PO, Take 1 tablet by mouth every evening., Disp: , Rfl:  .  metFORMIN (GLUCOPHAGE-XR) 500 MG 24 hr tablet, TAKE 1 TABLET  (500 MG TOTAL) BY MOUTH 2 (TWO) TIMES DAILY., Disp: 60 tablet, Rfl: 3 .  omeprazole (PRILOSEC) 40 MG capsule, TAKE 1 CAPSULE BY MOUTH DAILY., Disp: 30 capsule, Rfl: 11 .  pregabalin (LYRICA) 100 MG capsule, Take 1 capsule (100 mg total) by mouth 3 (three) times daily., Disp: 90 capsule, Rfl: 0 .  rosuvastatin (CRESTOR) 10 MG tablet, TAKE 1 TABLET (10 MG TOTAL) BY MOUTH DAILY., Disp: 30 tablet, Rfl: 3 .  silver sulfADIAZINE (SILVADENE) 1 % cream, Apply 1 application topically daily., Disp: 50 g, Rfl: 0 .  zolpidem (AMBIEN) 10 MG tablet, TAKE 1 TABLET BY MOUTH AT BEDTIME, Disp: 30 tablet, Rfl: 1  No Known Allergies   Review of Systems   Pertinent items are noted in the HPI. Otherwise, ROS is negative.  Vitals:   Vitals:   12/19/16 0727  BP: 118/82  Pulse: 81  Temp: 98.1 F (36.7 C)  TempSrc: Oral  SpO2: 95%  Weight: (!) 313 lb (142 kg)  Height: 6' (1.829 m)     Body mass index is 42.45 kg/m.   Physical Exam:   Physical Exam  Constitutional: He is oriented to person, place, and time. He appears well-developed and well-nourished. No distress.  HENT:  Head: Normocephalic and atraumatic.  Right Ear: External ear normal.  Left Ear: External ear normal.  Nose: Nose normal.  Mouth/Throat: Oropharynx is clear and moist.  Eyes: Pupils are equal, round, and reactive to light. Conjunctivae and EOM are normal.  Neck: Normal range of motion. Neck supple.  Cardiovascular: Normal rate, regular rhythm, normal heart sounds and intact distal pulses.   Pulmonary/Chest: Effort normal and breath sounds normal.  Abdominal: Soft. Bowel sounds are normal.  Musculoskeletal: Normal range of motion.  Neurological: He is alert and oriented to person, place, and time.  Skin: Skin is warm and dry.  Psychiatric: He has a normal mood and affect. His behavior is normal. Judgment and thought content normal.  Nursing note and vitals reviewed.  Results for orders placed or performed in visit on 12/19/16    Comprehensive metabolic panel  Result Value Ref Range   Sodium 142 135 - 145 mEq/L   Potassium 5.0 3.5 - 5.1 mEq/L   Chloride 101 96 - 112 mEq/L   CO2 32 19 - 32 mEq/L   Glucose, Bld 93 70 - 99 mg/dL   BUN 15 6 - 23 mg/dL   Creatinine, Ser 0.80 0.40 - 1.50 mg/dL   Total Bilirubin 0.5 0.2 - 1.2 mg/dL   Alkaline Phosphatase 48 39 - 117 U/L   AST 29 0 - 37 U/L   ALT 28 0 - 53 U/L   Total Protein 7.1 6.0 - 8.3 g/dL   Albumin 4.6 3.5 - 5.2 g/dL   Calcium 9.7 8.4 - 10.5 mg/dL   GFR 112.69 >  60.00 mL/min  Lipid panel  Result Value Ref Range   Cholesterol 150 0 - 200 mg/dL   Triglycerides 137.0 0.0 - 149.0 mg/dL   HDL 38.50 (L) >39.00 mg/dL   VLDL 27.4 0.0 - 40.0 mg/dL   LDL Cholesterol 84 0 - 99 mg/dL   Total CHOL/HDL Ratio 4    NonHDL 111.36   Hemoglobin A1c  Result Value Ref Range   Hgb A1c MFr Bld 6.1 4.6 - 6.5 %   Assessment and Plan:   Brett [name] was seen today for follow-up.  Diagnoses and all orders for this visit:  Peripheral polyneuropathy Comments: Still with pain despite max dose Lyrica. Nerve conduction study ordered.   Type 2 diabetes mellitus with diabetic polyneuropathy, without long-term current use of insulin ([facility]) Comments: A1c continues to be at goal. Orders: -     Hemoglobin A1c  Essential hypertension Comments: Doing well. Continue current treatment.   Elevated LFTs Comments: Improved. Orders: -     Comprehensive metabolic panel  Mixed hyperlipidemia Comments: Encouraged exercise to help raise HDL. Orders: -     Lipid panel  Need for immunization against influenza -     Flu Vaccine QUAD 36+ mos IM  Primary insomnia Comments: Will DC Ambien and Ativan. Trial Valium. Discussed sleep hygeine.  Orders: -     diazepam (VALIUM) 5 MG tablet; Take 1 tablet (5 mg total) by mouth at bedtime.   . Reviewed expectations re: course of current medical issues. . Discussed self-management of symptoms. . Outlined signs and symptoms indicating need for  more acute intervention. . Patient verbalized understanding and all questions were answered. Marland Kitchen Health Maintenance issues including appropriate healthy diet, exercise, and smoking avoidance were discussed with patient. . See orders for this visit as documented in the electronic medical record. . Patient received an After Visit Summary.  CMA served as Education administrator during this visit. History, Physical, and Plan performed by medical provider. The above documentation has been reviewed and is accurate and complete. Briscoe Deutscher, D.O.  Briscoe Deutscher, DO [place] 12/20/2016  Future Appointments Date Time Provider [place]  12/29/2016 8:00 AM Narda Amber K, DO [facility] None  03/29/2017 7:30 AM Briscoe Deutscher, DO [facility] None

## 2016-12-19 NOTE — Patient Instructions (Signed)
15 Tips to a Better Night's Sleep   Practice "good sleep hygiene". Here are some tips for you to try:   1. No reading or watching TV in bed. These are waking activities.  2. Go to bed when you're sleepy-tired, not when it's time to go to bed by habit.  3. Wind down during the second half of the evening before bedtime. Don't get involved in any kind of anxiety-provoking activities of thought 90 minutes before retiring to bed.  4. Do some breathing exercises or try to relax major muscle groups. Start at the toes and work up the body all the way to the forehead.  5. Your bed is for sleeping, so if you cannot sleep after 15-20 minutes in bed, get up and do something relaxing.  6. Have your room cool instead of warm.  7. Don't count sheep--counting is stimulating.  8. Exercise in the afternoon or early evening, but no later than three hours before bedtime.  9. Don't overeat or eat two to three hours before bedtime.  10. Try not to nap during the day.  11. If you awake in the middle of the night and can't get back to sleep within 30 minutes, get up and do something relaxing (no TV or reading anything stimulating).  12. Have no caffeine, alcohol or cigarettes two to three hours before retiring to bed.  13. If you have disturbing dreams or nightmares repeatedly, try to add an ending you enjoy or like better.  14. Keep a sleep journal. Thirty minutes before you go to bed, write down your concerns and hopes. It frees up your sleep from processing your dilemmas.  15. Listen to calming music or recorded sounds (ocean, forest, birds, crickets, brook) before bed.   If sleep problems persist, contact your physician or mental health professional. Let them know what is happening in your life. Your problem may have either organic or psychological contributors. Sleep disorders are considered chronic if they persist over more than one month.   

## 2016-12-20 ENCOUNTER — Encounter: Payer: Self-pay | Admitting: Family Medicine

## 2016-12-20 ENCOUNTER — Encounter: Payer: Self-pay | Admitting: Neurology

## 2016-12-21 ENCOUNTER — Encounter: Payer: Self-pay | Admitting: Family Medicine

## 2016-12-25 ENCOUNTER — Other Ambulatory Visit: Payer: Self-pay | Admitting: Family Medicine

## 2016-12-25 DIAGNOSIS — G629 Polyneuropathy, unspecified: Secondary | ICD-10-CM

## 2016-12-26 ENCOUNTER — Other Ambulatory Visit: Payer: Self-pay

## 2016-12-26 DIAGNOSIS — G629 Polyneuropathy, unspecified: Secondary | ICD-10-CM

## 2016-12-26 MED ORDER — PREGABALIN 100 MG PO CAPS: 100.0000 mg | ORAL_CAPSULE | Freq: Three times a day (TID) | ORAL | 2 refills | Status: DC

## 2016-12-26 NOTE — Telephone Encounter (Signed)
Refill was called in to patient's pharmacy.

## 2016-12-26 NOTE — Telephone Encounter (Signed)
Okay 

## 2016-12-26 NOTE — Telephone Encounter (Signed)
Please advise on refill.

## 2016-12-29 ENCOUNTER — Ambulatory Visit (INDEPENDENT_AMBULATORY_CARE_PROVIDER_SITE_OTHER): Payer: BC Managed Care – PPO | Admitting: Neurology

## 2016-12-29 DIAGNOSIS — G629 Polyneuropathy, unspecified: Secondary | ICD-10-CM

## 2016-12-29 NOTE — Procedures (Signed)
[facility]  [address]  [place], [place] [address] Tel: [phone] Fax:  [phone] Test Date:  12/29/2016  Patient: Brett [name] DOB: 1975-06-18 Physician: Narda Amber, DO  Sex: Male Height: 6\' 0"  Ref Phys: Briscoe Deutscher, DO  ID#: [id number] Temp: 34.3C Technician:    Patient Complaints: This is a 41 year old man referred for evaluation of bilateral foot pain and numbness.  NCV & EMG Findings: Extensive electrodiagnostic testing of the right lower extremity and additional studies of the left shows:  1. Bilateral sural and superficial peroneal sensory responses are within normal limits. 2. Bilateral peroneal and tibial motor responses are within normal limits. 3. Right tibial H reflex is within normal limits. 4. There is no evidence of active or chronic motor axon loss changes affecting any of the tested muscles. Motor unit configuration and recruitment pattern is within normal limits.  Impression: This is a normal study.   In particular, there is no evidence of a large fiber sensorimotor polyneuropathy or lumbosacral radiculopathy.   ___________________________ Narda Amber, DO    Nerve Conduction Studies Anti Sensory Summary Table   Site NR Peak (ms) Norm Peak (ms) P-T Amp (V) Norm P-T Amp  Left Sup Peroneal Anti Sensory (Ant Lat Mall)  12 cm    3.5 <4.5 8.8 >5  Right Sup Peroneal Anti Sensory (Ant Lat Mall)  12 cm    2.9 <4.5 11.2 >5  Left Sural Anti Sensory (Lat Mall)  Calf    3.5 <4.5 10.7 >5  Right Sural Anti Sensory (Lat Mall)  Calf    3.8 <4.5 11.5 >5   Motor Summary Table   Site NR Onset (ms) Norm Onset (ms) O-P Amp (mV) Norm O-P Amp Site1 Site2 Delta-0 (ms) Dist (cm) Vel (m/s) Norm Vel (m/s)  Left Peroneal Motor (Ext Dig Brev)  Ankle    4.1 <5.5 3.6 >3 B Fib Ankle 8.6 37.0 43 >40  B Fib    12.7  3.1  Poplt B Fib 1.4 8.0 57 >40  Poplt    14.1  3.0         Right Peroneal Motor (Ext Dig Brev)  Ankle    2.7 <5.5 3.1 >3 B Fib  Ankle 9.3 39.0 42 >40  B Fib    12.0  2.4  Poplt B Fib 1.4 8.0 57 >40  Poplt    13.4  2.2         Left Tibial Motor (Abd Hall Brev)  Ankle    4.5 <6.0 9.7 >8 Knee Ankle 9.4 41.0 44 >40  Knee    13.9  7.4         Right Tibial Motor (Abd Hall Brev)  Ankle    3.8 <6.0 11.0 >8 Knee Ankle 9.3 41.0 44 >40  Knee    13.1  8.2          H Reflex Studies   NR H-Lat (ms) Lat Norm (ms) L-R H-Lat (ms)  Right Tibial (Gastroc)     34.97 <35    EMG   Side Muscle Ins Act Fibs Psw Fasc Number Recrt Dur Dur. Amp Amp. Poly Poly. Comment  Right AntTibialis Nml Nml Nml Nml Nml Nml Nml Nml Nml Nml Nml Nml N/A  Right Gastroc Nml Nml Nml Nml Nml Nml Nml Nml Nml Nml Nml Nml N/A  Right Flex Dig Long Nml Nml Nml Nml Nml Nml Nml Nml Nml Nml Nml Nml N/A  Right RectFemoris Nml Nml Nml Nml Nml Nml  Nml Nml Nml Nml Nml Nml N/A  Right BicepsFemS Nml Nml Nml Nml Nml Nml Nml Nml Nml Nml Nml Nml N/A  Right GluteusMed Nml Nml Nml Nml Nml Nml Nml Nml Nml Nml Nml Nml N/A  Left AntTibialis Nml Nml Nml Nml Nml Nml Nml Nml Nml Nml Nml Nml N/A  Left Gastroc Nml Nml Nml Nml Nml Nml Nml Nml Nml Nml Nml Nml N/A      Waveforms:

## 2017-01-01 MED ORDER — PREGABALIN 200 MG PO CAPS: 200.0000 mg | ORAL_CAPSULE | Freq: Two times a day (BID) | ORAL | 0 refills | Status: DC

## 2017-01-01 NOTE — Addendum Note (Signed)
Addended by: Briscoe Deutscher R on: 01/01/2017 08:21 PM   Modules accepted: Orders

## 2017-01-11 ENCOUNTER — Other Ambulatory Visit: Payer: Self-pay | Admitting: Sports Medicine

## 2017-02-01 ENCOUNTER — Encounter: Payer: Self-pay | Admitting: Family Medicine

## 2017-02-01 ENCOUNTER — Telehealth: Payer: Self-pay

## 2017-02-01 ENCOUNTER — Ambulatory Visit: Payer: BC Managed Care – PPO | Admitting: Family Medicine

## 2017-02-01 VITALS — BP 148/86 | HR 86 | Temp 99.4°F | Ht 72.0 in | Wt 319.2 lb

## 2017-02-01 DIAGNOSIS — F5102 Adjustment insomnia: Secondary | ICD-10-CM | POA: Diagnosis not present

## 2017-02-01 DIAGNOSIS — R6882 Decreased libido: Secondary | ICD-10-CM | POA: Diagnosis not present

## 2017-02-01 DIAGNOSIS — R632 Polyphagia: Secondary | ICD-10-CM | POA: Diagnosis not present

## 2017-02-01 DIAGNOSIS — Z79899 Other long term (current) drug therapy: Secondary | ICD-10-CM

## 2017-02-01 MED ORDER — DIAZEPAM 5 MG PO TABS: 5.0000 mg | ORAL_TABLET | Freq: Every evening | ORAL | 1 refills | Status: DC | PRN

## 2017-02-01 MED ORDER — LISDEXAMFETAMINE DIMESYLATE 20 MG PO CAPS: 20.0000 mg | ORAL_CAPSULE | Freq: Every day | ORAL | 0 refills | Status: DC

## 2017-02-01 NOTE — Progress Notes (Signed)
Brett [name] is a 41 y.o. male is here for follow up.  History of Present Illness:   HPI:   1. Low libido. Associated with difficulty with achieving orgasm. Monogamous with wife only.     3. Binge eating. Not improving with current treatment of GLP-1.    4. Adjustment insomnia. Uses Valium prn.     5. Morbid obesity ([place]).   Wt Readings from Last 3 Encounters:  02/01/17 (!) 319 lb 3.2 oz (144.8 kg)  12/19/16 (!) 313 lb (142 kg)  09/16/16 (!) 301 lb 3.2 oz (136.6 kg)     Health Maintenance Due  Topic Date Due  . OPHTHALMOLOGY EXAM  12/30/1984  . TETANUS/TDAP  12/30/1993   Depression screen PHQ 2/9 07/29/2016 06/15/2016  Decreased Interest 0 0  Down, Depressed, Hopeless 0 0  PHQ - 2 Score 0 0   PMHx, SurgHx, SocialHx, FamHx, Medications, and Allergies were reviewed in the Visit Navigator and updated as appropriate.   Patient Active Problem List   Diagnosis Date Noted  . Shoulder impingement 06/16/2016  . Elevated LFTs 06/16/2016  . Mixed hyperlipidemia 06/16/2016  . Type 2 diabetes mellitus with diabetic polyneuropathy, without long-term current use of insulin ([place]) 06/16/2016  . Bipolar 2 disorder ([place]) 06/15/2016  . Left rotator cuff tear arthropathy 06/15/2016  . Essential hypertension 06/15/2016  . Gastroesophageal reflux disease without esophagitis 06/15/2016  . Chronic seasonal allergic rhinitis due to pollen 06/15/2016  . Peripheral polyneuropathy 06/15/2016  . Morbid obesity ([place]) 06/15/2016  . Insomnia 06/15/2016  . Chronic pain of both knees 06/15/2016  . Asthma    Social History   Tobacco Use  . Smoking status: Never Smoker  . Smokeless tobacco: Never Used  Substance Use Topics  . Alcohol use: No  . Drug use: No   Current Medications and Allergies:   .  albuterol (PROVENTIL HFA;VENTOLIN HFA) 108 (90 Base) MCG/ACT inhaler, Inhale 2 puffs into the lungs every 6 (six) hours as needed for wheezing or shortness of breath., Disp: 3 Inhaler, Rfl:  0 .  amLODipine (NORVASC) 5 MG tablet, Take 1 tablet (5 mg total) by mouth daily., Disp: 90 tablet, Rfl: 3 .  celecoxib (CELEBREX) 100 MG capsule, TAKE 1 CAPSULE (100 MG TOTAL) BY MOUTH 2 (TWO) TIMES DAILY AS NEEDED. - office visit before next refill, Disp: 60 capsule, Rfl: 0 .  Fexofenadine HCl (ALLEGRA ALLERGY PO), Take by mouth., Disp: , Rfl:  .  liraglutide (VICTOZA) 18 MG/3ML SOPN, Inject 0.2 mLs (1.2 mg total) into the skin daily., Disp: 6 pen, Rfl: 3 .  lisinopril (PRINIVIL,ZESTRIL) 5 MG tablet, Take 1 tablet (5 mg total) by mouth daily., Disp: 90 tablet, Rfl: 3 .  metFORMIN (GLUCOPHAGE-XR) 500 MG 24 hr tablet, TAKE 1 TABLET (500 MG TOTAL) BY MOUTH 2 (TWO) TIMES DAILY., Disp: 60 tablet, Rfl: 3 .  omeprazole (PRILOSEC) 40 MG capsule, TAKE 1 CAPSULE BY MOUTH DAILY., Disp: 30 capsule, Rfl: 11 .  pregabalin (LYRICA) 200 MG capsule, Take 1 capsule (200 mg total) by mouth 2 (two) times daily., Disp: 60 capsule, Rfl: 0 .  rosuvastatin (CRESTOR) 10 MG tablet, TAKE 1 TABLET (10 MG TOTAL) BY MOUTH DAILY., Disp: 30 tablet, Rfl: 3 .  diazepam (VALIUM) 5 MG tablet, Take 1 tablet (5 mg total) at bedtime as needed by mouth for anxiety., Disp: 30 tablet, Rfl: 1  No Known Allergies   Review of Systems   Pertinent items are noted in the HPI. Otherwise, ROS is negative.  Vitals:   Vitals:   02/01/17 0812  BP: (!) 148/86  Pulse: 86  Temp: 99.4 F (37.4 C)  TempSrc: Oral  SpO2: 95%  Weight: (!) 319 lb 3.2 oz (144.8 kg)  Height: 6' (1.829 m)     Body mass index is 43.29 kg/m.   Physical Exam:   Physical Exam  Constitutional: He is oriented to person, place, and time. He appears well-developed and well-nourished. No distress.  HENT:  Head: Normocephalic and atraumatic.  Right Ear: External ear normal.  Left Ear: External ear normal.  Nose: Nose normal.  Mouth/Throat: Oropharynx is clear and moist.  Eyes: Conjunctivae and EOM are normal. Pupils are equal, round, and reactive to light.    Neck: Normal range of motion. Neck supple.  Cardiovascular: Normal rate, regular rhythm, normal heart sounds and intact distal pulses.  Pulmonary/Chest: Effort normal and breath sounds normal.  Abdominal: Soft. Bowel sounds are normal.  Musculoskeletal: Normal range of motion.  Neurological: He is alert and oriented to person, place, and time.  Skin: Skin is warm and dry.  Psychiatric: He has a normal mood and affect. His behavior is normal. Judgment and thought content normal.  Nursing note and vitals reviewed.   Results for orders placed or performed in visit on 12/19/16  Comprehensive metabolic panel  Result Value Ref Range   Sodium 142 135 - 145 mEq/L   Potassium 5.0 3.5 - 5.1 mEq/L   Chloride 101 96 - 112 mEq/L   CO2 32 19 - 32 mEq/L   Glucose, Bld 93 70 - 99 mg/dL   BUN 15 6 - 23 mg/dL   Creatinine, Ser 0.80 0.40 - 1.50 mg/dL   Total Bilirubin 0.5 0.2 - 1.2 mg/dL   Alkaline Phosphatase 48 39 - 117 U/L   AST 29 0 - 37 U/L   ALT 28 0 - 53 U/L   Total Protein 7.1 6.0 - 8.3 g/dL   Albumin 4.6 3.5 - 5.2 g/dL   Calcium 9.7 8.4 - 10.5 mg/dL   GFR 112.69 >60.00 mL/min  Lipid panel  Result Value Ref Range   Cholesterol 150 0 - 200 mg/dL   Triglycerides 137.0 0.0 - 149.0 mg/dL   HDL 38.50 (L) >39.00 mg/dL   VLDL 27.4 0.0 - 40.0 mg/dL   LDL Cholesterol 84 0 - 99 mg/dL   Total CHOL/HDL Ratio 4    NonHDL 111.36   Hemoglobin A1c  Result Value Ref Range   Hgb A1c MFr Bld 6.1 4.6 - 6.5 %   EKG: normal EKG, normal sinus rhythm.   Assessment and Plan:   Diagnoses and all orders for this visit:  Low libido Comments: Patient actually having more issues with climax. Discussed options. Will wean Lexapro. Trial Vyvanse as below. Will test testosterone.  Orders: -     Cancel: Testosterone, Free, Total, SHBG -     Testos,Total,Free and SHBG (Male)  High risk medication use Comments: EKG NSR without ST or T changes.  Orders: -     EKG 12-Lead  Binge  eating Comments: Vyvanse. Orders: -     lisdexamfetamine (VYVANSE) 20 MG capsule; Take 1 capsule (20 mg total) daily by mouth.  Adjustment insomnia Comments: Sleep hygeine reviewed. Will treat daily sleepiness and anxiety as above. Okay prn Valium during adjustment.  Orders: -     diazepam (VALIUM) 5 MG tablet; Take 1 tablet (5 mg total) at bedtime as needed by mouth for anxiety.  Morbid obesity ([place]) Comments: Healthy eating and excercise  encouraged. Orders: -     lisdexamfetamine (VYVANSE) 20 MG capsule; Take 1 capsule (20 mg total) daily by mouth.   . Reviewed expectations re: course of current medical issues. . Discussed self-management of symptoms. . Outlined signs and symptoms indicating need for more acute intervention. . Patient verbalized understanding and all questions were answered. Marland Kitchen Health Maintenance issues including appropriate healthy diet, exercise, and smoking avoidance were discussed with patient. . See orders for this visit as documented in the electronic medical record. . Patient received an After Visit Summary.  Briscoe Deutscher, DO [place] 02/01/2017  Future Appointments  Date Time Provider [place]  03/29/2017  7:40 AM Briscoe Deutscher, DO [facility] None

## 2017-02-01 NOTE — Telephone Encounter (Signed)
PA for Vyvanse initiated through Cover My Meds.  Awaiting insurance decision.

## 2017-02-01 NOTE — Telephone Encounter (Signed)
PA for Vyvanse approved.  Approval notice faxed to patient's pharmacy.

## 2017-02-01 NOTE — Patient Instructions (Signed)
[place].  TRY THE VYVANSE - I AM GIVING YOU A COUPON. WE MAY NEED TO CALL FOR PRIOR AUTHORIZATION.

## 2017-02-03 ENCOUNTER — Encounter: Payer: Self-pay | Admitting: Family Medicine

## 2017-02-03 MED ORDER — BUPROPION HCL ER (XL) 150 MG PO TB24: 150.0000 mg | ORAL_TABLET | Freq: Every day | ORAL | 3 refills | Status: DC

## 2017-02-03 MED ORDER — AMOXICILLIN 875 MG PO TABS: 875.0000 mg | ORAL_TABLET | Freq: Two times a day (BID) | ORAL | 0 refills | Status: DC

## 2017-02-03 MED ORDER — DIAZEPAM 10 MG PO TABS: 10.0000 mg | ORAL_TABLET | Freq: Every evening | ORAL | 1 refills | Status: DC | PRN

## 2017-02-03 NOTE — Telephone Encounter (Signed)
Spoke to pt, told him Dr. Juleen China sent in Rx for Wellbutrin and I faxed over Rx for Valium. Pt verbalized understanding and said is it written different? Told pt I do not know you was ordered one tablet at [facility] as needed. Pt said he was taking two tablets and that the pharmacy would not refill cause it was too early. Told pt to try one tablet if they will fill if not can try [facility] and pay out of pocket until script can be filled. Pt verbalized understanding. Told pt if he has any problems please call Monday to discuss with Dr. Juleen China. Pt verbalized understanding.

## 2017-02-07 LAB — TESTOS,TOTAL,FREE AND SHBG (FEMALE)
Free Testosterone: 35.2 pg/mL (ref 35.0–155.0)
Sex Hormone Binding: 27 nmol/L (ref 10–50)
Testosterone, Total, LC-MS-MS: 198 ng/dL — ABNORMAL LOW (ref 250–1100)

## 2017-02-08 ENCOUNTER — Encounter: Payer: Self-pay | Admitting: Surgical

## 2017-02-08 ENCOUNTER — Encounter: Payer: Self-pay | Admitting: Family Medicine

## 2017-02-09 ENCOUNTER — Other Ambulatory Visit: Payer: Self-pay | Admitting: Family Medicine

## 2017-02-09 DIAGNOSIS — E1142 Type 2 diabetes mellitus with diabetic polyneuropathy: Secondary | ICD-10-CM

## 2017-02-09 DIAGNOSIS — E782 Mixed hyperlipidemia: Secondary | ICD-10-CM

## 2017-02-10 ENCOUNTER — Other Ambulatory Visit: Payer: Self-pay | Admitting: Sports Medicine

## 2017-02-10 ENCOUNTER — Other Ambulatory Visit: Payer: Self-pay | Admitting: Physical Therapy

## 2017-02-10 MED ORDER — CELECOXIB 100 MG PO CAPS: ORAL_CAPSULE | ORAL | 0 refills | Status: DC

## 2017-02-15 ENCOUNTER — Other Ambulatory Visit: Payer: Self-pay | Admitting: Family Medicine

## 2017-02-15 MED ORDER — AMOXICILLIN 875 MG PO TABS: 875.0000 mg | ORAL_TABLET | Freq: Two times a day (BID) | ORAL | 0 refills | Status: DC

## 2017-02-20 ENCOUNTER — Other Ambulatory Visit: Payer: Self-pay | Admitting: Family Medicine

## 2017-02-27 ENCOUNTER — Encounter: Payer: Self-pay | Admitting: Family Medicine

## 2017-03-03 ENCOUNTER — Ambulatory Visit: Payer: BC Managed Care – PPO | Admitting: Family Medicine

## 2017-03-03 ENCOUNTER — Encounter: Payer: Self-pay | Admitting: Family Medicine

## 2017-03-03 VITALS — BP 128/84 | HR 83 | Temp 98.2°F | Ht 72.0 in | Wt 306.0 lb

## 2017-03-03 DIAGNOSIS — R7989 Other specified abnormal findings of blood chemistry: Secondary | ICD-10-CM

## 2017-03-03 DIAGNOSIS — R632 Polyphagia: Secondary | ICD-10-CM | POA: Diagnosis not present

## 2017-03-03 MED ORDER — LISDEXAMFETAMINE DIMESYLATE 20 MG PO CAPS
20.0000 mg | ORAL_CAPSULE | Freq: Every day | ORAL | 0 refills | Status: DC
Start: 2017-03-03 — End: 2017-03-03

## 2017-03-03 MED ORDER — LISDEXAMFETAMINE DIMESYLATE 20 MG PO CAPS: 20.0000 mg | ORAL_CAPSULE | Freq: Every day | ORAL | 0 refills | Status: DC

## 2017-03-03 NOTE — Progress Notes (Signed)
Brett [name] is a 41 y.o. male is here for follow up.  History of Present Illness:   HPI: See Assessment and Plan section for Problem Based Charting of issues discussed today.  Within the past 3 months... Rarely Sometimes Often Always  During the past 3 months, have you had any episodes of excessive overeating? x     Do you feel distressed about your excessive overeating?   x   During your episodes of excessive overeating, how often did you feel like you had no control over your eating?    x  During your episodes of excessive eating, how often did you continue to eat even though you were not hungry?    x  During your episodes of excessive overeating, how often were you embarrassed by how much you ate?    x  During your episodes of excessive overeating, how often did you feel disgusted by yourself or guilty afterwards?    x  During the last 3 months, how often did you make yourself vomit as a means to control your weight or shape? NEVER      Health Maintenance Due  Topic Date Due  . OPHTHALMOLOGY EXAM  12/30/1984  . TETANUS/TDAP  12/30/1993   Depression screen PHQ 2/9 07/29/2016 06/15/2016  Decreased Interest 0 0  Down, Depressed, Hopeless 0 0  PHQ - 2 Score 0 0   PMHx, SurgHx, SocialHx, FamHx, Medications, and Allergies were reviewed in the Visit Navigator and updated as appropriate.   Patient Active Problem List   Diagnosis Date Noted  . Shoulder impingement 06/16/2016  . Elevated LFTs 06/16/2016  . Mixed hyperlipidemia 06/16/2016  . Type 2 diabetes mellitus with diabetic polyneuropathy, without long-term current use of insulin ([place]) 06/16/2016  . Bipolar 2 disorder ([place]) 06/15/2016  . Left rotator cuff tear arthropathy 06/15/2016  . Essential hypertension 06/15/2016  . Gastroesophageal reflux disease without esophagitis 06/15/2016  . Chronic seasonal allergic rhinitis due to pollen 06/15/2016  . Peripheral polyneuropathy 06/15/2016  . Morbid obesity ([place]) 06/15/2016    . Insomnia 06/15/2016  . Chronic pain of both knees 06/15/2016  . Asthma    Social History   Tobacco Use  . Smoking status: Never Smoker  . Smokeless tobacco: Never Used  Substance Use Topics  . Alcohol use: No  . Drug use: No   Current Medications and Allergies:   .  albuterol (PROVENTIL HFA;VENTOLIN HFA) 108 (90 Base) MCG/ACT inhaler, Inhale 2 puffs into the lungs every 6 (six) hours as needed for wheezing or shortness of breath., Disp: 3 Inhaler, Rfl: 0 .  amLODipine (NORVASC) 5 MG tablet, Take 1 tablet (5 mg total) by mouth daily., Disp: 90 tablet, Rfl: 3 .  amoxicillin (AMOXIL) 875 MG tablet, Take 1 tablet (875 mg total) by mouth 2 (two) times daily., Disp: 20 tablet, Rfl: 0 .  B-D UF III MINI PEN NEEDLES 31G X 5 MM MISC, USE TO INJECT VICTOZA DAILY, Disp: 100 each, Rfl: 1 .  buPROPion (WELLBUTRIN XL) 150 MG 24 hr tablet, Take 1 tablet (150 mg total) daily by mouth., Disp: 30 tablet, Rfl: 3 .  celecoxib (CELEBREX) 100 MG capsule, TAKE 1 CAPSULE (100 MG TOTAL) BY MOUTH 2 (TWO) TIMES DAILY AS NEEDED. - office visit before next refill, Disp: 60 capsule, Rfl: 0 .  diazepam (VALIUM) 10 MG tablet, Take 1 tablet (10 mg total) at bedtime as needed by mouth for anxiety., Disp: 30 tablet, Rfl: 1 .  Fexofenadine HCl (ALLEGRA ALLERGY  PO), Take by mouth., Disp: , Rfl:  .  glucose blood test strip, Accu-Chek Guide - Check blood sugar 3 times per day., Disp: 100 each, Rfl: 12 .  Lancets (ACCU-CHEK MULTICLIX) lancets, Accu-Chek Guide - Check blood sugar 3 times per day., Disp: 100 each, Rfl: 12 .  liraglutide (VICTOZA) 18 MG/3ML SOPN, Inject 0.2 mLs (1.2 mg total) into the skin daily., Disp: 6 pen, Rfl: 3 .  lisdexamfetamine (VYVANSE) 20 MG capsule, Take 1 capsule (20 mg total) daily by mouth., Disp: 30 capsule, Rfl: 0 .  lisinopril (PRINIVIL,ZESTRIL) 5 MG tablet, Take 1 tablet (5 mg total) by mouth daily., Disp: 90 tablet, Rfl: 3 .  LYRICA 200 MG capsule, TAKE 1 CAPSULE BY MOUTH TWICE DAILY,  Disp: 60 capsule, Rfl: 0 .  MAGNESIUM CITRATE PO, Take 1 tablet by mouth every evening., Disp: , Rfl:  .  metFORMIN (GLUCOPHAGE-XR) 500 MG 24 hr tablet, TAKE 1 TABLET (500 MG TOTAL) BY MOUTH 2 (TWO) TIMES DAILY., Disp: 60 tablet, Rfl: 3 .  omeprazole (PRILOSEC) 40 MG capsule, TAKE 1 CAPSULE BY MOUTH DAILY., Disp: 30 capsule, Rfl: 11 .  rosuvastatin (CRESTOR) 10 MG tablet, TAKE 1 TABLET (10 MG TOTAL) BY MOUTH DAILY., Disp: 30 tablet, Rfl: 3  No Known Allergies   Review of Systems   Pertinent items are noted in the HPI. Otherwise, ROS is negative.  Vitals:  There were no vitals filed for this visit.   There is no height or weight on file to calculate BMI. Physical Exam:   Physical Exam  Constitutional: He is oriented to person, place, and time. He appears well-developed and well-nourished. No distress.  HENT:  Head: Normocephalic and atraumatic.  Right Ear: External ear normal.  Left Ear: External ear normal.  Nose: Nose normal.  Mouth/Throat: Oropharynx is clear and moist.  Eyes: Conjunctivae and EOM are normal. Pupils are equal, round, and reactive to light.  Neck: Normal range of motion. Neck supple.  Cardiovascular: Normal rate, regular rhythm, normal heart sounds and intact distal pulses.  Pulmonary/Chest: Effort normal and breath sounds normal.  Abdominal: Soft. Bowel sounds are normal.  Musculoskeletal: Normal range of motion.  Neurological: He is alert and oriented to person, place, and time.  Skin: Skin is warm and dry.  Psychiatric: He has a normal mood and affect. His behavior is normal. Judgment and thought content normal.  Nursing note and vitals reviewed.   Results for orders placed or performed in visit on 02/01/17  Testos,Total,Free and SHBG (Male)  Result Value Ref Range   Testosterone, Total, LC-MS-MS 198 (L) 250 - 1,100 ng/dL   Free Testosterone 35.2 35.0 - 155.0 pg/mL   Sex Hormone Binding 27 10 - 50 nmol/L   Assessment and Plan:   Diagnoses and all  orders for this visit:  Binge eating Comments: Vyvanse working well for the patient.  He takes the 20 mg dose daily.  This has helped him to make better food choices.  He has also started exercising in the form of a recumbent bike daily.  He would like to continue with this medication.  69-month description was provided today. Orders: -     lisdexamfetamine (VYVANSE) 20 MG capsule; Take 1 capsule (20 mg total) by mouth daily. (3 PRESCRIPTIONS PROVIDED)  Morbid obesity ([facility]) Comments: As above.  Patient is doing very well with Vyvanse.  We discussed healthy food choices and regular exercise.  Follow-up 3 months. Orders: -     lisdexamfetamine (VYVANSE) 20 MG capsule; Take  1 capsule (20 mg total) by mouth daily.  Low testosterone in male Comments: Recheck today as testosterone level was low at his previous check. Orders: -     Testosterone, Free, Total, SHBG    . Reviewed expectations re: course of current medical issues. . Discussed self-management of symptoms. . Outlined signs and symptoms indicating need for more acute intervention. . Patient verbalized understanding and all questions were answered. Marland Kitchen Health Maintenance issues including appropriate healthy diet, exercise, and smoking avoidance were discussed with patient. . See orders for this visit as documented in the electronic medical record. . Patient received an After Visit Summary.  Briscoe Deutscher, DO [place] 03/03/2017  Future Appointments  Date Time Provider [place]  03/29/2017  7:40 AM Briscoe Deutscher, DO [facility]

## 2017-03-08 LAB — TESTOS,TOTAL,FREE AND SHBG (FEMALE)
Free Testosterone: 34.1 pg/mL — ABNORMAL LOW (ref 35.0–155.0)
Sex Hormone Binding: 25 nmol/L (ref 10–50)
Testosterone, Total, LC-MS-MS: 273 ng/dL (ref 250–1100)

## 2017-03-09 ENCOUNTER — Other Ambulatory Visit: Payer: Self-pay | Admitting: Sports Medicine

## 2017-03-10 ENCOUNTER — Encounter: Payer: Self-pay | Admitting: Family Medicine

## 2017-03-11 ENCOUNTER — Encounter: Payer: Self-pay | Admitting: Family Medicine

## 2017-03-11 MED ORDER — TESTOSTERONE CYPIONATE 200 MG/ML IM SOLN: 200.0000 mg | INTRAMUSCULAR | 3 refills | Status: DC

## 2017-03-11 NOTE — Addendum Note (Signed)
Addended by: Briscoe Deutscher R on: 03/11/2017 08:58 PM   Modules accepted: Orders

## 2017-03-14 ENCOUNTER — Telehealth: Payer: Self-pay

## 2017-03-14 NOTE — Telephone Encounter (Signed)
PA for testosterone has been approved.  Approval faxed to patient's pharmacy.

## 2017-03-24 ENCOUNTER — Telehealth: Payer: Self-pay | Admitting: Family Medicine

## 2017-03-24 ENCOUNTER — Other Ambulatory Visit: Payer: Self-pay | Admitting: Family Medicine

## 2017-03-24 MED ORDER — PREGABALIN 200 MG PO CAPS
200.0000 mg | ORAL_CAPSULE | Freq: Two times a day (BID) | ORAL | 0 refills | Status: DC
Start: 2017-03-24 — End: 2017-03-24

## 2017-03-24 MED ORDER — PREGABALIN 200 MG PO CAPS: 200.0000 mg | ORAL_CAPSULE | Freq: Two times a day (BID) | ORAL | 0 refills | Status: DC

## 2017-03-24 NOTE — Telephone Encounter (Signed)
E-scribed to requested pharmacy.

## 2017-03-24 NOTE — Telephone Encounter (Signed)
Copied from [place] [phone]. Topic: Quick Communication - See Telephone Encounter >> Mar 24, 2017 12:34 PM Hewitt Shorts wrote: CRM for notification. See Telephone encounter for:  Pt is out of town and is needing his lyrica called in or faxed to [facility] [place]  [place] number [phone] 03/24/17.

## 2017-03-24 NOTE — Telephone Encounter (Signed)
I ma not sure if we can send this over state lines. Can you E scribe?

## 2017-03-24 NOTE — Telephone Encounter (Signed)
Pt out of town and asking for refill of Lyrica to be sent to [facility] in [place]. Last refilled on 02/20/17.

## 2017-03-24 NOTE — Telephone Encounter (Signed)
See refill request.

## 2017-03-25 ENCOUNTER — Encounter: Payer: Self-pay | Admitting: Surgical

## 2017-03-29 ENCOUNTER — Ambulatory Visit: Payer: BC Managed Care – PPO | Admitting: Family Medicine

## 2017-03-29 ENCOUNTER — Encounter: Payer: Self-pay | Admitting: Family Medicine

## 2017-03-29 VITALS — BP 126/84 | HR 88 | Temp 98.6°F | Wt 306.6 lb

## 2017-03-29 DIAGNOSIS — R4589 Other symptoms and signs involving emotional state: Secondary | ICD-10-CM

## 2017-03-29 DIAGNOSIS — E1142 Type 2 diabetes mellitus with diabetic polyneuropathy: Secondary | ICD-10-CM | POA: Diagnosis not present

## 2017-03-29 DIAGNOSIS — R632 Polyphagia: Secondary | ICD-10-CM | POA: Diagnosis not present

## 2017-03-29 DIAGNOSIS — E291 Testicular hypofunction: Secondary | ICD-10-CM | POA: Diagnosis not present

## 2017-03-29 DIAGNOSIS — F489 Nonpsychotic mental disorder, unspecified: Secondary | ICD-10-CM

## 2017-03-29 DIAGNOSIS — E782 Mixed hyperlipidemia: Secondary | ICD-10-CM | POA: Diagnosis not present

## 2017-03-29 DIAGNOSIS — I10 Essential (primary) hypertension: Secondary | ICD-10-CM

## 2017-03-29 LAB — POCT GLYCOSYLATED HEMOGLOBIN (HGB A1C): Hemoglobin A1C: 5.4

## 2017-03-29 MED ORDER — PREGABALIN 200 MG PO CAPS: 200.0000 mg | ORAL_CAPSULE | Freq: Two times a day (BID) | ORAL | 0 refills | Status: DC

## 2017-03-29 MED ORDER — LISINOPRIL 5 MG PO TABS: 5.0000 mg | ORAL_TABLET | Freq: Every day | ORAL | 3 refills | Status: DC

## 2017-03-29 MED ORDER — ACCU-CHEK MULTICLIX LANCETS MISC: 12 refills | Status: DC

## 2017-03-29 MED ORDER — LIRAGLUTIDE 18 MG/3ML ~~LOC~~ SOPN: 1.2000 mg | PEN_INJECTOR | Freq: Every day | SUBCUTANEOUS | 3 refills | Status: DC

## 2017-03-29 MED ORDER — AMLODIPINE BESYLATE 5 MG PO TABS: 5.0000 mg | ORAL_TABLET | Freq: Every day | ORAL | 3 refills | Status: DC

## 2017-03-29 MED ORDER — TESTOSTERONE CYPIONATE 200 MG/ML IM SOLN: 200.0000 mg | INTRAMUSCULAR | 3 refills | Status: DC

## 2017-03-29 MED ORDER — CELECOXIB 100 MG PO CAPS: ORAL_CAPSULE | ORAL | 1 refills | Status: DC

## 2017-03-29 MED ORDER — LISDEXAMFETAMINE DIMESYLATE 20 MG PO CAPS: 20.0000 mg | ORAL_CAPSULE | Freq: Every day | ORAL | 0 refills | Status: DC

## 2017-03-29 MED ORDER — METFORMIN HCL ER 500 MG PO TB24: 500.0000 mg | ORAL_TABLET | Freq: Two times a day (BID) | ORAL | 3 refills | Status: DC

## 2017-03-29 MED ORDER — ALBUTEROL SULFATE HFA 108 (90 BASE) MCG/ACT IN AERS: 2.0000 | INHALATION_SPRAY | Freq: Four times a day (QID) | RESPIRATORY_TRACT | 1 refills | Status: DC | PRN

## 2017-03-29 MED ORDER — GLUCOSE BLOOD VI STRP: ORAL_STRIP | 12 refills | Status: DC

## 2017-03-29 MED ORDER — ROSUVASTATIN CALCIUM 10 MG PO TABS: 10.0000 mg | ORAL_TABLET | Freq: Every day | ORAL | 3 refills | Status: DC

## 2017-03-29 NOTE — Progress Notes (Signed)
Brett [name] is a 42 y.o. male is here for follow up.  History of Present Illness:   Brett [name], CMA acting as scribe for Dr. Briscoe Deutscher.   CC: Patient in for follow up. No new problems. Bilateral numbness in both feet. Follow up on Vyvanse.   HPI: See Assessment and Plan section for Problem Based Charting of issues discussed today.  Health Maintenance Due  Topic Date Due  . OPHTHALMOLOGY EXAM  12/30/1984  . TETANUS/TDAP  12/30/1993   Depression screen PHQ 2/9 07/29/2016 06/15/2016  Decreased Interest 0 0  Down, Depressed, Hopeless 0 0  PHQ - 2 Score 0 0   PMHx, SurgHx, SocialHx, FamHx, Medications, and Allergies were reviewed in the Visit Navigator and updated as appropriate.   Patient Active Problem List   Diagnosis Date Noted  . Shoulder impingement 06/16/2016  . Elevated LFTs 06/16/2016  . Mixed hyperlipidemia 06/16/2016  . Type 2 diabetes mellitus with diabetic polyneuropathy, without long-term current use of insulin ([place]) 06/16/2016  . Bipolar 2 disorder ([place]) 06/15/2016  . Left rotator cuff tear arthropathy 06/15/2016  . Essential hypertension 06/15/2016  . Gastroesophageal reflux disease without esophagitis 06/15/2016  . Chronic seasonal allergic rhinitis due to pollen 06/15/2016  . Peripheral polyneuropathy 06/15/2016  . Morbid obesity ([place]) 06/15/2016  . Insomnia 06/15/2016  . Chronic pain of both knees 06/15/2016  . Asthma    Social History   Tobacco Use  . Smoking status: Never Smoker  . Smokeless tobacco: Never Used  Substance Use Topics  . Alcohol use: No  . Drug use: No   Current Medications and Allergies:   .  albuterol (PROVENTIL HFA;VENTOLIN HFA) 108 (90 Base) MCG/ACT inhaler, Inhale 2 puffs into the lungs every 6 (six) hours as needed for wheezing or shortness of breath., Disp: 3 Inhaler, Rfl: 0 .  amLODipine (NORVASC) 5 MG tablet, Take 1 tablet (5 mg total) by mouth daily., Disp: 90 tablet, Rfl: 3 .  B-D UF III MINI PEN NEEDLES  31G X 5 MM MISC, USE TO INJECT VICTOZA DAILY, Disp: 100 each, Rfl: 1 .  buPROPion (WELLBUTRIN XL) 150 MG 24 hr tablet, Take 1 tablet (150 mg total) daily by mouth., Disp: 30 tablet, Rfl: 3 .  celecoxib (CELEBREX) 100 MG capsule, TAKE 1 CAPSULE (100 MG TOTAL) BY MOUTH 2 (TWO) TIMES DAILY AS NEEDED. - office visit before next refill, Disp: 60 capsule, Rfl: 0 .  diazepam (VALIUM) 10 MG tablet, Take 1 tablet (10 mg total) at bedtime as needed by mouth for anxiety., Disp: 30 tablet, Rfl: 1 .  Fexofenadine HCl (ALLEGRA ALLERGY PO), Take by mouth., Disp: , Rfl:  .  glucose blood test strip, Accu-Chek Guide - Check blood sugar 3 times per day., Disp: 100 each, Rfl: 12 .  Lancets (ACCU-CHEK MULTICLIX) lancets, Accu-Chek Guide - Check blood sugar 3 times per day., Disp: 100 each, Rfl: 12 .  liraglutide (VICTOZA) 18 MG/3ML SOPN, Inject 0.2 mLs (1.2 mg total) into the skin daily., Disp: 6 pen, Rfl: 3 .  lisdexamfetamine (VYVANSE) 20 MG capsule, Take 1 capsule (20 mg total) by mouth daily., Disp: 30 capsule, Rfl: 0 .  lisinopril (PRINIVIL,ZESTRIL) 5 MG tablet, Take 1 tablet (5 mg total) by mouth daily., Disp: 90 tablet, Rfl: 3 .  MAGNESIUM CITRATE PO, Take 1 tablet by mouth every evening., Disp: , Rfl:  .  metFORMIN (GLUCOPHAGE-XR) 500 MG 24 hr tablet, TAKE 1 TABLET (500 MG TOTAL) BY MOUTH 2 (TWO) TIMES DAILY., Disp:  60 tablet, Rfl: 3 .  omeprazole (PRILOSEC) 40 MG capsule, TAKE 1 CAPSULE BY MOUTH DAILY., Disp: 30 capsule, Rfl: 11 .  pregabalin (LYRICA) 200 MG capsule, Take 1 capsule (200 mg total) by mouth 2 (two) times daily., Disp: 60 capsule, Rfl: 0 .  rosuvastatin (CRESTOR) 10 MG tablet, TAKE 1 TABLET (10 MG TOTAL) BY MOUTH DAILY., Disp: 30 tablet, Rfl: 3 .  testosterone cypionate (DEPOTESTOSTERONE CYPIONATE) 200 MG/ML injection, Inject 1 mL (200 mg total) into the muscle every 14 (fourteen) days., Disp: 10 mL, Rfl: 3  No Known Allergies   Review of Systems   Pertinent items are noted in the HPI.  Otherwise, ROS is negative.  Vitals:   Vitals:   03/29/17 0745  BP: 126/84  Pulse: 88  Temp: 98.6 F (37 C)  TempSrc: Oral  SpO2: 97%  Weight: (!) 306 lb 9.6 oz (139.1 kg)     Body mass index is 41.58 kg/m.  Physical Exam:   Physical Exam  Constitutional: He is oriented to person, place, and time. He appears well-developed and well-nourished. No distress.  HENT:  Head: Normocephalic and atraumatic.  Right Ear: External ear normal.  Left Ear: External ear normal.  Nose: Nose normal.  Mouth/Throat: Oropharynx is clear and moist.  Eyes: Conjunctivae and EOM are normal. Pupils are equal, round, and reactive to light.  Neck: Normal range of motion. Neck supple.  Cardiovascular: Normal rate, regular rhythm, normal heart sounds and intact distal pulses.  Pulmonary/Chest: Effort normal and breath sounds normal.  Abdominal: Soft. Bowel sounds are normal.  Musculoskeletal: Normal range of motion.  Neurological: He is alert and oriented to person, place, and time.  Skin: Skin is warm and dry.  Psychiatric: He has a normal mood and affect. His behavior is normal. Judgment and thought content normal.  Nursing note and vitals reviewed.   Results for orders placed or performed in visit on 03/29/17  POCT glycosylated hemoglobin (Hb A1C)  Result Value Ref Range   Hemoglobin A1C 5.4    Assessment and Plan:   1. Type 2 diabetes mellitus with diabetic polyneuropathy, without long-term current use of insulin (HCC) Current symptoms: no polyuria or polydipsia, no chest pain, dyspnea or TIA's, has dysesthesias in the feet but improved with Lyrica.  Taking medication compliantly without noted sided effects [x]   YES  []   NO  Episodes of hypoglycemia? []   YES  [x]   NO Maintaining a diabetic diet? [x]   YES  []   NO Trying to exercise on a regular basis? [x]   YES  []   NO  On ACE inhibitor or angiotensin II receptor blocker? [x]   YES  []   NO On Aspirin? []   YES  [x]   NO  Lab Results    Component Value Date   HGBA1C 5.4 03/29/2017    No results found for: Derl Barrow  Lab Results  Component Value Date   CHOL 150 12/19/2016   HDL 38.50 (L) 12/19/2016   LDLCALC 84 12/19/2016   TRIG 137.0 12/19/2016   CHOLHDL 4 12/19/2016     Wt Readings from Last 3 Encounters:  03/29/17 (!) 306 lb 9.6 oz (139.1 kg)  03/03/17 (!) 306 lb (138.8 kg)  02/01/17 (!) 319 lb 3.2 oz (144.8 kg)   BP Readings from Last 3 Encounters:  03/29/17 126/84  03/03/17 128/84  02/01/17 (!) 148/86   Lab Results  Component Value Date   CREATININE 0.80 12/19/2016   Doing very well. Continue current regimen.  - glucose blood test  strip; Accu-Chek Guide - Check blood sugar 3 times per day.  Dispense: 100 each; Refill: 12 - Lancets (ACCU-CHEK MULTICLIX) lancets; Accu-Chek Guide - Check blood sugar 3 times per day.  Dispense: 100 each; Refill: 12 - liraglutide (VICTOZA) 18 MG/3ML SOPN; Inject 0.2 mLs (1.2 mg total) into the skin daily.  Dispense: 6 pen; Refill: 3 - metFORMIN (GLUCOPHAGE-XR) 500 MG 24 hr tablet; Take 1 tablet (500 mg total) by mouth 2 (two) times daily.  Dispense: 60 tablet; Refill: 3 - POCT glycosylated hemoglobin (Hb A1C)  2. Morbid obesity ([place]) Patient disappointed in lack of weight loss. He has been exercising. Admits to poorer diet during the holidays. Ready to work on weight loss again.   3. Essential hypertension Stable. Well controlled.  No signs of complications, medication side effects, or red flags.  Continue current regimen.    - lisinopril (PRINIVIL,ZESTRIL) 5 MG tablet; Take 1 tablet (5 mg total) by mouth daily.  Dispense: 90 tablet; Refill: 3  4. Mixed hyperlipidemia Well controlled.  No signs of complications, medication side effects, or red flags.  Continue current regimen.    - rosuvastatin (CRESTOR) 10 MG tablet; Take 1 tablet (10 mg total) by mouth daily.  Dispense: 30 tablet; Refill: 3  5. Hypogonadism in male Recently started testosterone  injections. Feels that they do help. Interested in increasing the dose when able.   6. Binge eating Well controlled.  No signs of complications, medication side effects, or red flags.  Continue current regimen.    7. Moodiness Marriage communication issues. We spoke about this for quite a while. Provided info for [facility]. He asked me to email his wife (also my patient) to stress the importance of this.  . Reviewed expectations re: course of current medical issues. . Discussed self-management of symptoms. . Outlined signs and symptoms indicating need for more acute intervention. . Patient verbalized understanding and all questions were answered. Marland Kitchen Health Maintenance issues including appropriate healthy diet, exercise, and smoking avoidance were discussed with patient. . See orders for this visit as documented in the electronic medical record. . Patient received an After Visit Summary.  CMA served as Education administrator during this visit. History, Physical, and Plan performed by medical provider. The above documentation has been reviewed and is accurate and complete. Briscoe Deutscher, D.O.   Briscoe Deutscher, DO [place] 03/29/2017

## 2017-03-30 ENCOUNTER — Other Ambulatory Visit: Payer: Self-pay

## 2017-03-30 MED ORDER — GLUCOSE BLOOD VI STRP: ORAL_STRIP | 6 refills | Status: DC

## 2017-04-25 ENCOUNTER — Encounter: Payer: Self-pay | Admitting: Family Medicine

## 2017-05-08 ENCOUNTER — Encounter: Payer: Self-pay | Admitting: Family Medicine

## 2017-05-08 ENCOUNTER — Ambulatory Visit: Payer: BC Managed Care – PPO | Admitting: Family Medicine

## 2017-05-08 VITALS — BP 130/80 | HR 94 | Temp 98.6°F | Wt 311.6 lb

## 2017-05-08 DIAGNOSIS — J0100 Acute maxillary sinusitis, unspecified: Secondary | ICD-10-CM | POA: Diagnosis not present

## 2017-05-08 MED ORDER — METHYLPREDNISOLONE ACETATE 80 MG/ML IJ SUSP
80.0000 mg | Freq: Once | INTRAMUSCULAR | Status: AC
  Administered 2017-05-08: 80 mg via INTRAMUSCULAR

## 2017-05-08 MED ORDER — CEFDINIR 300 MG PO CAPS: 300.0000 mg | ORAL_CAPSULE | Freq: Two times a day (BID) | ORAL | 0 refills | Status: AC

## 2017-05-09 NOTE — Progress Notes (Signed)
History of Present Illness:   Brett [name] is a 42 y.o. male who presents for evaluation of possible sinus infection. Symptoms include achiness, cough described as productive of purulent sputum, post nasal drip, purulent nasal discharge, sinus pressure and fatigue with no fever, chills, night sweats or weight loss. Onset of symptoms was 1 week ago, gradually worsening since that time.  He is drinking plenty of fluids. Patient is a non-smoker.  PMHx, SurgHx, SocialHx, Medications, and Allergies were reviewed in the Visit Navigator and updated as appropriate.   Past Medical History:   Past Medical History:  Diagnosis Date  . Asthma   . Bipolar 1 disorder ([place]) 06/15/2016   Previously followed by Psychiatry. Hx of "anger issues." Hx of medication use has included Seroquel and Ativan, both of which have helped him in the past.   . Chronic pain of both knees 06/15/2016  . Chronic seasonal allergic rhinitis due to pollen 06/15/2016  . Depression   . Gastroesophageal reflux disease without esophagitis 06/15/2016  . Hypertension   . Insomnia 06/15/2016  . Left rotator cuff tear arthropathy 06/15/2016   s/p repair and PT. No ongoing issues.  . Morbid obesity ([place]) 06/15/2016   Past Surgical History:   Past Surgical History:  Procedure Laterality Date  . BICEPS TENDON REPAIR     Allergies:  No Known Allergies  Current Medications:   Prior to Admission medications   Medication Sig Start Date End Date Taking? Authorizing Provider  albuterol (PROVENTIL HFA;VENTOLIN HFA) 108 (90 Base) MCG/ACT inhaler Inhale 2 puffs into the lungs every 6 (six) hours as needed for wheezing or shortness of breath. 03/29/17  Yes Briscoe Deutscher, DO  amLODipine (NORVASC) 5 MG tablet Take 1 tablet (5 mg total) by mouth daily. 03/29/17  Yes Briscoe Deutscher, DO  B-D UF III MINI PEN NEEDLES 31G X 5 MM MISC USE TO INJECT VICTOZA DAILY 12/15/16  Yes Briscoe Deutscher, DO  buPROPion (WELLBUTRIN XL) 150 MG 24 hr tablet Take  1 tablet (150 mg total) daily by mouth. 02/03/17  Yes Briscoe Deutscher, DO  celecoxib (CELEBREX) 100 MG capsule TAKE 1 CAPSULE (100 MG TOTAL) BY MOUTH 2 (TWO) TIMES DAILY AS NEEDED. - office visit before next refill 03/29/17  Yes Briscoe Deutscher, DO  Fexofenadine HCl (ALLEGRA ALLERGY PO) Take by mouth.   Yes [provider]  glucose blood test strip Accu-Chek Guide - Check blood sugar 3 times per day. 03/29/17  Yes Briscoe Deutscher, DO  glucose blood test strip One Touch Verio 03/30/17  Yes Briscoe Deutscher, DO  Lancets (ACCU-CHEK MULTICLIX) lancets Accu-Chek Guide - Check blood sugar 3 times per day. 03/29/17  Yes Briscoe Deutscher, DO  liraglutide (VICTOZA) 18 MG/3ML SOPN Inject 0.2 mLs (1.2 mg total) into the skin daily. 03/29/17  Yes Briscoe Deutscher, DO  lisdexamfetamine (VYVANSE) 20 MG capsule Take 1 capsule (20 mg total) by mouth daily. 03/03/17  Yes Briscoe Deutscher, DO  lisdexamfetamine (VYVANSE) 20 MG capsule Take 1 capsule (20 mg total) by mouth daily. Do not fill before 04/04/16 03/29/17  Yes Briscoe Deutscher, DO  lisdexamfetamine (VYVANSE) 20 MG capsule Take 1 capsule (20 mg total) by mouth daily. Do not fill before 05/05/16 03/29/17  Yes Briscoe Deutscher, DO  lisdexamfetamine (VYVANSE) 20 MG capsule Take 1 capsule (20 mg total) by mouth daily. Do not fill before 06/02/16 03/29/17  Yes Briscoe Deutscher, DO  lisinopril (PRINIVIL,ZESTRIL) 5 MG tablet Take 1 tablet (5 mg total) by mouth daily. 03/29/17  Yes Juleen China,  Panzy Bubeck, DO  MAGNESIUM CITRATE PO Take 1 tablet by mouth every evening.   Yes [provider]  metFORMIN (GLUCOPHAGE-XR) 500 MG 24 hr tablet Take 1 tablet (500 mg total) by mouth 2 (two) times daily. 03/29/17  Yes Briscoe Deutscher, DO  omeprazole (PRILOSEC) 40 MG capsule TAKE 1 CAPSULE BY MOUTH DAILY. 10/03/16  Yes Briscoe Deutscher, DO  pregabalin (LYRICA) 200 MG capsule Take 1 capsule (200 mg total) by mouth 2 (two) times daily. 03/29/17  Yes Briscoe Deutscher, DO  rosuvastatin (CRESTOR) 10 MG tablet Take 1 tablet (10  mg total) by mouth daily. 03/29/17  Yes Briscoe Deutscher, DO  testosterone cypionate (DEPOTESTOSTERONE CYPIONATE) 200 MG/ML injection Inject 1 mL (200 mg total) into the muscle every 14 (fourteen) days. 03/29/17  Yes Briscoe Deutscher, DO    Social History:   Social History   Tobacco Use  . Smoking status: Never Smoker  . Smokeless tobacco: Never Used  Substance Use Topics  . Alcohol use: No  . Drug use: No    Review of Systems:   No unusual headaches, no dizziness. No dyspnea or chest pain on exertion. No abdominal pain, change in bowel habits, black or bloody stools.  No urinary tract symptoms. No new or unusual musculoskeletal symptoms. No edema.  Vitals:   Vitals:   05/08/17 1502  BP: 130/80  Pulse: 94  Temp: 98.6 F (37 C)  TempSrc: Oral  SpO2: 94%  Weight: (!) 311 lb 9.6 oz (141.3 kg)     Body mass index is 42.26 kg/m.  Physical Exam:   General: alert, cooperative, in NAD Eyes: PERRL, EOMI, conjunctiva clear HENT: oropharynx clear, moist mucous membranes, posterior pharyngeal erythema, PND, bilateral maxillary and frontal sinus ttp Cardiovascular: regular rate and rhythm, S1, S2 normal, no murmur, click, rub or gallop Respiratory: Cough Abdomen: soft, non-tender; bowel sounds normal; no masses,  no organomegaly Extremities: extremities normal, atraumatic, no cyanosis or edema Pulses: 2+ and symmetric Skin: Skin color, texture, turgor normal. No rashes or lesions  Assessment and Plan:   Brett [name] was seen today for cough.  Diagnoses and all orders for this visit:  Subacute maxillary sinusitis -     cefdinir (OMNICEF) 300 MG capsule; Take 1 capsule (300 mg total) by mouth 2 (two) times daily for 10 days. -     methylPREDNISolone acetate (DEPO-MEDROL) injection 80 mg   . Reviewed expectations re: course of current medical issues. . Discussed self-management of symptoms. . Outlined signs and symptoms indicating need for more acute intervention. . Patient verbalized  understanding and all questions were answered. . See orders for this visit as documented in the electronic medical record. . Patient received an After Visit Summary.  Briscoe Deutscher, D.O.

## 2017-05-16 ENCOUNTER — Encounter: Payer: Self-pay | Admitting: Family Medicine

## 2017-05-18 ENCOUNTER — Encounter: Payer: Self-pay | Admitting: Family Medicine

## 2017-05-18 ENCOUNTER — Ambulatory Visit: Payer: BC Managed Care – PPO | Admitting: Family Medicine

## 2017-05-18 ENCOUNTER — Other Ambulatory Visit: Payer: BC Managed Care – PPO

## 2017-05-18 VITALS — BP 140/80 | HR 100 | Temp 98.4°F | Wt 305.8 lb

## 2017-05-18 DIAGNOSIS — E291 Testicular hypofunction: Secondary | ICD-10-CM | POA: Diagnosis not present

## 2017-05-18 DIAGNOSIS — Z23 Encounter for immunization: Secondary | ICD-10-CM | POA: Diagnosis not present

## 2017-05-18 DIAGNOSIS — F319 Bipolar disorder, unspecified: Secondary | ICD-10-CM | POA: Diagnosis not present

## 2017-05-18 DIAGNOSIS — F5101 Primary insomnia: Secondary | ICD-10-CM

## 2017-05-18 DIAGNOSIS — M2550 Pain in unspecified joint: Secondary | ICD-10-CM

## 2017-05-18 DIAGNOSIS — E1142 Type 2 diabetes mellitus with diabetic polyneuropathy: Secondary | ICD-10-CM

## 2017-05-18 MED ORDER — HYDROCODONE-ACETAMINOPHEN 5-325 MG PO TABS: 1.0000 | ORAL_TABLET | Freq: Four times a day (QID) | ORAL | 0 refills | Status: DC | PRN

## 2017-05-18 MED ORDER — DIAZEPAM 10 MG PO TABS: 10.0000 mg | ORAL_TABLET | Freq: Every evening | ORAL | 1 refills | Status: DC | PRN

## 2017-05-19 ENCOUNTER — Other Ambulatory Visit: Payer: Self-pay | Admitting: Family Medicine

## 2017-05-19 LAB — COMPREHENSIVE METABOLIC PANEL
ALT: 24 U/L (ref 0–53)
AST: 29 U/L (ref 0–37)
Albumin: 4.5 g/dL (ref 3.5–5.2)
Alkaline Phosphatase: 42 U/L (ref 39–117)
BUN: 11 mg/dL (ref 6–23)
CO2: 27 mEq/L (ref 19–32)
Calcium: 9.6 mg/dL (ref 8.4–10.5)
Chloride: 99 mEq/L (ref 96–112)
Creatinine, Ser: 0.86 mg/dL (ref 0.40–1.50)
GFR: 103.46 mL/min (ref >60.00)
Glucose, Bld: 83 mg/dL (ref 70–99)
Potassium: 4 mEq/L (ref 3.5–5.1)
Sodium: 137 mEq/L (ref 135–145)
Total Bilirubin: 0.7 mg/dL (ref 0.2–1.2)
Total Protein: 7.4 g/dL (ref 6.0–8.3)

## 2017-05-19 LAB — CBC WITH DIFFERENTIAL/PLATELET
Basophils Absolute: 0.1 10*3/uL (ref 0.0–0.1)
Basophils Relative: 0.7 % (ref 0.0–3.0)
Eosinophils Absolute: 0.2 10*3/uL (ref 0.0–0.7)
Eosinophils Relative: 1.7 % (ref 0.0–5.0)
HCT: 51 % (ref 39.0–52.0)
Hemoglobin: 16.8 g/dL (ref 13.0–17.0)
Lymphocytes Relative: 28.8 % (ref 12.0–46.0)
Lymphs Abs: 2.7 10*3/uL (ref 0.7–4.0)
MCHC: 33 g/dL (ref 30.0–36.0)
MCV: 86.2 fl (ref 78.0–100.0)
Monocytes Absolute: 0.9 10*3/uL (ref 0.1–1.0)
Monocytes Relative: 9.4 % (ref 3.0–12.0)
Neutro Abs: 5.5 10*3/uL (ref 1.4–7.7)
Neutrophils Relative %: 59.4 % (ref 43.0–77.0)
Platelets: 188 10*3/uL (ref 150.0–400.0)
RBC: 5.92 Mil/uL — ABNORMAL HIGH (ref 4.22–5.81)
RDW: 14.3 % (ref 11.5–15.5)
WBC: 9.2 10*3/uL (ref 4.0–10.5)

## 2017-05-19 LAB — RHEUMATOID FACTOR: Rhuematoid fact SerPl-aCnc: <14 IU/mL (ref <14)

## 2017-05-20 ENCOUNTER — Other Ambulatory Visit: Payer: Self-pay | Admitting: Family Medicine

## 2017-05-20 ENCOUNTER — Encounter: Payer: Self-pay | Admitting: Family Medicine

## 2017-05-20 DIAGNOSIS — E291 Testicular hypofunction: Secondary | ICD-10-CM | POA: Insufficient documentation

## 2017-05-20 DIAGNOSIS — M2550 Pain in unspecified joint: Secondary | ICD-10-CM | POA: Insufficient documentation

## 2017-05-20 NOTE — Progress Notes (Signed)
Brett [name] is a 42 y.o. male is here for follow up.  History of Present Illness:   HPI: See Assessment and Plan section for Problem Based Charting of issues discussed today.   Health Maintenance Due  Topic Date Due  . OPHTHALMOLOGY EXAM  12/30/1984   Depression screen PHQ 2/9 07/29/2016 06/15/2016  Decreased Interest 0 0  Down, Depressed, Hopeless 0 0  PHQ - 2 Score 0 0   PMHx, SurgHx, SocialHx, FamHx, Medications, and Allergies were reviewed in the Visit Navigator and updated as appropriate.   Patient Active Problem List   Diagnosis Date Noted  . Multiple joint pain 05/20/2017  . Bipolar 1 disorder ([place]) 05/20/2017  . Hypogonadism in male 05/20/2017  . Shoulder impingement 06/16/2016  . Elevated LFTs 06/16/2016  . Mixed hyperlipidemia 06/16/2016  . Type 2 diabetes mellitus with diabetic polyneuropathy, without long-term current use of insulin ([place]) 06/16/2016  . Bipolar 2 disorder ([place]) 06/15/2016  . Left rotator cuff tear arthropathy 06/15/2016  . Essential hypertension 06/15/2016  . Gastroesophageal reflux disease without esophagitis 06/15/2016  . Chronic seasonal allergic rhinitis due to pollen 06/15/2016  . Peripheral polyneuropathy 06/15/2016  . Morbid obesity ([place]) 06/15/2016  . Insomnia 06/15/2016  . Chronic pain of both knees 06/15/2016  . Asthma    Social History   Tobacco Use  . Smoking status: Never Smoker  . Smokeless tobacco: Never Used  Substance Use Topics  . Alcohol use: No  . Drug use: No   Current Medications and Allergies:   .  albuterol (PROVENTIL HFA;VENTOLIN HFA) 108 (90 Base) MCG/ACT inhaler, Inhale 2 puffs into the lungs every 6 (six) hours as needed for wheezing or shortness of breath., Disp: 3 Inhaler, Rfl: 1 .  amLODipine (NORVASC) 5 MG tablet, Take 1 tablet (5 mg total) by mouth daily., Disp: 90 tablet, Rfl: 3 .  celecoxib (CELEBREX) 100 MG capsule, TAKE 1 CAPSULE (100 MG TOTAL) BY MOUTH 2 (TWO) TIMES DAILY AS NEEDED. - office  visit before next refill, Disp: 60 capsule, Rfl: 1 .  Fexofenadine HCl (ALLEGRA ALLERGY PO), Take by mouth., Disp: , Rfl:  .  liraglutide (VICTOZA) 18 MG/3ML SOPN, Inject 0.2 mLs (1.2 mg total) into the skin daily., Disp: 6 pen, Rfl: 3 .  lisdexamfetamine (VYVANSE) 20 MG capsule, Take 1 capsule (20 mg total) by mouth daily. Do not fill before 05/05/16, Disp: 30 capsule, Rfl: 0 .  lisinopril (PRINIVIL,ZESTRIL) 5 MG tablet, Take 1 tablet (5 mg total) by mouth daily., Disp: 90 tablet, Rfl: 3 .  MAGNESIUM CITRATE PO, Take 1 tablet by mouth every evening., Disp: , Rfl:  .  metFORMIN (GLUCOPHAGE-XR) 500 MG 24 hr tablet, Take 1 tablet (500 mg total) by mouth 2 (two) times daily., Disp: 60 tablet, Rfl: 3 .  omeprazole (PRILOSEC) 40 MG capsule, TAKE 1 CAPSULE BY MOUTH DAILY., Disp: 30 capsule, Rfl: 11 .  pregabalin (LYRICA) 200 MG capsule, Take 1 capsule (200 mg total) by mouth 2 (two) times daily., Disp: 60 capsule, Rfl: 0 .  rosuvastatin (CRESTOR) 10 MG tablet, Take 1 tablet (10 mg total) by mouth daily., Disp: 30 tablet, Rfl: 3 .  testosterone cypionate (DEPOTESTOSTERONE CYPIONATE) 200 MG/ML injection, Inject 1 mL (200 mg total) into the muscle every 14 (fourteen) days., Disp: 10 mL, Rfl: 3 .  buPROPion (WELLBUTRIN XL) 150 MG 24 hr tablet, TAKE 1 TABLET (150 MG TOTAL) DAILY BY MOUTH., Disp: 30 tablet, Rfl: 3  No Known Allergies   Review of Systems  Pertinent items are noted in the HPI. Otherwise, ROS is negative.  Vitals:   Vitals:   05/18/17 1050  BP: 140/80  Pulse: 100  Temp: 98.4 F (36.9 C)  TempSrc: Oral  SpO2: 97%  Weight: (!) 305 lb 12.8 oz (138.7 kg)     Body mass index is 41.47 kg/m.   Physical Exam:   Physical Exam  Constitutional: He is oriented to person, place, and time. He appears well-developed and well-nourished. No distress.  HENT:  Head: Normocephalic and atraumatic.  Right Ear: External ear normal.  Left Ear: External ear normal.  Nose: Nose normal.    Mouth/Throat: Oropharynx is clear and moist.  Eyes: Conjunctivae and EOM are normal. Pupils are equal, round, and reactive to light.  Neck: Normal range of motion. Neck supple.  Cardiovascular: Normal rate, regular rhythm, normal heart sounds and intact distal pulses.  Pulmonary/Chest: Effort normal and breath sounds normal.  Abdominal: Soft. Bowel sounds are normal.  Musculoskeletal: Normal range of motion.       Right elbow: Tenderness found.       Left elbow: Tenderness found.       Right knee: Tenderness found. Medial joint line and lateral joint line tenderness noted.       Left knee: Tenderness found. Medial joint line and lateral joint line tenderness noted.  Neurological: He is alert and oriented to person, place, and time.  Skin: Skin is warm and dry.  Psychiatric: He has a normal mood and affect. His behavior is normal. Judgment and thought content normal.  Nursing note and vitals reviewed.   Assessment and Plan:   Brett [name] was seen today for joint swelling.  Diagnoses and all orders for this visit:  Multiple joint pain Comments: Likely all OA, but will check RF today. I encouraged less power lifting, more machine use. Okay to continue Celebrex. Okay Tumeric at 1000 mg daily. Okay Norco to use sparingly.  Orders: -     HYDROcodone-acetaminophen (NORCO/VICODIN) 5-325 MG tablet; Take 1 tablet by mouth every 6 (six) hours as needed for moderate pain. -     Rheumatoid Factor  Primary insomnia Comments: Okay prn Valium. He uses sparingly. Discussed sleep hygeine.  Orders: -     diazepam (VALIUM) 10 MG tablet; Take 1 tablet (10 mg total) by mouth at bedtime as needed for anxiety.  Need for Tdap vaccination -     Tdap vaccine greater than or equal to 7yo IM  Morbid obesity ([place]) Comments: Weight > 300 but patient has made several healthy changes. He is exercising regularly. Medications to encourage weight loss: Vyvanse, Victoza, Wellbutrin.   Wt Readings from Last 3  Encounters:  05/18/17 (!) 305 lb 12.8 oz (138.7 kg)  05/08/17 (!) 311 lb 9.6 oz (141.3 kg)  03/29/17 (!) 306 lb 9.6 oz (139.1 kg)   Type 2 diabetes mellitus with diabetic polyneuropathy, without long-term current use of insulin (HCC) -     CBC with Differential/Platelet -     Comprehensive metabolic panel  Bipolar 1 disorder ([place]) Comments: Doing well. Mood good.  Hypogonadism in male Comments: Feeling great with testosterone treatment. Due for recheck on 06/01/17. Labs to include testosterone, PSA, CBC, CMP.   Marland Kitchen Reviewed expectations re: course of current medical issues. . Discussed self-management of symptoms. . Outlined signs and symptoms indicating need for more acute intervention. . Patient verbalized understanding and all questions were answered. Marland Kitchen Health Maintenance issues including appropriate healthy diet, exercise, and smoking avoidance were discussed with patient. Marland Kitchen  See orders for this visit as documented in the electronic medical record. . Patient received an After Visit Summary.  Briscoe Deutscher, DO [place] 05/20/2017  Future Appointments  Date Time Provider [place]  06/28/2017  7:20 AM Briscoe Deutscher, DO [facility]

## 2017-05-21 ENCOUNTER — Other Ambulatory Visit: Payer: Self-pay | Admitting: Family Medicine

## 2017-05-23 ENCOUNTER — Encounter: Payer: Self-pay | Admitting: Family Medicine

## 2017-05-23 NOTE — Telephone Encounter (Signed)
Okay 

## 2017-05-23 NOTE — Telephone Encounter (Signed)
Ok to refill 

## 2017-05-24 ENCOUNTER — Other Ambulatory Visit: Payer: Self-pay | Admitting: Family Medicine

## 2017-05-25 ENCOUNTER — Encounter: Payer: Self-pay | Admitting: Family Medicine

## 2017-05-25 ENCOUNTER — Other Ambulatory Visit: Payer: Self-pay

## 2017-05-25 MED ORDER — "NEEDLE (DISP) 22G X 1-1/2"" MISC": 1.0000 | 12 refills | Status: DC

## 2017-06-04 ENCOUNTER — Encounter: Payer: Self-pay | Admitting: Family Medicine

## 2017-06-05 ENCOUNTER — Encounter: Payer: Self-pay | Admitting: Family Medicine

## 2017-06-05 ENCOUNTER — Ambulatory Visit: Payer: BC Managed Care – PPO | Admitting: Family Medicine

## 2017-06-05 VITALS — BP 138/90 | HR 82 | Temp 98.3°F | Ht 72.0 in | Wt 307.6 lb

## 2017-06-05 DIAGNOSIS — J069 Acute upper respiratory infection, unspecified: Secondary | ICD-10-CM

## 2017-06-05 MED ORDER — AZITHROMYCIN 250 MG PO TABS: ORAL_TABLET | ORAL | 0 refills | Status: DC

## 2017-06-07 NOTE — Progress Notes (Signed)
Brett [name] is a 42 y.o. male here for an acute visit.  History of Present Illness:   URI   This is a new problem. The current episode started in the past 7 days. The problem has been gradually worsening. There has been no fever. Associated symptoms include congestion, coughing, headaches, rhinorrhea, sinus pain, a sore throat and swollen glands. He has tried nothing for the symptoms.   PMHx, SurgHx, SocialHx, Medications, and Allergies were reviewed in the Visit Navigator and updated as appropriate.  Current Medications:   Current Outpatient Medications:  .  albuterol (PROVENTIL HFA;VENTOLIN HFA) 108 (90 Base) MCG/ACT inhaler, Inhale 2 puffs into the lungs every 6 (six) hours as needed for wheezing or shortness of breath., Disp: 3 Inhaler, Rfl: 1 .  amLODipine (NORVASC) 5 MG tablet, Take 1 tablet (5 mg total) by mouth daily., Disp: 90 tablet, Rfl: 3 .  B-D 3CC LUER-LOK SYR 23GX1" 23G X 1" 3 ML MISC, USE AS DIRECTED, Disp: 100 each, Rfl: 0 .  B-D UF III MINI PEN NEEDLES 31G X 5 MM MISC, USE TO INJECT VICTOZA DAILY, Disp: 100 each, Rfl: 1 .  buPROPion (WELLBUTRIN XL) 150 MG 24 hr tablet, TAKE 1 TABLET (150 MG TOTAL) DAILY BY MOUTH., Disp: 30 tablet, Rfl: 3 .  celecoxib (CELEBREX) 100 MG capsule, TAKE 1 CAPSULE (100 MG TOTAL) BY MOUTH 2 (TWO) TIMES DAILY AS NEEDED.OFFICE VISIT BEFORE NEXT REFILL, Disp: 60 capsule, Rfl: 1 .  diazepam (VALIUM) 10 MG tablet, Take 1 tablet (10 mg total) by mouth at bedtime as needed for anxiety., Disp: 30 tablet, Rfl: 1 .  Fexofenadine HCl (ALLEGRA ALLERGY PO), Take by mouth., Disp: , Rfl:  .  glucose blood test strip, Accu-Chek Guide - Check blood sugar 3 times per day., Disp: 100 each, Rfl: 12 .  HYDROcodone-acetaminophen (NORCO/VICODIN) 5-325 MG tablet, Take 1 tablet by mouth every 6 (six) hours as needed for moderate pain., Disp: 20 tablet, Rfl: 0 .  Lancets (ACCU-CHEK MULTICLIX) lancets, Accu-Chek Guide - Check blood sugar 3 times per day., Disp:  100 each, Rfl: 12 .  liraglutide (VICTOZA) 18 MG/3ML SOPN, Inject 0.2 mLs (1.2 mg total) into the skin daily., Disp: 6 pen, Rfl: 3 .  lisdexamfetamine (VYVANSE) 20 MG capsule, Take 1 capsule (20 mg total) by mouth daily. Do not fill before 05/05/16, Disp: 30 capsule, Rfl: 0 .  lisdexamfetamine (VYVANSE) 20 MG capsule, Take 1 capsule (20 mg total) by mouth daily. Do not fill before 06/02/16, Disp: 30 capsule, Rfl: 0 .  lisinopril (PRINIVIL,ZESTRIL) 5 MG tablet, Take 1 tablet (5 mg total) by mouth daily., Disp: 90 tablet, Rfl: 3 .  LYRICA 200 MG capsule, TAKE ONE CAPSULE BY MOUTH TWICE A DAY, Disp: 60 capsule, Rfl: 0 .  MAGNESIUM CITRATE PO, Take 1 tablet by mouth every evening., Disp: , Rfl:  .  metFORMIN (GLUCOPHAGE-XR) 500 MG 24 hr tablet, Take 1 tablet (500 mg total) by mouth 2 (two) times daily., Disp: 60 tablet, Rfl: 3 .  NEEDLE, DISP, 22 G 22G X 1-1/2" MISC, 1 each by Does not apply route once a week., Disp: 4 each, Rfl: 12 .  omeprazole (PRILOSEC) 40 MG capsule, TAKE 1 CAPSULE BY MOUTH DAILY., Disp: 30 capsule, Rfl: 5 .  rosuvastatin (CRESTOR) 10 MG tablet, Take 1 tablet (10 mg total) by mouth daily., Disp: 30 tablet, Rfl: 3 .  testosterone cypionate (DEPOTESTOSTERONE CYPIONATE) 200 MG/ML injection, Inject 1 mL (200 mg total) into the muscle every 14 (  fourteen) days., Disp: 10 mL, Rfl: 3 .  azithromycin (ZITHROMAX) 250 MG tablet, Take two tablets first day then one  Daily until finished., Disp: 6 each, Rfl: 0   No Known Allergies Review of Systems:   Pertinent items are noted in the HPI. Otherwise, ROS is negative.  Vitals:   Vitals:   06/05/17 1343  BP: 138/90  Pulse: 82  Temp: 98.3 F (36.8 C)  TempSrc: Oral  SpO2: 95%  Weight: (!) 307 lb 9.6 oz (139.5 kg)  Height: 6' (1.829 m)     Body mass index is 41.72 kg/m.  Physical Exam:   Physical Exam  Constitutional: He is oriented to person, place, and time. He appears well-developed and well-nourished. No distress.  HENT:    Head: Normocephalic and atraumatic.  Right Ear: External ear normal.  Left Ear: External ear normal.  Nose: Mucosal edema and rhinorrhea present.  Mouth/Throat: Posterior oropharyngeal erythema present.  Eyes: Conjunctivae and EOM are normal. Pupils are equal, round, and reactive to light.  Neck: Normal range of motion. Neck supple.  Cardiovascular: Normal rate, regular rhythm, normal heart sounds and intact distal pulses.  Pulmonary/Chest: Effort normal and breath sounds normal.  Cough.  Abdominal: Soft. Bowel sounds are normal.  Musculoskeletal: Normal range of motion.  Neurological: He is alert and oriented to person, place, and time.  Skin: Skin is warm and dry.  Psychiatric: He has a normal mood and affect. His behavior is normal. Judgment and thought content normal.  Nursing note and vitals reviewed.   Assessment and Plan:   1. Viral upper respiratory tract infection  . Reviewed expectations re: course of current medical issues. . Discussed self-management of symptoms. . Outlined signs and symptoms indicating need for more acute intervention. . Patient verbalized understanding and all questions were answered. Marland Kitchen Health Maintenance issues including appropriate healthy diet, exercise, and smoking avoidance were discussed with patient. . See orders for this visit as documented in the electronic medical record. . Patient received an After Visit Summary.  Briscoe Deutscher, DO [place], [facility] 06/07/2017

## 2017-06-25 ENCOUNTER — Other Ambulatory Visit: Payer: Self-pay | Admitting: Family Medicine

## 2017-06-26 ENCOUNTER — Encounter: Payer: Self-pay | Admitting: Family Medicine

## 2017-06-26 ENCOUNTER — Other Ambulatory Visit: Payer: Self-pay | Admitting: Family Medicine

## 2017-06-26 MED ORDER — PREGABALIN 200 MG PO CAPS: 200.0000 mg | ORAL_CAPSULE | Freq: Two times a day (BID) | ORAL | 3 refills | Status: DC

## 2017-06-26 NOTE — Telephone Encounter (Signed)
Pt following up on med request. Pt states he is out and did not know it did not have refills. Pt states he has severe pain and would appreciate asap  [id number] [address], [place] [phone] (Phone) [phone] (Fax)

## 2017-06-28 ENCOUNTER — Encounter: Payer: Self-pay | Admitting: Family Medicine

## 2017-06-28 ENCOUNTER — Ambulatory Visit: Payer: BC Managed Care – PPO | Admitting: Family Medicine

## 2017-06-28 VITALS — BP 114/76 | HR 90 | Temp 97.5°F | Ht 72.0 in | Wt 294.6 lb

## 2017-06-28 DIAGNOSIS — E291 Testicular hypofunction: Secondary | ICD-10-CM | POA: Diagnosis not present

## 2017-06-28 DIAGNOSIS — G629 Polyneuropathy, unspecified: Secondary | ICD-10-CM | POA: Diagnosis not present

## 2017-06-28 DIAGNOSIS — M2550 Pain in unspecified joint: Secondary | ICD-10-CM | POA: Diagnosis not present

## 2017-06-28 DIAGNOSIS — R632 Polyphagia: Secondary | ICD-10-CM | POA: Diagnosis not present

## 2017-06-28 LAB — PSA: PSA: 0.87 ng/mL (ref 0.10–4.00)

## 2017-06-28 MED ORDER — LISDEXAMFETAMINE DIMESYLATE 20 MG PO CAPS: 20.0000 mg | ORAL_CAPSULE | Freq: Every day | ORAL | 0 refills | Status: DC

## 2017-06-28 MED ORDER — INSULIN PEN NEEDLE 31G X 5 MM MISC: 1 refills | Status: DC

## 2017-06-28 NOTE — Progress Notes (Signed)
Brett [name] is a 42 y.o. male is here for follow up.  History of Present Illness:   HPI: Doing fairly well today.  He feels that all of his medications are working well.  He is exercising more regularly and has lost 11 pounds since her last visit.  As always, he does not feel that that is enough.  He feels that the testosterone has been very helpful for his energy level and libido.  He has been working on communication with his wife and feels that this is improving somewhat.  His main concern today is that he continues to have severe knee pain, especially when working out, which is heavy weights.  Health Maintenance Due  Topic Date Due  . OPHTHALMOLOGY EXAM  12/30/1984   Depression screen PHQ 2/9 07/29/2016 06/15/2016  Decreased Interest 0 0  Down, Depressed, Hopeless 0 0  PHQ - 2 Score 0 0   PMHx, SurgHx, SocialHx, FamHx, Medications, and Allergies were reviewed in the Visit Navigator and updated as appropriate.   Patient Active Problem List   Diagnosis Date Noted  . Binge eating 06/28/2017  . Multiple joint pain 05/20/2017  . Hypogonadism in male 05/20/2017  . Shoulder impingement 06/16/2016  . Elevated LFTs 06/16/2016  . Hyperlipidemia associated with type 2 diabetes mellitus ([place]) 06/16/2016  . Type 2 diabetes mellitus with diabetic polyneuropathy, without long-term current use of insulin ([place]) 06/16/2016  . Bipolar 2 disorder ([place]) 06/15/2016  . Left rotator cuff tear arthropathy 06/15/2016  . Hypertension associated with diabetes ([place]) 06/15/2016  . Gastroesophageal reflux disease without esophagitis 06/15/2016  . Chronic seasonal allergic rhinitis due to pollen 06/15/2016  . Peripheral polyneuropathy 06/15/2016  . Morbid obesity ([place]) 06/15/2016  . Insomnia 06/15/2016  . Chronic pain of both knees 06/15/2016  . Asthma    Social History   Tobacco Use  . Smoking status: Never Smoker  . Smokeless tobacco: Never Used  Substance Use Topics  . Alcohol use: No  .  Drug use: No   Current Medications and Allergies:   .  albuterol (PROVENTIL HFA;VENTOLIN HFA) 108 (90 Base) MCG/ACT inhaler q 4 hours prn SOB/wheeze. Marland Kitchen  amLODipine (NORVASC) 5 MG tablet, Take 1 tablet (5 mg total) by mouth daily., Disp: 90 tablet, Rfl: 3 .  buPROPion (WELLBUTRIN XL) 150 MG 24 hr tablet, TAKE 1 TABLET (150 MG TOTAL) DAILY BY MOUTH., Disp: 30 tablet, Rfl: 3 .  celecoxib (CELEBREX) 100 MG capsule, TAKE 1 CAPSULE (100 MG TOTAL) BY MOUTH 2 (TWO) TIMES DAILY. .  diazepam (VALIUM) 10 MG tablet, Take 1 tablet (10 mg total) by mouth at bedtime as needed for anxiety., Disp: 30 tablet, Rfl: 1 .  Fexofenadine HCl (ALLEGRA ALLERGY PO), Take by mouth., Disp: , Rfl:  .  liraglutide (VICTOZA) 18 MG/3ML SOPN, Inject 0.2 mLs (1.2 mg total) into the skin daily., Disp: 6 pen, Rfl: 3 .  lisdexamfetamine (VYVANSE) 20 MG capsule, Take 1 capsule (20 mg total) by mouth daily. Do not fill before 05/05/16 .  lisinopril (PRINIVIL,ZESTRIL) 5 MG tablet, Take 1 tablet (5 mg total) by mouth daily., Disp: 90 tablet, Rfl: 3 .  MAGNESIUM CITRATE PO, Take 1 tablet by mouth every evening., Disp: , Rfl:  .  metFORMIN (GLUCOPHAGE-XR) 500 MG 24 hr tablet, Take 1 tablet (500 mg total) by mouth 2 (two) times daily., Disp: 60 tablet, Rfl: 3 .  omeprazole (PRILOSEC) 40 MG capsule, TAKE 1 CAPSULE BY MOUTH DAILY., Disp: 30 capsule, Rfl: 5 .  pregabalin (LYRICA) 200 MG capsule, Take 1 capsule (200 mg total) by mouth 2 (two) times daily., Disp: 60 capsule, Rfl: 3 .  rosuvastatin (CRESTOR) 10 MG tablet, Take 1 tablet (10 mg total) by mouth daily., Disp: 30 tablet, Rfl: 3 .  testosterone cypionate (DEPOTESTOSTERONE CYPIONATE) 200 MG/ML injection, Inject 1 mL (200 mg total) into the muscle every 14 (fourteen) days., Disp: 10 mL, Rfl: 3  No Known Allergies   Review of Systems   Pertinent items are noted in the HPI. Otherwise, ROS is negative.  Vitals:   Vitals:   06/28/17 0728  BP: 114/76  Pulse: 90  Temp: (!) 97.5 F  (36.4 C)  TempSrc: Oral  SpO2: 97%  Weight: 294 lb 9.6 oz (133.6 kg)  Height: 6' (1.829 m)     Body mass index is 39.95 kg/m.   Physical Exam:   Physical Exam  Constitutional: He is oriented to person, place, and time. He appears well-developed and well-nourished. No distress.  HENT:  Head: Normocephalic and atraumatic.  Right Ear: External ear normal.  Left Ear: External ear normal.  Nose: Nose normal.  Mouth/Throat: Oropharynx is clear and moist.  Eyes: Pupils are equal, round, and reactive to light. Conjunctivae and EOM are normal.  Neck: Normal range of motion. Neck supple.  Cardiovascular: Normal rate, regular rhythm, normal heart sounds and intact distal pulses.  Pulmonary/Chest: Effort normal and breath sounds normal.  Abdominal: Soft. Bowel sounds are normal.  Musculoskeletal:       Right knee: Tenderness found. Medial joint line and lateral joint line tenderness noted.       Left knee: Tenderness found. Medial joint line and lateral joint line tenderness noted.  Neurological: He is alert and oriented to person, place, and time.  Skin: Skin is warm and dry.  Psychiatric: He has a normal mood and affect. His behavior is normal. Judgment and thought content normal.  Nursing note and vitals reviewed.   Results for orders placed or performed in visit on 05/18/17  CBC with Differential/Platelet  Result Value Ref Range   WBC 9.2 4.0 - 10.5 K/uL   RBC 5.92 (H) 4.22 - 5.81 Mil/uL   Hemoglobin 16.8 13.0 - 17.0 g/dL   HCT 51.0 39.0 - 52.0 %   MCV 86.2 78.0 - 100.0 fl   MCHC 33.0 30.0 - 36.0 g/dL   RDW 14.3 11.5 - 15.5 %   Platelets 188.0 150.0 - 400.0 K/uL   Neutrophils Relative % 59.4 43.0 - 77.0 %   Lymphocytes Relative 28.8 12.0 - 46.0 %   Monocytes Relative 9.4 3.0 - 12.0 %   Eosinophils Relative 1.7 0.0 - 5.0 %   Basophils Relative 0.7 0.0 - 3.0 %   Neutro Abs 5.5 1.4 - 7.7 K/uL   Lymphs Abs 2.7 0.7 - 4.0 K/uL   Monocytes Absolute 0.9 0.1 - 1.0 K/uL    Eosinophils Absolute 0.2 0.0 - 0.7 K/uL   Basophils Absolute 0.1 0.0 - 0.1 K/uL  Comprehensive metabolic panel  Result Value Ref Range   Sodium 137 135 - 145 mEq/L   Potassium 4.0 3.5 - 5.1 mEq/L   Chloride 99 96 - 112 mEq/L   CO2 27 19 - 32 mEq/L   Glucose, Bld 83 70 - 99 mg/dL   BUN 11 6 - 23 mg/dL   Creatinine, Ser 0.86 0.40 - 1.50 mg/dL   Total Bilirubin 0.7 0.2 - 1.2 mg/dL   Alkaline Phosphatase 42 39 - 117 U/L   AST 29  0 - 37 U/L   ALT 24 0 - 53 U/L   Total Protein 7.4 6.0 - 8.3 g/dL   Albumin 4.5 3.5 - 5.2 g/dL   Calcium 9.6 8.4 - 10.5 mg/dL   GFR 103.46 >60.00 mL/min  Rheumatoid Factor  Result Value Ref Range   Rhuematoid fact SerPl-aCnc <14 <14 IU/mL   The 10-year ASCVD risk score Mikey Bussing DC Jr., et al., 2013) is: 2.2%   Values used to calculate the score:     Age: 49 years     Sex: Male     Is Non-Hispanic African American: No     Diabetic: Yes     Tobacco smoker: No     Systolic Blood Pressure: 681 mmHg     Is BP treated: Yes     HDL Cholesterol: 38.5 mg/dL     Total Cholesterol: 150 mg/dL  Assessment and Plan:   Diagnoses and all orders for this visit:  Peripheral polyneuropathy Comments: Nearly controlled with current treatment of Lyrica.  We will continue current treatment for now.  Multiple joint pain Comments: Most of the issues that the patient experiences are due to wear and tear.  Continue Celebrex.  Continue weight loss. Orders: -     Ambulatory referral to Orthopedics  Hypogonadism in male Comments: Doing well on testosterone.  Due for a lab recheck today.  He denies any side effects or issues with taking the medication. Orders: -     Testos,Total,Free and SHBG (Male) -     PSA  Morbid obesity ([place]) Comments: 11 pound weight loss since her last visit in early March.  He is working on exercising more regularly.  Binge eating Comments: Vyvanse seems to be a very good medication for this patient.  We will go ahead and refill that for 3  months. Orders: -     lisdexamfetamine (VYVANSE) 20 MG capsule; Take 1 capsule (20 mg total) by mouth daily. Do not fill before 05/05/16 -     lisdexamfetamine (VYVANSE) 20 MG capsule; Take 1 capsule (20 mg total) by mouth daily. Do not fill before 06/02/16  - lisdexamfetamine (VYVANSE) 20 MG capsule; Take 1 capsule (20 mg total) by mouth daily. Do not fill before 05/05/16  . Reviewed expectations re: course of current medical issues. . Discussed self-management of symptoms. . Outlined signs and symptoms indicating need for more acute intervention. . Patient verbalized understanding and all questions were answered. Marland Kitchen Health Maintenance issues including appropriate healthy diet, exercise, and smoking avoidance were discussed with patient. . See orders for this visit as documented in the electronic medical record. . Patient received an After Visit Summary.  Briscoe Deutscher, DO [place] 06/28/2017  No future appointments.

## 2017-07-01 LAB — TESTOS,TOTAL,FREE AND SHBG (FEMALE)
Free Testosterone: 195.3 pg/mL — ABNORMAL HIGH (ref 35.0–155.0)
Sex Hormone Binding: 17 nmol/L (ref 10–50)
Testosterone, Total, LC-MS-MS: 896 ng/dL (ref 250–1100)

## 2017-07-03 ENCOUNTER — Encounter: Payer: Self-pay | Admitting: Family Medicine

## 2017-07-10 ENCOUNTER — Ambulatory Visit (INDEPENDENT_AMBULATORY_CARE_PROVIDER_SITE_OTHER): Payer: BC Managed Care – PPO

## 2017-07-10 ENCOUNTER — Ambulatory Visit (INDEPENDENT_AMBULATORY_CARE_PROVIDER_SITE_OTHER): Payer: BC Managed Care – PPO | Admitting: Orthopedic Surgery

## 2017-07-10 ENCOUNTER — Encounter (INDEPENDENT_AMBULATORY_CARE_PROVIDER_SITE_OTHER): Payer: Self-pay | Admitting: Orthopedic Surgery

## 2017-07-10 VITALS — Ht 72.0 in | Wt 294.0 lb

## 2017-07-10 DIAGNOSIS — G8929 Other chronic pain: Secondary | ICD-10-CM | POA: Diagnosis not present

## 2017-07-10 DIAGNOSIS — M25512 Pain in left shoulder: Secondary | ICD-10-CM

## 2017-07-10 DIAGNOSIS — M545 Low back pain, unspecified: Secondary | ICD-10-CM

## 2017-07-10 DIAGNOSIS — M1712 Unilateral primary osteoarthritis, left knee: Secondary | ICD-10-CM | POA: Diagnosis not present

## 2017-07-10 DIAGNOSIS — M4317 Spondylolisthesis, lumbosacral region: Secondary | ICD-10-CM

## 2017-07-10 DIAGNOSIS — M25562 Pain in left knee: Secondary | ICD-10-CM

## 2017-07-10 MED ORDER — LIDOCAINE HCL 1 % IJ SOLN
5.0000 mL | INTRAMUSCULAR | Status: AC | PRN
  Administered 2017-07-10: 5 mL

## 2017-07-10 MED ORDER — METHYLPREDNISOLONE ACETATE 40 MG/ML IJ SUSP
40.0000 mg | INTRAMUSCULAR | Status: AC | PRN
  Administered 2017-07-10: 40 mg via INTRA_ARTICULAR

## 2017-07-10 NOTE — Progress Notes (Signed)
Office Visit Note   Patient: Brett [name]           Date of Birth: [birth year]           MRN: [id number] Visit Date: 07/10/2017              Requested by: Briscoe Deutscher, [address] [place], [place] [address] PCP: Briscoe Deutscher, DO  Chief Complaint  Patient presents with  . Right Hip - Pain  . Left Hip - Pain  . Right Knee - Pain  . Left Knee - Pain  . Left Shoulder - Pain  . Right Shoulder - Pain      HPI: Patient is a 42 year old gentleman with type 2 diabetes currently on insulin who states that all of his joints hurt for the past 10 years.  Patient states that he used to be very athletic he went 10 years without sports now has resumed athletics with jujitsu.  Patient states that his back hurts top to bottom that his left knee hurts more than the right with crepitation with range of motion in his left shoulder has crepitation and pain with activities.  Patient states that he has lost about 75 pounds and is currently 250 pounds.  He does take Celebrex and Lyrica.  He states the pain is worse in the morning his rheumatoid factor is less than 14.  Assessment & Plan: Visit Diagnoses:  1. Chronic left shoulder pain   2. Chronic pain of left knee   3. Chronic midline low back pain without sciatica   4. Spondylolisthesis, lumbosacral region   5. Unilateral primary osteoarthritis, left knee     Plan: Recommended core strengthening for his back recommended scapular stabilization for the left shoulder recommended muscle strengthening to decrease stress across the joints.  Examination patient has a normal gait.  Follow-Up Instructions: Return if symptoms worsen or fail to improve.   Ortho Exam  Patient is alert, oriented, no adenopathy, well-dressed, normal affect, normal respiratory effort. He has maximal tenderness to palpation the patellofemoral joint left knee.  There is crepitation range of motion greater than the left knee than the right knee collaterals and  cruciates are stable medial lateral joint lines are nontender to palpation bilaterally.  Examination the left shoulder he does have pain with Neer and Hawkins impingement test.  He has no focal motor weakness in the lower extremity there is no radicular symptoms.  Imaging: Xr Knee 1-2 Views Left  Result Date: 07/10/2017 2 view radiographs of the left knee shows medial joint space narrowing subcondylar sclerosis bony spurs in the medial and lateral joint line as well as bony spurs in the patellofemoral joint.  Xr Lumbar Spine 2-3 Views  Result Date: 07/10/2017 2 view radiographs of the lumbar spine shows a grade 1 spondylolisthesis L5-S1 with a very large transverse processes at L5 that abut the sacroiliac joint with sclerotic changes.  Xr Shoulder Left  Result Date: 07/10/2017 3 view radiographs of the left shoulder shows a congruent glenohumeral joint.  There is superior migration of the humeral head within the glenoid.  The lung field is clear.  No images are attached to the encounter.  Labs: Lab Results  Component Value Date   HGBA1C 5.4 03/29/2017   HGBA1C 6.1 12/19/2016   HGBA1C 6.0 09/16/2016    @LABSALLVALUES (HGBA1)@  Body mass index is 39.87 kg/m.  Orders:  Orders Placed This Encounter  Procedures  . XR Knee 1-2 Views Left  . XR Shoulder Left  .  XR Lumbar Spine 2-3 Views   No orders of the defined types were placed in this encounter.    Procedures: Large Joint Inj: L knee on 07/10/2017 9:17 AM Indications: pain and diagnostic evaluation Details: 22 G 1.5 in needle, anteromedial approach  Arthrogram: No  Medications: 5 mL lidocaine 1 %; 40 mg methylPREDNISolone acetate 40 MG/ML Outcome: tolerated well, no immediate complications Procedure, treatment alternatives, risks and benefits explained, specific risks discussed. Consent was given by the patient. Immediately prior to procedure a time out was called to verify the correct patient, procedure, equipment,  support staff and site/side marked as required. Patient was prepped and draped in the usual sterile fashion.   Large Joint Inj: L subacromial bursa on 07/10/2017 9:18 AM Indications: diagnostic evaluation and pain Details: 22 G 1.5 in needle, posterior approach  Arthrogram: No  Medications: 5 mL lidocaine 1 %; 40 mg methylPREDNISolone acetate 40 MG/ML Outcome: tolerated well, no immediate complications Procedure, treatment alternatives, risks and benefits explained, specific risks discussed. Consent was given by the patient. Immediately prior to procedure a time out was called to verify the correct patient, procedure, equipment, support staff and site/side marked as required. Patient was prepped and draped in the usual sterile fashion.      Clinical Data: No additional findings.  ROS:  All other systems negative, except as noted in the HPI. Review of Systems  Objective: Vital Signs: Ht 6' (1.829 m)   Wt 294 lb (133.4 kg)   BMI 39.87 kg/m   Specialty Comments:  No specialty comments available.  PMFS History: Patient Active Problem List   Diagnosis Date Noted  . Unilateral primary osteoarthritis, left knee 07/10/2017  . Spondylolisthesis, lumbosacral region 07/10/2017  . Chronic midline low back pain without sciatica 07/10/2017  . Chronic left shoulder pain 07/10/2017  . Binge eating 06/28/2017  . Multiple joint pain 05/20/2017  . Hypogonadism in male 05/20/2017  . Shoulder impingement 06/16/2016  . Elevated LFTs 06/16/2016  . Hyperlipidemia associated with type 2 diabetes mellitus ([place]) 06/16/2016  . Type 2 diabetes mellitus with diabetic polyneuropathy, without long-term current use of insulin ([place]) 06/16/2016  . Bipolar 2 disorder ([place]) 06/15/2016  . Left rotator cuff tear arthropathy 06/15/2016  . Hypertension associated with diabetes ([place]) 06/15/2016  . Gastroesophageal reflux disease without esophagitis 06/15/2016  . Chronic seasonal allergic rhinitis due to  pollen 06/15/2016  . Peripheral polyneuropathy 06/15/2016  . Morbid obesity ([place]) 06/15/2016  . Insomnia 06/15/2016  . Chronic pain of both knees 06/15/2016  . Asthma    Past Medical History:  Diagnosis Date  . Asthma   . Bipolar 1 disorder ([place]) 06/15/2016   Previously followed by Psychiatry. Hx of "anger issues." Hx of medication use has included Seroquel and Ativan, both of which have helped him in the past.   . Chronic pain of both knees 06/15/2016  . Chronic seasonal allergic rhinitis due to pollen 06/15/2016  . Depression   . Gastroesophageal reflux disease without esophagitis 06/15/2016  . Hypertension   . Insomnia 06/15/2016  . Left rotator cuff tear arthropathy 06/15/2016   s/p repair and PT. No ongoing issues.  . Morbid obesity ([place]) 06/15/2016    History reviewed. No pertinent family history.  Past Surgical History:  Procedure Laterality Date  . BICEPS TENDON REPAIR     Social History   Occupational History  . Occupation: Camera operator: [employer]  Tobacco Use  . Smoking status: Never Smoker  .  Smokeless tobacco: Never Used  Substance and Sexual Activity  . Alcohol use: No  . Drug use: No  . Sexual activity: Yes

## 2017-07-14 ENCOUNTER — Other Ambulatory Visit: Payer: Self-pay | Admitting: Family Medicine

## 2017-07-23 ENCOUNTER — Other Ambulatory Visit: Payer: Self-pay | Admitting: Family Medicine

## 2017-07-23 DIAGNOSIS — F5101 Primary insomnia: Secondary | ICD-10-CM

## 2017-07-24 NOTE — Telephone Encounter (Signed)
Called he does need refill having trouble getting to sleep.

## 2017-07-24 NOTE — Telephone Encounter (Signed)
See if he is taking. I'm not sure that he requested this.

## 2017-07-24 NOTE — Telephone Encounter (Signed)
MEDICATION:   PHARMACY:    IS THIS A 90 DAY SUPPLY : no  IS PATIENT OUT OF MEDICATION:   IF NOT; HOW MUCH IS LEFT:   LAST APPOINTMENT DATE: @4 /19/2019  NEXT APPOINTMENT DATE:@7 /05/2017  OTHER COMMENTS: Ok to refill?    **Let patient know to contact pharmacy at the end of the day to make sure medication is ready. **  ** Please notify patient to allow 48-72 hours to process**  **Encourage patient to contact the pharmacy for refills or they can request refills through [facility]**

## 2017-07-26 ENCOUNTER — Encounter: Payer: Self-pay | Admitting: Family Medicine

## 2017-08-16 ENCOUNTER — Ambulatory Visit: Payer: Self-pay | Admitting: *Deleted

## 2017-08-16 ENCOUNTER — Encounter: Payer: Self-pay | Admitting: Family Medicine

## 2017-08-16 MED ORDER — ONETOUCH VERIO W/DEVICE KIT: 1.0000 | PACK | Freq: Once | 0 refills | Status: DC

## 2017-08-16 NOTE — Telephone Encounter (Signed)
Pt's wife called this morning and left message because her husband had had diarrhea. I called the patient and spoke with him. He had diarrhea for about 24 hours and it has stopped now. So he was feeling fatigued this morning. He denies seeing any blood in his stool.  He denies fever, nausea or vomiting. He is not dehydrated, able to drink lot so fluids and he is doing that. He has been able to eat also, bland food.  He is not able to check his blood sugar this morning because he said the machine needed to be charged, but he do it and send it thru MyChart to Dr. Juleen China.   Advised to call back for any new symptoms or any more diarrhea. Pt voiced understanding. Home care advice given to him with verbal understanding.  Will notify flow at [facility] of triage. Reason for Disposition . Mild weakness or fatigue with acute minor illness (e.g., colds)  Answer Assessment - Initial Assessment Questions 1. DESCRIPTION: "Describe how you are feeling."     fatigue 2. SEVERITY: "How bad is it?"  "Can you stand and walk?"   - MILD - Feels weak or tired, but does not interfere with work, school or normal activities   - [place] to stand and walk; weakness interferes with work, school, or normal activities   - SEVERE - Unable to stand or walk     mid 3. ONSET:  "When did the weakness begin?"     today 4. CAUSE: "What do you think is causing the weakness?"     Having diarrhea a day ago 5. MEDICINES: "Have you recently started a new medicine or had a change in the amount of a medicine?"     no 6. OTHER SYMPTOMS: "Do you have any other symptoms?" (e.g., chest pain, fever, cough, SOB, vomiting, diarrhea, bleeding)     no  Protocols used: WEAKNESS (GENERALIZED) AND FATIGUE-A-AH

## 2017-08-16 NOTE — Telephone Encounter (Signed)
See triage note.

## 2017-08-16 NOTE — Telephone Encounter (Signed)
Called patient he is feeling much better. Last had diarrhea around midnight last night. Has been drinking and eating ok. Would like to hold off on any appointment. Will call tomorrow if not feeling better or go to ed if any changes.

## 2017-09-11 ENCOUNTER — Other Ambulatory Visit: Payer: Self-pay | Admitting: Family Medicine

## 2017-09-11 DIAGNOSIS — E1142 Type 2 diabetes mellitus with diabetic polyneuropathy: Secondary | ICD-10-CM

## 2017-09-11 DIAGNOSIS — E782 Mixed hyperlipidemia: Secondary | ICD-10-CM

## 2017-09-11 NOTE — Telephone Encounter (Signed)
Please advise on refill.

## 2017-09-19 ENCOUNTER — Other Ambulatory Visit: Payer: Self-pay | Admitting: Surgical

## 2017-09-19 ENCOUNTER — Other Ambulatory Visit: Payer: Self-pay | Admitting: Family Medicine

## 2017-09-19 ENCOUNTER — Encounter: Payer: Self-pay | Admitting: Family Medicine

## 2017-09-19 DIAGNOSIS — E782 Mixed hyperlipidemia: Secondary | ICD-10-CM

## 2017-09-19 MED ORDER — LISDEXAMFETAMINE DIMESYLATE 40 MG PO CHEW: 40.0000 mg | CHEWABLE_TABLET | Freq: Every day | ORAL | 0 refills | Status: DC

## 2017-09-20 ENCOUNTER — Telehealth: Payer: Self-pay | Admitting: Surgical

## 2017-09-20 ENCOUNTER — Encounter: Payer: Self-pay | Admitting: Surgical

## 2017-09-20 NOTE — Telephone Encounter (Signed)
Prior Auth started 09/20/17 through cover my meds.   Key: [place]

## 2017-09-20 NOTE — Telephone Encounter (Signed)
PA was approved on 09/20/17 through 09/20/2020.

## 2017-09-25 ENCOUNTER — Other Ambulatory Visit: Payer: Self-pay | Admitting: Family Medicine

## 2017-09-26 NOTE — Progress Notes (Signed)
Brett [name] is a 42 y.o. male is here for follow up.  History of Present Illness:   HPI: Patient is doing very well. See Assessment and Plan section for Problem Based Charting of issues discussed today.   Health Maintenance Due  Topic Date Due  . OPHTHALMOLOGY EXAM  12/30/1984  . FOOT EXAM  09/16/2017  . HEMOGLOBIN A1C  09/26/2017   Depression screen PHQ 2/9 07/29/2016 06/15/2016  Decreased Interest 0 0  Down, Depressed, Hopeless 0 0  PHQ - 2 Score 0 0   PMHx, SurgHx, SocialHx, FamHx, Medications, and Allergies were reviewed in the Visit Navigator and updated as appropriate.   Patient Active Problem List   Diagnosis Date Noted  . Attention deficit hyperactivity disorder (ADHD), combined type 09/27/2017  . Unilateral primary osteoarthritis, left knee 07/10/2017  . Spondylolisthesis, lumbosacral region 07/10/2017  . Chronic midline low back pain without sciatica 07/10/2017  . Chronic left shoulder pain 07/10/2017  . Multiple joint pain 05/20/2017  . Hypogonadism in male 05/20/2017  . Shoulder impingement 06/16/2016  . Hyperlipidemia associated with type 2 diabetes mellitus ([place]) 06/16/2016  . Type 2 diabetes mellitus with diabetic polyneuropathy, without long-term current use of insulin ([place]) 06/16/2016  . Bipolar 2 disorder ([place]) 06/15/2016  . Left rotator cuff tear arthropathy, s/p PT and repair 06/15/2016  . Hypertension associated with diabetes ([place]) 06/15/2016  . Gastroesophageal reflux disease without esophagitis 06/15/2016  . Chronic seasonal allergic rhinitis due to pollen 06/15/2016  . Peripheral polyneuropathy 06/15/2016  . Morbid obesity ([place]) 06/15/2016  . Insomnia 06/15/2016  . Chronic pain of both knees 06/15/2016  . Asthma    Social History   Tobacco Use  . Smoking status: Never Smoker  . Smokeless tobacco: Never Used  Substance Use Topics  . Alcohol use: No  . Drug use: No   Current Medications and Allergies:   Current Outpatient  Medications:  .  albuterol (PROVENTIL HFA;VENTOLIN HFA) 108 (90 Base) MCG/ACT inhaler, TAKE 2 PUFFS BY MOUTH EVERY 6 HOURS AS NEEDED FOR WHEEZE OR SHORTNESS OF BREATH, Disp: 25.5 Inhaler, Rfl: 1 .  amLODipine (NORVASC) 5 MG tablet, Take 1 tablet (5 mg total) by mouth daily., Disp: 90 tablet, Rfl: 3 .  B-D 3CC LUER-LOK SYR 23GX1" 23G X 1" 3 ML MISC, USE AS DIRECTED, Disp: 100 each, Rfl: 0 .  buPROPion (WELLBUTRIN XL) 150 MG 24 hr tablet, TAKE 1 TABLET (150 MG TOTAL) DAILY BY MOUTH., Disp: 90 tablet, Rfl: 2 .  Calcium Carb-Cholecalciferol (CALCIUM 1000 + D) 1000-800 MG-UNIT TABS, , Disp: , Rfl:  .  celecoxib (CELEBREX) 100 MG capsule, TAKE 1 CAPSULE (100 MG TOTAL) BY MOUTH 2 (TWO) TIMES DAILY AS NEEDED.OFFICE VISIT BEFORE NEXT REFILL, Disp: 60 capsule, Rfl: 1 .  diazepam (VALIUM) 10 MG tablet, TAKE 1 TABLET (10 MG TOTAL) BY MOUTH AT BEDTIME AS NEEDED FOR ANXIETY., Disp: 30 tablet, Rfl: 1 .  Fexofenadine HCl (ALLEGRA ALLERGY PO), Take by mouth., Disp: , Rfl:  .  Insulin Pen Needle (B-D UF III MINI PEN NEEDLES) 31G X 5 MM MISC, USE TO INJECT VICTOZA DAILY, Disp: 100 each, Rfl: 1 .  liraglutide (VICTOZA) 18 MG/3ML SOPN, Inject 0.2 mLs (1.2 mg total) into the skin daily., Disp: 6 pen, Rfl: 3 .  lisinopril (PRINIVIL,ZESTRIL) 5 MG tablet, Take 1 tablet (5 mg total) by mouth daily., Disp: 90 tablet, Rfl: 3 .  MAGNESIUM CITRATE PO, Take 1 tablet by mouth every evening., Disp: , Rfl:  .  metFORMIN (  GLUCOPHAGE-XR) 500 MG 24 hr tablet, TAKE 1 TABLET BY MOUTH TWICE A DAY, Disp: 60 tablet, Rfl: 2 .  omeprazole (PRILOSEC) 40 MG capsule, TAKE 1 CAPSULE BY MOUTH DAILY., Disp: 30 capsule, Rfl: 3 .  Potassium Gluconate 550 MG TABS, , Disp: , Rfl:  .  pregabalin (LYRICA) 200 MG capsule, Take 1 capsule (200 mg total) by mouth 2 (two) times daily., Disp: 60 capsule, Rfl: 3 .  rosuvastatin (CRESTOR) 10 MG tablet, TAKE 1 TABLET BY MOUTH EVERY DAY, Disp: 90 tablet, Rfl: 1 .  testosterone cypionate (DEPOTESTOSTERONE  CYPIONATE) 200 MG/ML injection, Inject 1 mL (200 mg total) into the muscle every 14 (fourteen) days., Disp: 10 mL, Rfl: 3 .  fluticasone (FLOVENT HFA) 110 MCG/ACT inhaler, Inhale 1 puff into the lungs 2 (two) times daily., Disp: 1 Inhaler, Rfl: 12 .  Lisdexamfetamine Dimesylate (VYVANSE) 60 MG CHEW, Chew 1 tablet by mouth daily., Disp: 30 tablet, Rfl: 0  No Known Allergies Review of Systems   Pertinent items are noted in the HPI. Otherwise, ROS is negative.  Vitals:   Vitals:   09/27/17 0709  BP: 120/72  Pulse: 68  Temp: 98.6 F (37 C)  TempSrc: Oral  SpO2: 97%  Weight: 258 lb 3.2 oz (117.1 kg)  Height: 6' (1.829 m)     Body mass index is 35.02 kg/m.  Physical Exam:   Physical Exam  Constitutional: He is oriented to person, place, and time. He appears well-developed and well-nourished. No distress.  HENT:  Head: Normocephalic and atraumatic.  Right Ear: External ear normal.  Left Ear: External ear normal.  Nose: Nose normal.  Mouth/Throat: Oropharynx is clear and moist.  Eyes: Pupils are equal, round, and reactive to light. Conjunctivae and EOM are normal.  Neck: Normal range of motion. Neck supple.  Cardiovascular: Normal rate, regular rhythm, normal heart sounds and intact distal pulses.  Pulmonary/Chest: Effort normal and breath sounds normal.  Abdominal: Soft. Bowel sounds are normal.  Musculoskeletal: Normal range of motion.  Neurological: He is alert and oriented to person, place, and time.  Skin: Skin is warm and dry.  Psychiatric: He has a normal mood and affect. His behavior is normal. Judgment and thought content normal.  Nursing note and vitals reviewed.  Assessment and Plan:   Brett [name] was seen today for follow-up.  Diagnoses and all orders for this visit:  Type 2 diabetes mellitus with diabetic polyneuropathy, without long-term current use of insulin (HCC) Comments: A1c today is 5.0. DC Metformin. Continue GLP-1 RA.  Orders: -     POCT glycosylated  hemoglobin (Hb A1C)  Hyperlipidemia associated with type 2 diabetes mellitus ([place]) Comments: On Crestor. No myalgias. Due for recheck. Orders: -     Comprehensive metabolic panel -     Lipid panel  Hypogonadism in male Comments: Needs recheck. WIll likely need less testosterone with weight loss.  Orders: -     CBC with Differential/Platelet -     Testos,Total,Free and SHBG (Male) -     PSA  Hypertension associated with diabetes ([place]) Comments: Controlled. Okay to trial off Norvasc. No CP, SOB, HA, edema.  Morbid obesity ([place]) Comments: Down 40 pounds. Exercising regularly. Eating well.   Left rotator cuff tear arthropathy, s/p PT and repair  Attention deficit hyperactivity disorder (ADHD), combined type Comments: Doing well but not lasting as long as he needs. Wearing off around 2 pm. Will adjust medication. Orders: -     Lisdexamfetamine Dimesylate (VYVANSE) 60 MG CHEW; Chew 1 tablet  by mouth daily.  Mild persistent asthma without complication Comments: Using albuterol more often. Will advance to BID corticosteroid inhaler.  Orders: -     fluticasone (FLOVENT HFA) 110 MCG/ACT inhaler; Inhale 1 puff into the lungs 2 (two) times daily.    . Reviewed expectations re: course of current medical issues. . Discussed self-management of symptoms. . Outlined signs and symptoms indicating need for more acute intervention. . Patient verbalized understanding and all questions were answered. Marland Kitchen Health Maintenance issues including appropriate healthy diet, exercise, and smoking avoidance were discussed with patient. . See orders for this visit as documented in the electronic medical record. . Patient received an After Visit Summary.  Briscoe Deutscher, DO [place] [place] 09/27/2017  Future Appointments  Date Time Provider [place]  12/29/2017  7:20 AM Briscoe Deutscher, DO [facility]

## 2017-09-27 ENCOUNTER — Ambulatory Visit: Payer: BC Managed Care – PPO | Admitting: Family Medicine

## 2017-09-27 ENCOUNTER — Encounter: Payer: Self-pay | Admitting: Family Medicine

## 2017-09-27 VITALS — BP 120/72 | HR 68 | Temp 98.6°F | Ht 72.0 in | Wt 258.2 lb

## 2017-09-27 DIAGNOSIS — F902 Attention-deficit hyperactivity disorder, combined type: Secondary | ICD-10-CM | POA: Diagnosis not present

## 2017-09-27 DIAGNOSIS — E291 Testicular hypofunction: Secondary | ICD-10-CM

## 2017-09-27 DIAGNOSIS — E785 Hyperlipidemia, unspecified: Secondary | ICD-10-CM | POA: Diagnosis not present

## 2017-09-27 DIAGNOSIS — M12812 Other specific arthropathies, not elsewhere classified, left shoulder: Secondary | ICD-10-CM

## 2017-09-27 DIAGNOSIS — I10 Essential (primary) hypertension: Secondary | ICD-10-CM | POA: Diagnosis not present

## 2017-09-27 DIAGNOSIS — E1159 Type 2 diabetes mellitus with other circulatory complications: Secondary | ICD-10-CM | POA: Diagnosis not present

## 2017-09-27 DIAGNOSIS — M75102 Unspecified rotator cuff tear or rupture of left shoulder, not specified as traumatic: Secondary | ICD-10-CM | POA: Diagnosis not present

## 2017-09-27 DIAGNOSIS — I152 Hypertension secondary to endocrine disorders: Secondary | ICD-10-CM

## 2017-09-27 DIAGNOSIS — E1142 Type 2 diabetes mellitus with diabetic polyneuropathy: Secondary | ICD-10-CM | POA: Diagnosis not present

## 2017-09-27 DIAGNOSIS — J4530 Mild persistent asthma, uncomplicated: Secondary | ICD-10-CM | POA: Diagnosis not present

## 2017-09-27 DIAGNOSIS — E1169 Type 2 diabetes mellitus with other specified complication: Secondary | ICD-10-CM

## 2017-09-27 LAB — CBC WITH DIFFERENTIAL/PLATELET
Basophils Absolute: 0.1 10*3/uL (ref 0.0–0.1)
Basophils Relative: 0.9 % (ref 0.0–3.0)
Eosinophils Absolute: 0.1 10*3/uL (ref 0.0–0.7)
Eosinophils Relative: 1.8 % (ref 0.0–5.0)
HCT: 53 % — ABNORMAL HIGH (ref 39.0–52.0)
Hemoglobin: 17.8 g/dL — ABNORMAL HIGH (ref 13.0–17.0)
Lymphocytes Relative: 34.6 % (ref 12.0–46.0)
Lymphs Abs: 1.9 10*3/uL (ref 0.7–4.0)
MCHC: 33.6 g/dL (ref 30.0–36.0)
MCV: 85.7 fl (ref 78.0–100.0)
Monocytes Absolute: 0.7 10*3/uL (ref 0.1–1.0)
Monocytes Relative: 12.1 % — ABNORMAL HIGH (ref 3.0–12.0)
Neutro Abs: 2.8 10*3/uL (ref 1.4–7.7)
Neutrophils Relative %: 50.6 % (ref 43.0–77.0)
Platelets: 124 10*3/uL — ABNORMAL LOW (ref 150.0–400.0)
RBC: 6.18 Mil/uL — ABNORMAL HIGH (ref 4.22–5.81)
RDW: 16.3 % — ABNORMAL HIGH (ref 11.5–15.5)
WBC: 5.5 10*3/uL (ref 4.0–10.5)

## 2017-09-27 LAB — LIPID PANEL
Cholesterol: 106 mg/dL (ref 0–200)
HDL: 31.8 mg/dL — ABNORMAL LOW (ref >39.00)
LDL Cholesterol: 54 mg/dL (ref 0–99)
NonHDL: 74.35
Total CHOL/HDL Ratio: 3
Triglycerides: 104 mg/dL (ref 0.0–149.0)
VLDL: 20.8 mg/dL (ref 0.0–40.0)

## 2017-09-27 LAB — COMPREHENSIVE METABOLIC PANEL
ALT: 28 U/L (ref 0–53)
AST: 26 U/L (ref 0–37)
Albumin: 5.1 g/dL (ref 3.5–5.2)
Alkaline Phosphatase: 54 U/L (ref 39–117)
BUN: 10 mg/dL (ref 6–23)
CO2: 29 mEq/L (ref 19–32)
Calcium: 9.7 mg/dL (ref 8.4–10.5)
Chloride: 97 mEq/L (ref 96–112)
Creatinine, Ser: 0.97 mg/dL (ref 0.40–1.50)
GFR: 89.89 mL/min (ref >60.00)
Glucose, Bld: 86 mg/dL (ref 70–99)
Potassium: 5.1 mEq/L (ref 3.5–5.1)
Sodium: 138 mEq/L (ref 135–145)
Total Bilirubin: 1.4 mg/dL — ABNORMAL HIGH (ref 0.2–1.2)
Total Protein: 7.3 g/dL (ref 6.0–8.3)

## 2017-09-27 LAB — POCT GLYCOSYLATED HEMOGLOBIN (HGB A1C): Hemoglobin A1C: 5 % (ref 4.0–5.6)

## 2017-09-27 LAB — PSA: PSA: 0.8 ng/mL (ref 0.10–4.00)

## 2017-09-27 MED ORDER — LISDEXAMFETAMINE DIMESYLATE 60 MG PO CHEW: 1.0000 | CHEWABLE_TABLET | Freq: Every day | ORAL | 0 refills | Status: DC

## 2017-09-27 MED ORDER — FLUTICASONE PROPIONATE HFA 110 MCG/ACT IN AERO: 1.0000 | INHALATION_SPRAY | Freq: Two times a day (BID) | RESPIRATORY_TRACT | 12 refills | Status: DC

## 2017-09-27 NOTE — Patient Instructions (Addendum)
STOP THE METFORMIN, AMLODIPINE, AND OMEPRAZOLE.  START THE DAILY INHALER.   I WILL CALL IN THE VYVANSE 60 MG.

## 2017-09-30 ENCOUNTER — Encounter: Payer: Self-pay | Admitting: Family Medicine

## 2017-10-01 LAB — TESTOS,TOTAL,FREE AND SHBG (FEMALE)
Free Testosterone: 52.9 pg/mL (ref 35.0–155.0)
Sex Hormone Binding: 27 nmol/L (ref 10–50)
Testosterone, Total, LC-MS-MS: 383 ng/dL (ref 250–1100)

## 2017-10-03 ENCOUNTER — Other Ambulatory Visit: Payer: Self-pay

## 2017-10-03 DIAGNOSIS — R899 Unspecified abnormal finding in specimens from other organs, systems and tissues: Secondary | ICD-10-CM

## 2017-10-07 ENCOUNTER — Other Ambulatory Visit: Payer: Self-pay | Admitting: Family Medicine

## 2017-10-07 DIAGNOSIS — E1142 Type 2 diabetes mellitus with diabetic polyneuropathy: Secondary | ICD-10-CM

## 2017-10-10 ENCOUNTER — Encounter: Payer: Self-pay | Admitting: Family Medicine

## 2017-10-11 ENCOUNTER — Other Ambulatory Visit: Payer: Self-pay | Admitting: Surgical

## 2017-10-11 DIAGNOSIS — Z832 Family history of diseases of the blood and blood-forming organs and certain disorders involving the immune mechanism: Secondary | ICD-10-CM

## 2017-10-11 NOTE — Progress Notes (Signed)
Factor

## 2017-10-16 ENCOUNTER — Telehealth: Payer: Self-pay | Admitting: Family Medicine

## 2017-10-16 NOTE — Telephone Encounter (Signed)
See note

## 2017-10-16 NOTE — Telephone Encounter (Signed)
Copied from [place]. Topic: Quick Communication - See Telephone Encounter >> Oct 16, 2017 12:18 PM Synthia Innocent wrote: CRM for notification. See Telephone encounter for: 10/16/17. Pharmacy states PA is needed on Lisdexamfetamine Dimesylate (VYVANSE) 60 MG CHEW

## 2017-10-16 NOTE — Telephone Encounter (Signed)
Auth placed today:  Jillyn Ledger Key: [id number] Need help? Call us at [phone]  Status  Sent to [employer]  DrugVyvanse 60MG  chewable tablets  [employer] Electronic PA Form (NCPDP)

## 2017-10-17 ENCOUNTER — Ambulatory Visit: Payer: BC Managed Care – PPO | Admitting: Family Medicine

## 2017-10-17 ENCOUNTER — Other Ambulatory Visit: Payer: BC Managed Care – PPO

## 2017-10-17 ENCOUNTER — Ambulatory Visit (INDEPENDENT_AMBULATORY_CARE_PROVIDER_SITE_OTHER): Payer: BC Managed Care – PPO

## 2017-10-17 VITALS — BP 130/86 | HR 110 | Temp 98.4°F | Ht 72.0 in | Wt 247.4 lb

## 2017-10-17 DIAGNOSIS — S93529A Sprain of metatarsophalangeal joint of unspecified toe(s), initial encounter: Secondary | ICD-10-CM | POA: Diagnosis not present

## 2017-10-17 DIAGNOSIS — E291 Testicular hypofunction: Secondary | ICD-10-CM

## 2017-10-17 DIAGNOSIS — M79675 Pain in left toe(s): Secondary | ICD-10-CM

## 2017-10-17 DIAGNOSIS — Z79899 Other long term (current) drug therapy: Secondary | ICD-10-CM

## 2017-10-17 IMAGING — DX DG FOOT COMPLETE 3+V*L*
4 series · 4 of 4 positions shown · non-contrast
Comparison: None.

CLINICAL DATA: Left great toe pain after martial arts injury
several days ago.

EXAM:
LEFT FOOT - COMPLETE 3+ VIEW

[foot dp]
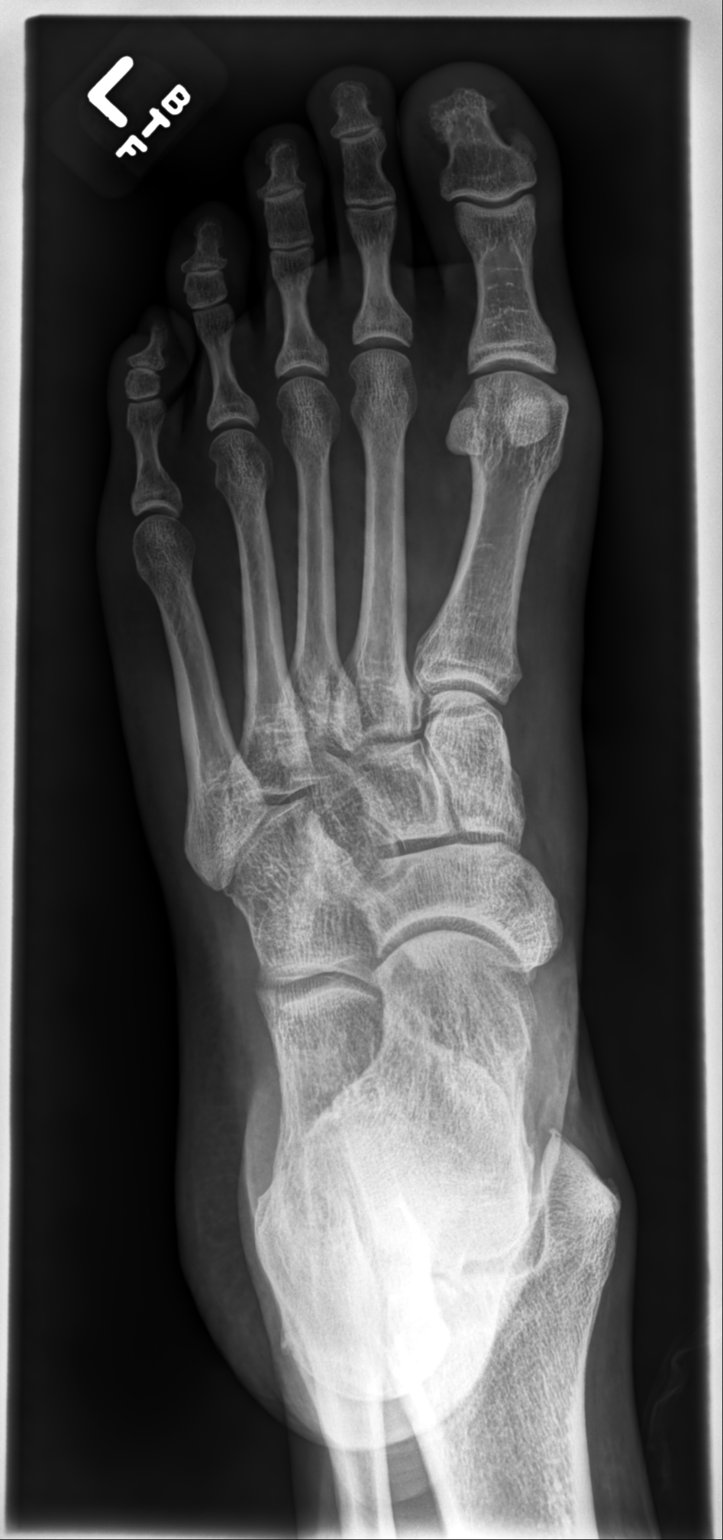

[foot oblique]
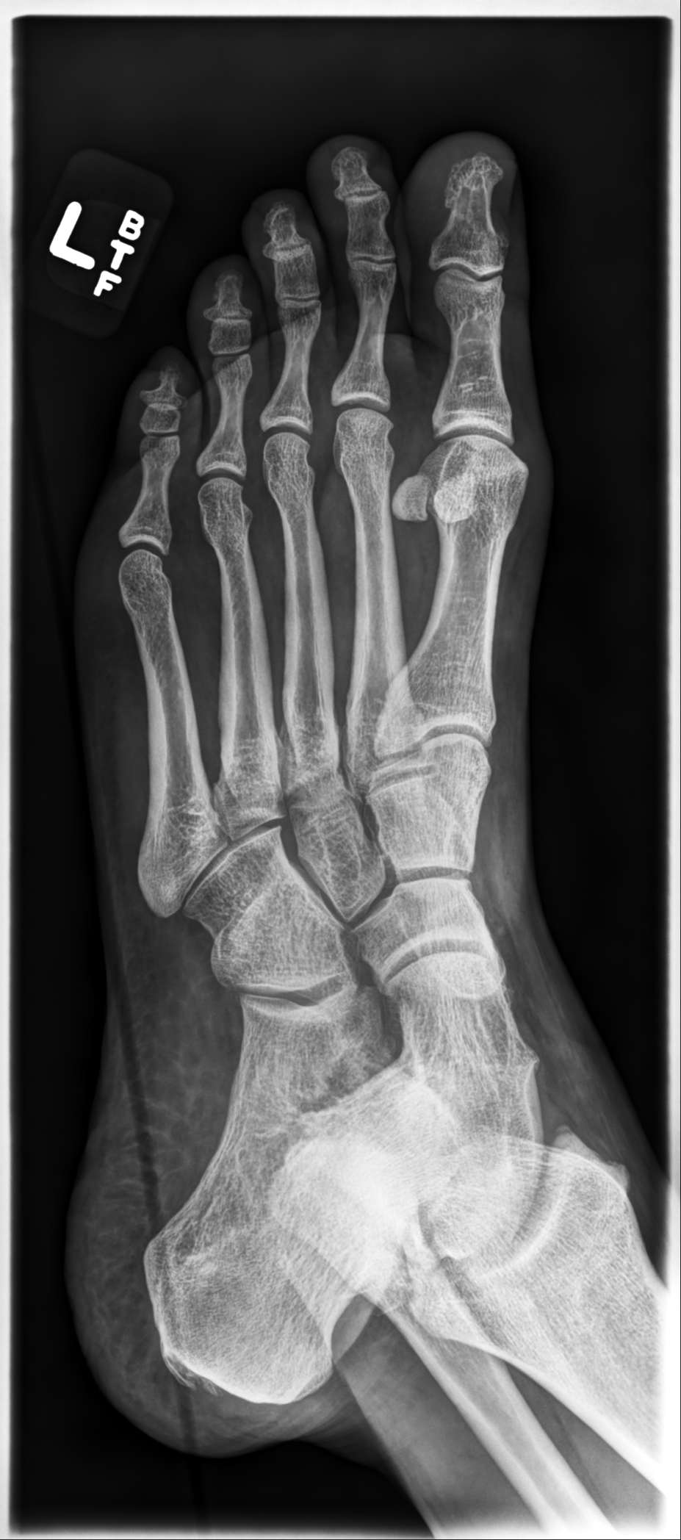

[foot lat (1 of 2)]
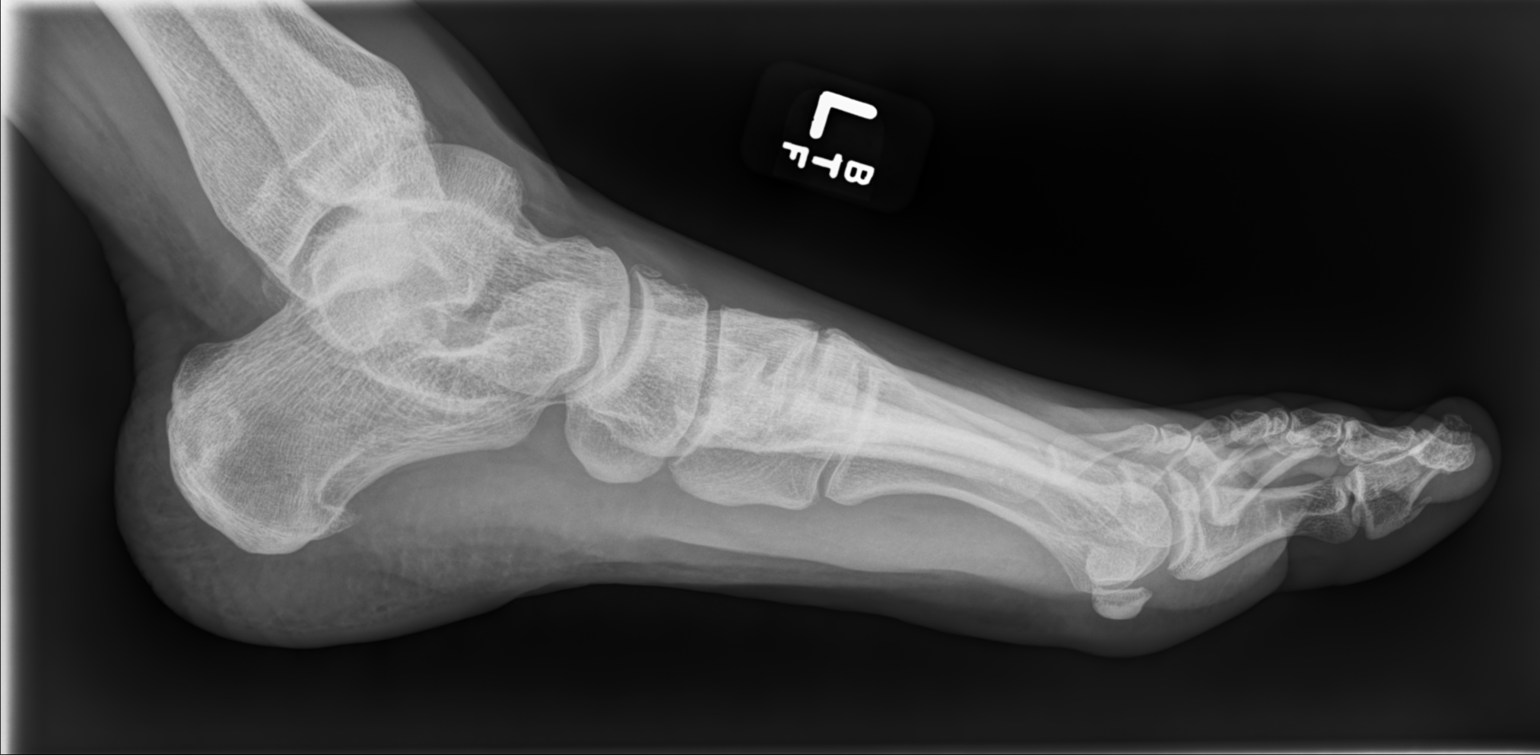

[foot lat (2 of 2)]
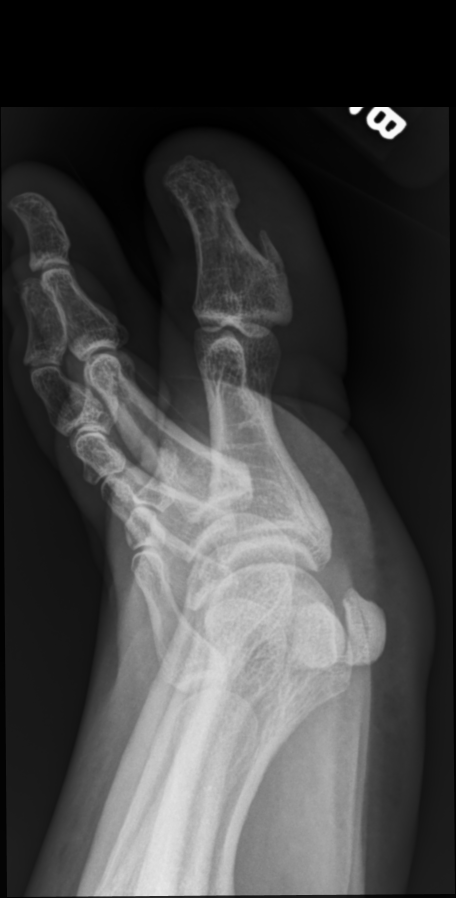

[4 of 4 positions shown; findings below may reference images not displayed]

FINDINGS: There is no evidence of fracture or dislocation. There is no
evidence of arthropathy or other focal bone abnormality. Soft
tissues are unremarkable.
IMPRESSION: No significant abnormality seen in the left foot and left first toe.

## 2017-10-17 NOTE — Progress Notes (Signed)
Brett [name] is a 42 y.o. male here for an acute visit.  History of Present Illness:   HPI: Patient presents with complaint of left great toe pain.  He states that he was in jujitsu class, when a another person landed on his left foot while his right toe was hyperflexed.  He is having pain proximal great toe joint.  It has become swollen and was slightly red.  He has been treating this with anti-inflammatories and ice.  He has continued walking on it, though describes pain when walking for long periods of time.  No prior injury.  PMHx, SurgHx, SocialHx, Medications, and Allergies were reviewed in the Visit Navigator and updated as appropriate.  Current Medications:   Current Outpatient Medications:  .  albuterol (PROVENTIL HFA;VENTOLIN HFA) 108 (90 Base) MCG/ACT inhaler, TAKE 2 PUFFS BY MOUTH EVERY 6 HOURS AS NEEDED FOR WHEEZE OR SHORTNESS OF BREATH, Disp: 25.5 Inhaler, Rfl: 1 .  amLODipine (NORVASC) 5 MG tablet, Take 1 tablet (5 mg total) by mouth daily., Disp: 90 tablet, Rfl: 3 .  B-D 3CC LUER-LOK SYR 23GX1" 23G X 1" 3 ML MISC, USE AS DIRECTED, Disp: 100 each, Rfl: 0 .  buPROPion (WELLBUTRIN XL) 150 MG 24 hr tablet, TAKE 1 TABLET (150 MG TOTAL) DAILY BY MOUTH., Disp: 90 tablet, Rfl: 2 .  Calcium Carb-Cholecalciferol (CALCIUM 1000 + D) 1000-800 MG-UNIT TABS, , Disp: , Rfl:  .  celecoxib (CELEBREX) 100 MG capsule, TAKE 1 CAPSULE (100 MG TOTAL) BY MOUTH 2 (TWO) TIMES DAILY AS NEEDED.OFFICE VISIT BEFORE NEXT REFILL, Disp: 60 capsule, Rfl: 1 .  diazepam (VALIUM) 10 MG tablet, TAKE 1 TABLET (10 MG TOTAL) BY MOUTH AT BEDTIME AS NEEDED FOR ANXIETY., Disp: 30 tablet, Rfl: 1 .  Fexofenadine HCl (ALLEGRA ALLERGY PO), Take by mouth., Disp: , Rfl:  .  fluticasone (FLOVENT HFA) 110 MCG/ACT inhaler, Inhale 1 puff into the lungs 2 (two) times daily., Disp: 1 Inhaler, Rfl: 12 .  Insulin Pen Needle (B-D UF III MINI PEN NEEDLES) 31G X 5 MM MISC, USE TO INJECT VICTOZA DAILY, Disp: 100 each, Rfl: 1 .   liraglutide (VICTOZA) 18 MG/3ML SOPN, Inject 0.2 mLs (1.2 mg total) into the skin daily., Disp: 6 pen, Rfl: 3 .  Lisdexamfetamine Dimesylate (VYVANSE) 60 MG CHEW, Chew 1 tablet by mouth daily., Disp: 30 tablet, Rfl: 0 .  lisinopril (PRINIVIL,ZESTRIL) 5 MG tablet, Take 1 tablet (5 mg total) by mouth daily., Disp: 90 tablet, Rfl: 3 .  MAGNESIUM CITRATE PO, Take 1 tablet by mouth every evening., Disp: , Rfl:  .  metFORMIN (GLUCOPHAGE-XR) 500 MG 24 hr tablet, TAKE 1 TABLET BY MOUTH TWICE A DAY, Disp: 60 tablet, Rfl: 2 .  omeprazole (PRILOSEC) 40 MG capsule, TAKE 1 CAPSULE BY MOUTH DAILY., Disp: 30 capsule, Rfl: 3 .  Potassium Gluconate 550 MG TABS, , Disp: , Rfl:  .  pregabalin (LYRICA) 200 MG capsule, Take 1 capsule (200 mg total) by mouth 2 (two) times daily., Disp: 60 capsule, Rfl: 3 .  rosuvastatin (CRESTOR) 10 MG tablet, TAKE 1 TABLET BY MOUTH EVERY DAY, Disp: 90 tablet, Rfl: 1 .  testosterone cypionate (DEPOTESTOSTERONE CYPIONATE) 200 MG/ML injection, Inject 1 mL (200 mg total) into the muscle every 14 (fourteen) days., Disp: 10 mL, Rfl: 3   No Known Allergies   Review of Systems:   Pertinent items are noted in the HPI. Otherwise, ROS is negative.  Vitals:   Vitals:   10/17/17 1541  BP: 130/86  Pulse: (!) 110  Temp: 98.4 F (36.9 C)  TempSrc: Oral  SpO2: 97%  Weight: 247 lb 6.4 oz (112.2 kg)  Height: 6' (1.829 m)     Body mass index is 33.55 kg/m.  Physical Exam:   Physical Exam  Constitutional: He is oriented to person, place, and time. He appears well-developed and well-nourished. No distress.  HENT:  Head: Normocephalic and atraumatic.  Right Ear: External ear normal.  Left Ear: External ear normal.  Nose: Nose normal.  Mouth/Throat: Oropharynx is clear and moist.  Eyes: Pupils are equal, round, and reactive to light. Conjunctivae and EOM are normal.  Neck: Normal range of motion. Neck supple.  Cardiovascular: Normal rate, regular rhythm, normal heart sounds and  intact distal pulses.  Pulmonary/Chest: Effort normal and breath sounds normal.  Abdominal: Soft. Bowel sounds are normal.  Musculoskeletal: Normal range of motion.       Feet:  Neurological: He is alert and oriented to person, place, and time.  Skin: Skin is warm and dry.  Psychiatric: He has a normal mood and affect. His behavior is normal. Judgment and thought content normal.  Nursing note and vitals reviewed.  Assessment and Plan:   Brett [name] was seen today for foot pain.  Diagnoses and all orders for this visit:  Great toe pain, left -     DG Foot Complete Left  Hypogonadism in male -     CBC with Differential/Platelet -     Comprehensive metabolic panel  Medication management -     CBC with Differential/Platelet -     Comprehensive metabolic panel  Turf toe, initial encounter Comments: Discussed diagnosis and preacuations. Follow up with [facility] if not improving.     . Reviewed expectations re: course of current medical issues. . Discussed self-management of symptoms. . Outlined signs and symptoms indicating need for more acute intervention. . Patient verbalized understanding and all questions were answered. Marland Kitchen Health Maintenance issues including appropriate healthy diet, exercise, and smoking avoidance were discussed with patient. . See orders for this visit as documented in the electronic medical record. . Patient received an After Visit Summary.   Briscoe Deutscher, DO [place] 10/18/2017

## 2017-10-18 ENCOUNTER — Encounter: Payer: Self-pay | Admitting: Surgical

## 2017-10-18 ENCOUNTER — Encounter: Payer: Self-pay | Admitting: Family Medicine

## 2017-10-18 LAB — CBC WITH DIFFERENTIAL/PLATELET
Basophils Absolute: 0 10*3/uL (ref 0.0–0.1)
Basophils Relative: 0.5 % (ref 0.0–3.0)
Eosinophils Absolute: 0.1 10*3/uL (ref 0.0–0.7)
Eosinophils Relative: 1.3 % (ref 0.0–5.0)
HCT: 53.5 % — ABNORMAL HIGH (ref 39.0–52.0)
Hemoglobin: 17.8 g/dL — ABNORMAL HIGH (ref 13.0–17.0)
Lymphocytes Relative: 29.9 % (ref 12.0–46.0)
Lymphs Abs: 1.8 10*3/uL (ref 0.7–4.0)
MCHC: 33.3 g/dL (ref 30.0–36.0)
MCV: 87.3 fl (ref 78.0–100.0)
Monocytes Absolute: 0.6 10*3/uL (ref 0.1–1.0)
Monocytes Relative: 10.2 % (ref 3.0–12.0)
Neutro Abs: 3.5 10*3/uL (ref 1.4–7.7)
Neutrophils Relative %: 58.1 % (ref 43.0–77.0)
Platelets: 144 10*3/uL — ABNORMAL LOW (ref 150.0–400.0)
RBC: 6.12 Mil/uL — ABNORMAL HIGH (ref 4.22–5.81)
RDW: 16 % — ABNORMAL HIGH (ref 11.5–15.5)
WBC: 6 10*3/uL (ref 4.0–10.5)

## 2017-10-18 LAB — COMPREHENSIVE METABOLIC PANEL
ALT: 20 U/L (ref 0–53)
AST: 14 U/L (ref 0–37)
Albumin: 4.9 g/dL (ref 3.5–5.2)
Alkaline Phosphatase: 59 U/L (ref 39–117)
BUN: 13 mg/dL (ref 6–23)
CO2: 28 mEq/L (ref 19–32)
Calcium: 9.9 mg/dL (ref 8.4–10.5)
Chloride: 99 mEq/L (ref 96–112)
Creatinine, Ser: 1.09 mg/dL (ref 0.40–1.50)
GFR: 78.55 mL/min (ref >60.00)
Glucose, Bld: 83 mg/dL (ref 70–99)
Potassium: 4.7 mEq/L (ref 3.5–5.1)
Sodium: 138 mEq/L (ref 135–145)
Total Bilirubin: 1.2 mg/dL (ref 0.2–1.2)
Total Protein: 7.5 g/dL (ref 6.0–8.3)

## 2017-10-19 ENCOUNTER — Other Ambulatory Visit: Payer: Self-pay | Admitting: Family Medicine

## 2017-10-19 ENCOUNTER — Other Ambulatory Visit: Payer: Self-pay

## 2017-10-19 DIAGNOSIS — Z832 Family history of diseases of the blood and blood-forming organs and certain disorders involving the immune mechanism: Secondary | ICD-10-CM

## 2017-10-24 ENCOUNTER — Encounter: Payer: Self-pay | Admitting: Family Medicine

## 2017-10-27 ENCOUNTER — Telehealth: Payer: Self-pay | Admitting: Family Medicine

## 2017-10-27 NOTE — Telephone Encounter (Signed)
Called insurance [place] number [id number] approved until 11/05/18 I have called pharmacy and let them know as well as patient.

## 2017-10-27 NOTE — Telephone Encounter (Signed)
Called patient let him know I will call pharmacy and let them know auth information.  Auth number and information entered in [place] message.

## 2017-10-27 NOTE — Telephone Encounter (Signed)
Patient would like someone to contact him ASAP. He wants to know what's going on with the prior authorization for his Vyvanse. Per patient he has been waiting since the 22nd.

## 2017-11-07 ENCOUNTER — Other Ambulatory Visit: Payer: Self-pay | Admitting: Family Medicine

## 2017-11-08 LAB — HM DIABETES EYE EXAM

## 2017-11-16 ENCOUNTER — Encounter: Payer: Self-pay | Admitting: Family Medicine

## 2017-11-25 ENCOUNTER — Encounter: Payer: Self-pay | Admitting: Family Medicine

## 2017-11-28 ENCOUNTER — Other Ambulatory Visit: Payer: Self-pay | Admitting: Physician Assistant

## 2017-11-28 ENCOUNTER — Encounter: Payer: Self-pay | Admitting: Family Medicine

## 2017-11-28 DIAGNOSIS — F902 Attention-deficit hyperactivity disorder, combined type: Secondary | ICD-10-CM

## 2017-11-28 MED ORDER — PREGABALIN 200 MG PO CAPS: 200.0000 mg | ORAL_CAPSULE | Freq: Two times a day (BID) | ORAL | 0 refills | Status: DC

## 2017-11-28 MED ORDER — LISDEXAMFETAMINE DIMESYLATE 60 MG PO CHEW: 1.0000 | CHEWABLE_TABLET | Freq: Every day | ORAL | 0 refills | Status: DC

## 2017-12-04 ENCOUNTER — Other Ambulatory Visit: Payer: Self-pay | Admitting: Family Medicine

## 2017-12-09 ENCOUNTER — Other Ambulatory Visit: Payer: Self-pay | Admitting: Family Medicine

## 2017-12-13 ENCOUNTER — Ambulatory Visit: Payer: BC Managed Care – PPO | Admitting: Family Medicine

## 2017-12-13 ENCOUNTER — Encounter: Payer: Self-pay | Admitting: Surgical

## 2017-12-13 ENCOUNTER — Encounter: Payer: Self-pay | Admitting: Family Medicine

## 2017-12-13 VITALS — BP 128/78 | HR 63 | Temp 98.2°F | Ht 72.0 in | Wt 228.2 lb

## 2017-12-13 DIAGNOSIS — E1142 Type 2 diabetes mellitus with diabetic polyneuropathy: Secondary | ICD-10-CM | POA: Diagnosis not present

## 2017-12-13 DIAGNOSIS — Z832 Family history of diseases of the blood and blood-forming organs and certain disorders involving the immune mechanism: Secondary | ICD-10-CM

## 2017-12-13 DIAGNOSIS — F3181 Bipolar II disorder: Secondary | ICD-10-CM

## 2017-12-13 DIAGNOSIS — R7989 Other specified abnormal findings of blood chemistry: Secondary | ICD-10-CM

## 2017-12-13 LAB — COMPREHENSIVE METABOLIC PANEL
ALT: 17 U/L (ref 0–53)
AST: 15 U/L (ref 0–37)
Albumin: 4.5 g/dL (ref 3.5–5.2)
Alkaline Phosphatase: 44 U/L (ref 39–117)
BUN: 18 mg/dL (ref 6–23)
CO2: 32 mEq/L (ref 19–32)
Calcium: 9.4 mg/dL (ref 8.4–10.5)
Chloride: 102 mEq/L (ref 96–112)
Creatinine, Ser: 0.98 mg/dL (ref 0.40–1.50)
GFR: 88.74 mL/min (ref >60.00)
Glucose, Bld: 95 mg/dL (ref 70–99)
Potassium: 4.8 mEq/L (ref 3.5–5.1)
Sodium: 139 mEq/L (ref 135–145)
Total Bilirubin: 0.7 mg/dL (ref 0.2–1.2)
Total Protein: 6.8 g/dL (ref 6.0–8.3)

## 2017-12-13 LAB — CBC WITH DIFFERENTIAL/PLATELET
Basophils Absolute: 0 10*3/uL (ref 0.0–0.1)
Basophils Relative: 0.8 % (ref 0.0–3.0)
Eosinophils Absolute: 0.1 10*3/uL (ref 0.0–0.7)
Eosinophils Relative: 1.6 % (ref 0.0–5.0)
HCT: 49.2 % (ref 39.0–52.0)
Hemoglobin: 16.4 g/dL (ref 13.0–17.0)
Lymphocytes Relative: 34.5 % (ref 12.0–46.0)
Lymphs Abs: 1.9 10*3/uL (ref 0.7–4.0)
MCHC: 33.3 g/dL (ref 30.0–36.0)
MCV: 86 fl (ref 78.0–100.0)
Monocytes Absolute: 0.5 10*3/uL (ref 0.1–1.0)
Monocytes Relative: 8.6 % (ref 3.0–12.0)
Neutro Abs: 3.1 10*3/uL (ref 1.4–7.7)
Neutrophils Relative %: 54.5 % (ref 43.0–77.0)
Platelets: 136 10*3/uL — ABNORMAL LOW (ref 150.0–400.0)
RBC: 5.72 Mil/uL (ref 4.22–5.81)
RDW: 14.5 % (ref 11.5–15.5)
WBC: 5.6 10*3/uL (ref 4.0–10.5)

## 2017-12-13 LAB — TESTOSTERONE: Testosterone: 1017.55 ng/dL — ABNORMAL HIGH (ref 300.00–890.00)

## 2017-12-13 NOTE — Progress Notes (Signed)
Brett [name] is a 42 y.o. male is here for follow up.  History of Present Illness:   Brett [name] CMA acting as scribe for Dr. Juleen China.  HPI: Patient comes in today to discuss having plastic surgery. Patient has lost weight and is not happy with the way his body looks with the loose skin. He is most unhappy with the way his skin hangs around his breast area. Cousin is Psychologist, sport and exercise and has offered to do surgery after 6 months of stable weight.  Still doing martial arts. Keeps getting injured but does not want to stop.   Worried that testosterone will need to be decreased again.  Anger up. Has noted some hypomanic symptoms. Previously on Seroquel.  Health Maintenance Due  Topic Date Due  . INFLUENZA VACCINE  10/26/2017   Depression screen [facility] 2/9 12/13/2017 09/27/2017 07/29/2016  Decreased Interest 1 0 0  Down, Depressed, Hopeless 2 0 0  PHQ - 2 Score 3 0 0  Altered sleeping 3 0 -  Tired, decreased energy 3 0 -  Change in appetite 2 0 -  Feeling bad or failure about yourself  2 0 -  Trouble concentrating 2 1 -  Moving slowly or fidgety/restless 2 0 -  Suicidal thoughts 0 0 -  PHQ-9 Score 17 1 -  Difficult doing work/chores Somewhat difficult Not difficult at all -   PMHx, SurgHx, SocialHx, FamHx, Medications, and Allergies were reviewed in the Visit Navigator and updated as appropriate.   Patient Active Problem List   Diagnosis Date Noted  . Attention deficit hyperactivity disorder (ADHD), combined type 09/27/2017  . Unilateral primary osteoarthritis, left knee 07/10/2017  . Spondylolisthesis, lumbosacral region 07/10/2017  . Chronic midline low back pain without sciatica 07/10/2017  . Chronic left shoulder pain 07/10/2017  . Multiple joint pain 05/20/2017  . Hypogonadism in male 05/20/2017  . Shoulder impingement 06/16/2016  . Hyperlipidemia associated with type 2 diabetes mellitus ([place]) 06/16/2016  . Type 2 diabetes mellitus with diabetic polyneuropathy, without  long-term current use of insulin ([place]) 06/16/2016  . Bipolar 2 disorder ([place]) 06/15/2016  . Left rotator cuff tear arthropathy, s/p PT and repair 06/15/2016  . Hypertension associated with diabetes ([place]) 06/15/2016  . Gastroesophageal reflux disease without esophagitis 06/15/2016  . Chronic seasonal allergic rhinitis due to pollen 06/15/2016  . Peripheral polyneuropathy 06/15/2016  . Morbid obesity ([place]) 06/15/2016  . Insomnia 06/15/2016  . Chronic pain of both knees 06/15/2016  . Asthma    Social History   Tobacco Use  . Smoking status: Never Smoker  . Smokeless tobacco: Never Used  Substance Use Topics  . Alcohol use: No  . Drug use: No   Current Medications and Allergies:   .  albuterol (PROVENTIL HFA;VENTOLIN HFA) 108 (90 Base) MCG/ACT inhaler, TAKE 2 PUFFS BY MOUTH EVERY 6 HOURS AS NEEDED FOR WHEEZE OR SHORTNESS OF BREATH, Disp: 25.5 Inhaler, Rfl: 1 .  amLODipine (NORVASC) 5 MG tablet, Take 1 tablet (5 mg total) by mouth daily., Disp: 90 tablet, Rfl: 3 .  B-D 3CC LUER-LOK SYR 23GX1" 23G X 1" 3 ML MISC, USE AS DIRECTED, Disp: 100 each, Rfl: 0 .  buPROPion (WELLBUTRIN XL) 150 MG 24 hr tablet, TAKE 1 TABLET (150 MG TOTAL) DAILY BY MOUTH., Disp: 90 tablet, Rfl: 2 .  Calcium Carb-Cholecalciferol (CALCIUM 1000 + D) 1000-800 MG-UNIT TABS, , Disp: , Rfl:  .  celecoxib (CELEBREX) 100 MG capsule, TAKE 1 CAPSULE (100 MG TOTAL) BY MOUTH 2 (TWO) TIMES DAILY  AS NEEDED.OFFICE VISIT BEFORE NEXT REFILL, Disp: 60 capsule, Rfl: 1 .  diazepam (VALIUM) 10 MG tablet, TAKE 1 TABLET (10 MG TOTAL) BY MOUTH AT BEDTIME AS NEEDED FOR ANXIETY., Disp: 30 tablet, Rfl: 1 .  Fexofenadine HCl (ALLEGRA ALLERGY PO), Take by mouth., Disp: , Rfl:  .  fluticasone (FLOVENT HFA) 110 MCG/ACT inhaler, Inhale 1 puff into the lungs 2 (two) times daily., Disp: 1 Inhaler, Rfl: 12 .  Insulin Pen Needle (B-D UF III MINI PEN NEEDLES) 31G X 5 MM MISC, USE TO INJECT VICTOZA DAILY, Disp: 100 each, Rfl: 1 .  liraglutide (VICTOZA)  18 MG/3ML SOPN, Inject 0.2 mLs (1.2 mg total) into the skin daily., Disp: 6 pen, Rfl: 3 .  Lisdexamfetamine Dimesylate (VYVANSE) 60 MG CHEW, Chew 1 tablet by mouth daily., Disp: 30 tablet, Rfl: 0 .  lisinopril (PRINIVIL,ZESTRIL) 5 MG tablet, Take 1 tablet (5 mg total) by mouth daily., Disp: 90 tablet, Rfl: 3 .  MAGNESIUM CITRATE PO, Take 1 tablet by mouth every evening., Disp: , Rfl:  .  metFORMIN (GLUCOPHAGE-XR) 500 MG 24 hr tablet, TAKE 1 TABLET BY MOUTH TWICE A DAY, Disp: 60 tablet, Rfl: 2 .  omeprazole (PRILOSEC) 40 MG capsule, TAKE 1 CAPSULE BY MOUTH DAILY., Disp: 90 capsule, Rfl: 1 .  Potassium Gluconate 550 MG TABS, , Disp: , Rfl:  .  pregabalin (LYRICA) 200 MG capsule, Take 1 capsule (200 mg total) by mouth 2 (two) times daily., Disp: 60 capsule, Rfl: 0 .  rosuvastatin (CRESTOR) 10 MG tablet, TAKE 1 TABLET BY MOUTH EVERY DAY, Disp: 90 tablet, Rfl: 1 .  testosterone cypionate (DEPOTESTOSTERONE CYPIONATE) 200 MG/ML injection, Inject 1 mL (200 mg total) into the muscle every 14 (fourteen) days., Disp: 10 mL, Rfl: 3  No Known Allergies   Review of Systems   Pertinent items are noted in the HPI. Otherwise, ROS is negative.  Vitals:   Vitals:   12/13/17 0726  BP: 128/78  Pulse: 63  Temp: 98.2 F (36.8 C)  TempSrc: Oral  SpO2: 97%  Weight: 228 lb 3.2 oz (103.5 kg)  Height: 6' (1.829 m)     Body mass index is 30.95 kg/m.  Physical Exam:   Physical Exam  Constitutional: He is oriented to person, place, and time. He appears well-developed and well-nourished. No distress.  HENT:  Head: Normocephalic and atraumatic.  Right Ear: External ear normal.  Left Ear: External ear normal.  Nose: Nose normal.  Mouth/Throat: Oropharynx is clear and moist.  Eyes: Pupils are equal, round, and reactive to light. Conjunctivae and EOM are normal.  Neck: Normal range of motion. Neck supple.  Cardiovascular: Normal rate, regular rhythm, normal heart sounds and intact distal pulses.    Pulmonary/Chest: Effort normal and breath sounds normal.  Abdominal: Soft. Bowel sounds are normal.  Musculoskeletal: Normal range of motion.  Neurological: He is alert and oriented to person, place, and time.  Skin: Skin is warm and dry.  Psychiatric: He has a normal mood and affect. His behavior is normal. Judgment and thought content normal.  Nursing note and vitals reviewed.  Assessment and Plan:   Bryndan was seen today for follow-up.  Diagnoses and all orders for this visit:  Low testosterone in male -     Testosterone  Morbid obesity ([place])  Family history of blood disorder -     Factor 5 leiden  Type 2 diabetes mellitus with diabetic polyneuropathy, without long-term current use of insulin (HCC) -     CBC with  Differential/Platelet -     Comprehensive metabolic panel  Bipolar 2 disorder (HCC) -     QUEtiapine (SEROQUEL) 100 MG tablet; 1-2 po q hs.   . Reviewed expectations re: course of current medical issues. . Discussed self-management of symptoms. . Outlined signs and symptoms indicating need for more acute intervention. . Patient verbalized understanding and all questions were answered. Marland Kitchen Health Maintenance issues including appropriate healthy diet, exercise, and smoking avoidance were discussed with patient. . See orders for this visit as documented in the electronic medical record. . Patient received an After Visit Summary.  CMA served as Education administrator during this visit. History, Physical, and Plan performed by medical provider. The above documentation has been reviewed and is accurate and complete. Briscoe Deutscher, D.O.  Briscoe Deutscher, DO [place], [facility] 12/18/2017

## 2017-12-17 LAB — FACTOR 5 LEIDEN

## 2017-12-18 ENCOUNTER — Encounter: Payer: Self-pay | Admitting: Family Medicine

## 2017-12-18 MED ORDER — QUETIAPINE FUMARATE 100 MG PO TABS: ORAL_TABLET | ORAL | 3 refills | Status: DC

## 2017-12-29 ENCOUNTER — Ambulatory Visit: Payer: BC Managed Care – PPO | Admitting: Family Medicine

## 2017-12-30 ENCOUNTER — Other Ambulatory Visit: Payer: Self-pay | Admitting: Family Medicine

## 2018-01-01 ENCOUNTER — Encounter: Payer: Self-pay | Admitting: Family Medicine

## 2018-01-01 DIAGNOSIS — F902 Attention-deficit hyperactivity disorder, combined type: Secondary | ICD-10-CM

## 2018-01-01 MED ORDER — LISDEXAMFETAMINE DIMESYLATE 60 MG PO CHEW: 1.0000 | CHEWABLE_TABLET | Freq: Every day | ORAL | 0 refills | Status: DC

## 2018-01-03 ENCOUNTER — Other Ambulatory Visit: Payer: Self-pay | Admitting: Family Medicine

## 2018-01-04 MED ORDER — CELECOXIB 100 MG PO CAPS: 100.0000 mg | ORAL_CAPSULE | Freq: Two times a day (BID) | ORAL | 1 refills | Status: DC | PRN

## 2018-01-04 NOTE — Addendum Note (Signed)
Addended by: Marian Sorrow on: 01/04/2018 01:07 PM   Modules accepted: Orders

## 2018-01-09 ENCOUNTER — Other Ambulatory Visit: Payer: Self-pay | Admitting: Family Medicine

## 2018-01-10 ENCOUNTER — Other Ambulatory Visit: Payer: Self-pay | Admitting: Family Medicine

## 2018-01-12 ENCOUNTER — Other Ambulatory Visit: Payer: Self-pay | Admitting: Family Medicine

## 2018-01-12 DIAGNOSIS — F3181 Bipolar II disorder: Secondary | ICD-10-CM

## 2018-01-12 NOTE — Telephone Encounter (Signed)
Please advise on refill.

## 2018-01-14 ENCOUNTER — Encounter: Payer: Self-pay | Admitting: Family Medicine

## 2018-01-15 ENCOUNTER — Other Ambulatory Visit: Payer: Self-pay | Admitting: Surgical

## 2018-01-15 DIAGNOSIS — R7989 Other specified abnormal findings of blood chemistry: Secondary | ICD-10-CM

## 2018-01-15 MED ORDER — CYCLOBENZAPRINE HCL 10 MG PO TABS: 10.0000 mg | ORAL_TABLET | Freq: Two times a day (BID) | ORAL | 0 refills | Status: DC | PRN

## 2018-02-02 ENCOUNTER — Encounter: Payer: Self-pay | Admitting: Family Medicine

## 2018-02-02 DIAGNOSIS — F902 Attention-deficit hyperactivity disorder, combined type: Secondary | ICD-10-CM

## 2018-02-05 MED ORDER — LISDEXAMFETAMINE DIMESYLATE 60 MG PO CHEW: 1.0000 | CHEWABLE_TABLET | Freq: Every day | ORAL | 0 refills | Status: DC

## 2018-02-06 ENCOUNTER — Encounter: Payer: Self-pay | Admitting: Family Medicine

## 2018-02-14 NOTE — Telephone Encounter (Signed)
I contacted the patient personally to discuss further and find a solution to his scheduling. Patient is coming in on Friday and I apologized for the inconvenience as the purpose of the patient engagement center is assist with providing convenience to patients and we are working to bridge that gap as the [facility] is a part of our team. I did mention that what the agent was referring to was the same day appointments not being able to be open at the time of call however, that is not the expectation for the patient to call back and a message should have been sent to see if we could work the patient in. Patient appreciated my call and allowing him to express his concerns and schedule him as well. I advised that I would notify [facility] management as well.

## 2018-02-16 ENCOUNTER — Ambulatory Visit: Payer: BC Managed Care – PPO | Admitting: Family Medicine

## 2018-02-16 ENCOUNTER — Encounter: Payer: Self-pay | Admitting: Family Medicine

## 2018-02-16 VITALS — BP 126/78 | HR 68 | Temp 98.6°F | Ht 72.0 in | Wt 232.2 lb

## 2018-02-16 DIAGNOSIS — Z23 Encounter for immunization: Secondary | ICD-10-CM | POA: Diagnosis not present

## 2018-02-16 DIAGNOSIS — M791 Myalgia, unspecified site: Secondary | ICD-10-CM

## 2018-02-16 DIAGNOSIS — R5383 Other fatigue: Secondary | ICD-10-CM | POA: Diagnosis not present

## 2018-02-16 DIAGNOSIS — R5381 Other malaise: Secondary | ICD-10-CM

## 2018-02-16 DIAGNOSIS — E291 Testicular hypofunction: Secondary | ICD-10-CM | POA: Diagnosis not present

## 2018-02-16 LAB — COMPREHENSIVE METABOLIC PANEL
ALT: 23 U/L (ref 0–53)
AST: 18 U/L (ref 0–37)
Albumin: 4.9 g/dL (ref 3.5–5.2)
Alkaline Phosphatase: 44 U/L (ref 39–117)
BUN: 19 mg/dL (ref 6–23)
CO2: 32 mEq/L (ref 19–32)
Calcium: 9.8 mg/dL (ref 8.4–10.5)
Chloride: 102 mEq/L (ref 96–112)
Creatinine, Ser: 0.92 mg/dL (ref 0.40–1.50)
GFR: 95.38 mL/min (ref >60.00)
Glucose, Bld: 86 mg/dL (ref 70–99)
Potassium: 4.8 mEq/L (ref 3.5–5.1)
Sodium: 141 mEq/L (ref 135–145)
Total Bilirubin: 0.9 mg/dL (ref 0.2–1.2)
Total Protein: 7 g/dL (ref 6.0–8.3)

## 2018-02-16 LAB — CK: Total CK: 45 U/L (ref 7–232)

## 2018-02-16 MED ORDER — BACLOFEN 20 MG PO TABS: 20.0000 mg | ORAL_TABLET | Freq: Every day | ORAL | 2 refills | Status: DC

## 2018-02-16 NOTE — Progress Notes (Signed)
Fergus Throne is a 42 y.o. male is here for follow up.  History of Present Illness:   HPI: Patient presents today with concern for increased fatigue and myalgias. Off testosterone for a few weeks.  Onset: several weeks ago. Symptoms: denies fatigue, weight changes, heat/cold intolerance, bowel/skin changes or CVS symptoms. Usual eating pattern includes: The patient eats a regular, healthy diet.. Usual physical activity includes doing aerobic activity 30 minutes a day 6 days per week. Current life stressors: working overtime. Symptom severity: symptoms bothersome, but easily able to carry out all usual work/school/family activities.   Lab Results  Component Value Date   WBC 5.6 12/13/2017   HGB 16.4 12/13/2017   HCT 49.2 12/13/2017   MCV 86.0 12/13/2017   PLT 136.0 (L) 12/13/2017   Lab Results  Component Value Date   TESTOSTERONE 1,017.55 (H) 12/13/2017    Lab Results  Component Value Date   TSH 2.28 06/15/2016   Lab Results  Component Value Date   VITAMINB12 1,003 (H) 06/15/2016    Body mass index is 31.49 kg/m. BP Readings from Last 1 Encounters:  02/16/18 126/78     There are no preventive care reminders to display for this patient. Depression screen [facility] 2/9 12/13/2017 09/27/2017 07/29/2016  Decreased Interest 1 0 0  Down, Depressed, Hopeless 2 0 0  PHQ - 2 Score 3 0 0  Altered sleeping 3 0 -  Tired, decreased energy 3 0 -  Change in appetite 2 0 -  Feeling bad or failure about yourself  2 0 -  Trouble concentrating 2 1 -  Moving slowly or fidgety/restless 2 0 -  Suicidal thoughts 0 0 -  PHQ-9 Score 17 1 -  Difficult doing work/chores Somewhat difficult Not difficult at all -   PMHx, SurgHx, SocialHx, FamHx, Medications, and Allergies were reviewed in the Visit Navigator and updated as appropriate.   Patient Active Problem List   Diagnosis Date Noted  . Attention deficit hyperactivity disorder (ADHD), combined type 09/27/2017  . Unilateral primary  osteoarthritis, left knee 07/10/2017  . Spondylolisthesis, lumbosacral region 07/10/2017  . Chronic midline low back pain without sciatica 07/10/2017  . Chronic left shoulder pain 07/10/2017  . Multiple joint pain 05/20/2017  . Hypogonadism in male 05/20/2017  . Shoulder impingement 06/16/2016  . Hyperlipidemia associated with type 2 diabetes mellitus ([place]) 06/16/2016  . Type 2 diabetes mellitus with diabetic polyneuropathy, without long-term current use of insulin ([place]) 06/16/2016  . Bipolar 2 disorder ([place]) 06/15/2016  . Left rotator cuff tear arthropathy, s/p PT and repair 06/15/2016  . Hypertension associated with diabetes ([place]) 06/15/2016  . Gastroesophageal reflux disease without esophagitis 06/15/2016  . Chronic seasonal allergic rhinitis due to pollen 06/15/2016  . Peripheral polyneuropathy 06/15/2016  . Morbid obesity ([place]) 06/15/2016  . Insomnia 06/15/2016  . Chronic pain of both knees 06/15/2016  . Asthma    Social History   Tobacco Use  . Smoking status: Never Smoker  . Smokeless tobacco: Never Used  Substance Use Topics  . Alcohol use: No  . Drug use: No   Current Medications and Allergies:   .  albuterol (PROVENTIL HFA;VENTOLIN HFA) 108 (90 Base) MCG/ACT inhaler, TAKE 2 PUFFS BY MOUTH EVERY 6 HOURS AS NEEDED FOR WHEEZE OR SHORTNESS OF BREATH, Disp: 25.5 Inhaler, Rfl: 1 .  buPROPion (WELLBUTRIN XL) 150 MG 24 hr tablet, TAKE 1 TABLET (150 MG TOTAL) BY MOUTH DAILY, Disp: 180 tablet, Rfl: 1 .  Calcium Carb-Cholecalciferol (CALCIUM 1000 + D)  1000-800 MG-UNIT TABS, , Disp: , Rfl:  .  celecoxib (CELEBREX) 100 MG capsule, Take 1 capsule (100 mg total) by mouth 2 (two) times daily as needed., Disp: 180 capsule, Rfl: 1 .  liraglutide (VICTOZA) 18 MG/3ML SOPN, Inject 0.2 mLs (1.2 mg total) into the skin daily., Disp: 6 pen, Rfl: 3 .  Lisdexamfetamine Dimesylate (VYVANSE) 60 MG CHEW, Chew 1 tablet by mouth daily., Disp: 30 tablet, Rfl: 0 .  lisinopril (PRINIVIL,ZESTRIL) 5 MG  tablet, Take 1 tablet (5 mg total) by mouth daily., Disp: 90 tablet, Rfl: 3 .  MAGNESIUM CITRATE PO, Take 1 tablet by mouth every evening., Disp: , Rfl:  .  omeprazole (PRILOSEC) 40 MG capsule, TAKE 1 CAPSULE BY MOUTH DAILY., Disp: 90 capsule, Rfl: 1 .  pregabalin (LYRICA) 200 MG capsule, Take 1 capsule (200 mg total) by mouth 2 (two) times daily., Disp: 60 capsule, Rfl: 0 .  rosuvastatin (CRESTOR) 10 MG tablet, TAKE 1 TABLET BY MOUTH EVERY DAY, Disp: 90 tablet, Rfl: 1  No Known Allergies   Review of Systems   Pertinent items are noted in the HPI. Otherwise, ROS is negative.  Vitals:   Vitals:   02/16/18 0950  BP: 126/78  Pulse: 68  Temp: 98.6 F (37 C)  TempSrc: Oral  SpO2: 97%  Weight: 232 lb 3.2 oz (105.3 kg)  Height: 6' (1.829 m)     Body mass index is 31.49 kg/m.  Physical Exam:   Physical Exam  Constitutional: He is oriented to person, place, and time. He appears well-developed and well-nourished. No distress.  HENT:  Head: Normocephalic and atraumatic.  Right Ear: External ear normal.  Left Ear: External ear normal.  Nose: Nose normal.  Mouth/Throat: Oropharynx is clear and moist.  Eyes: Pupils are equal, round, and reactive to light. Conjunctivae and EOM are normal.  Neck: Normal range of motion. Neck supple.  Cardiovascular: Normal rate, regular rhythm, normal heart sounds and intact distal pulses.  Pulmonary/Chest: Effort normal and breath sounds normal.  Abdominal: Soft. Bowel sounds are normal.  Musculoskeletal: Normal range of motion.  Neurological: He is alert and oriented to person, place, and time.  Skin: Skin is warm and dry.  Psychiatric: He has a normal mood and affect. His behavior is normal. Judgment and thought content normal.  Nursing note and vitals reviewed.  Assessment and Plan:   Kemp was seen today for follow-up.  Diagnoses and all orders for this visit:  Hypogonadism in male -     Testos,Total,Free and SHBG  (Male)  Myalgia Comments: Very active. On statin. Will make sure not contributing.  Orders: -     Comp Met (CMET) -     CK -     baclofen (LIORESAL) 20 MG tablet; Take 1 tablet (20 mg total) by mouth at bedtime.  Need for immunization against influenza -     Flu Vaccine QUAD 36+ mos IM  Malaise and fatigue Comments: Most likely cause of fatigue is testosterone withdrawal. Will recheck labs today. Orders: -     Comp Met (CMET) -     Testos,Total,Free and SHBG (Male)    . Reviewed expectations re: course of current medical issues. . Discussed self-management of symptoms. . Outlined signs and symptoms indicating need for more acute intervention. . Patient verbalized understanding and all questions were answered. Marland Kitchen Health Maintenance issues including appropriate healthy diet, exercise, and smoking avoidance were discussed with patient. . See orders for this visit as documented in the electronic medical record. Marland Kitchen  Patient received an After Visit Summary.Briscoe Deutscher, DO [place], [facility] 02/17/2018

## 2018-02-17 ENCOUNTER — Encounter: Payer: Self-pay | Admitting: Family Medicine

## 2018-02-20 LAB — TESTOS,TOTAL,FREE AND SHBG (FEMALE)
Free Testosterone: 45.4 pg/mL (ref 35.0–155.0)
Sex Hormone Binding: 35 nmol/L (ref 10–50)
Testosterone, Total, LC-MS-MS: 252 ng/dL (ref 250–1100)

## 2018-02-23 ENCOUNTER — Encounter: Payer: Self-pay | Admitting: Family Medicine

## 2018-02-26 ENCOUNTER — Encounter: Payer: Self-pay | Admitting: Family Medicine

## 2018-02-26 ENCOUNTER — Other Ambulatory Visit: Payer: Self-pay | Admitting: Family Medicine

## 2018-02-26 DIAGNOSIS — R7989 Other specified abnormal findings of blood chemistry: Secondary | ICD-10-CM

## 2018-02-26 MED ORDER — TESTOSTERONE CYPIONATE 200 MG/ML IM SOLN: 100.0000 mg | INTRAMUSCULAR | 0 refills | Status: DC

## 2018-02-27 ENCOUNTER — Encounter: Payer: Self-pay | Admitting: Family Medicine

## 2018-03-02 ENCOUNTER — Encounter: Payer: Self-pay | Admitting: Family Medicine

## 2018-03-02 MED ORDER — PREGABALIN 200 MG PO CAPS: 200.0000 mg | ORAL_CAPSULE | Freq: Two times a day (BID) | ORAL | 3 refills | Status: DC

## 2018-03-14 ENCOUNTER — Other Ambulatory Visit: Payer: Self-pay | Admitting: Family Medicine

## 2018-03-16 ENCOUNTER — Ambulatory Visit: Payer: BC Managed Care – PPO | Admitting: Family Medicine

## 2018-03-16 VITALS — BP 124/74 | HR 73 | Temp 97.6°F | Ht 72.0 in | Wt 226.2 lb

## 2018-03-16 DIAGNOSIS — M2550 Pain in unspecified joint: Secondary | ICD-10-CM

## 2018-03-16 DIAGNOSIS — J4530 Mild persistent asthma, uncomplicated: Secondary | ICD-10-CM | POA: Diagnosis not present

## 2018-03-16 DIAGNOSIS — G629 Polyneuropathy, unspecified: Secondary | ICD-10-CM | POA: Diagnosis not present

## 2018-03-16 DIAGNOSIS — E1142 Type 2 diabetes mellitus with diabetic polyneuropathy: Secondary | ICD-10-CM | POA: Diagnosis not present

## 2018-03-16 DIAGNOSIS — I10 Essential (primary) hypertension: Secondary | ICD-10-CM

## 2018-03-16 DIAGNOSIS — F3181 Bipolar II disorder: Secondary | ICD-10-CM

## 2018-03-16 DIAGNOSIS — E291 Testicular hypofunction: Secondary | ICD-10-CM | POA: Diagnosis not present

## 2018-03-16 DIAGNOSIS — E785 Hyperlipidemia, unspecified: Secondary | ICD-10-CM

## 2018-03-16 DIAGNOSIS — E1169 Type 2 diabetes mellitus with other specified complication: Secondary | ICD-10-CM

## 2018-03-16 DIAGNOSIS — K219 Gastro-esophageal reflux disease without esophagitis: Secondary | ICD-10-CM

## 2018-03-16 DIAGNOSIS — E1159 Type 2 diabetes mellitus with other circulatory complications: Secondary | ICD-10-CM

## 2018-03-16 DIAGNOSIS — F902 Attention-deficit hyperactivity disorder, combined type: Secondary | ICD-10-CM

## 2018-03-16 LAB — CBC WITH DIFFERENTIAL/PLATELET
Basophils Absolute: 0 10*3/uL (ref 0.0–0.1)
Basophils Relative: 0.7 % (ref 0.0–3.0)
Eosinophils Absolute: 0.1 10*3/uL (ref 0.0–0.7)
Eosinophils Relative: 1.8 % (ref 0.0–5.0)
HCT: 48.9 % (ref 39.0–52.0)
Hemoglobin: 16.5 g/dL (ref 13.0–17.0)
Lymphocytes Relative: 34.6 % (ref 12.0–46.0)
Lymphs Abs: 1.8 10*3/uL (ref 0.7–4.0)
MCHC: 33.7 g/dL (ref 30.0–36.0)
MCV: 90.5 fl (ref 78.0–100.0)
Monocytes Absolute: 0.4 10*3/uL (ref 0.1–1.0)
Monocytes Relative: 8.3 % (ref 3.0–12.0)
Neutro Abs: 2.9 10*3/uL (ref 1.4–7.7)
Neutrophils Relative %: 54.6 % (ref 43.0–77.0)
Platelets: 133 10*3/uL — ABNORMAL LOW (ref 150.0–400.0)
RBC: 5.4 Mil/uL (ref 4.22–5.81)
RDW: 14.6 % (ref 11.5–15.5)
WBC: 5.3 10*3/uL (ref 4.0–10.5)

## 2018-03-16 LAB — COMPREHENSIVE METABOLIC PANEL
ALT: 28 U/L (ref 0–53)
AST: 22 U/L (ref 0–37)
Albumin: 4.9 g/dL (ref 3.5–5.2)
Alkaline Phosphatase: 44 U/L (ref 39–117)
BUN: 17 mg/dL (ref 6–23)
CO2: 30 mEq/L (ref 19–32)
Calcium: 9.4 mg/dL (ref 8.4–10.5)
Chloride: 103 mEq/L (ref 96–112)
Creatinine, Ser: 0.87 mg/dL (ref 0.40–1.50)
GFR: 101.69 mL/min (ref >60.00)
Glucose, Bld: 91 mg/dL (ref 70–99)
Potassium: 4.4 mEq/L (ref 3.5–5.1)
Sodium: 141 mEq/L (ref 135–145)
Total Bilirubin: 1.1 mg/dL (ref 0.2–1.2)
Total Protein: 7.1 g/dL (ref 6.0–8.3)

## 2018-03-16 LAB — TSH: TSH: 3.44 u[IU]/mL (ref 0.35–4.50)

## 2018-03-16 MED ORDER — ROSUVASTATIN CALCIUM 10 MG PO TABS: 10.0000 mg | ORAL_TABLET | Freq: Every day | ORAL | 3 refills | Status: DC

## 2018-03-16 MED ORDER — ALBUTEROL SULFATE HFA 108 (90 BASE) MCG/ACT IN AERS: INHALATION_SPRAY | RESPIRATORY_TRACT | 1 refills | Status: DC

## 2018-03-16 MED ORDER — VENLAFAXINE HCL ER 37.5 MG PO CP24: 37.5000 mg | ORAL_CAPSULE | Freq: Every day | ORAL | 0 refills | Status: DC

## 2018-03-16 MED ORDER — OMEPRAZOLE 40 MG PO CPDR: 40.0000 mg | DELAYED_RELEASE_CAPSULE | Freq: Every day | ORAL | 1 refills | Status: DC

## 2018-03-16 MED ORDER — LISDEXAMFETAMINE DIMESYLATE 60 MG PO CHEW: 1.0000 | CHEWABLE_TABLET | Freq: Every day | ORAL | 0 refills | Status: DC

## 2018-03-16 MED ORDER — FLUTICASONE PROPIONATE HFA 110 MCG/ACT IN AERO: 1.0000 | INHALATION_SPRAY | Freq: Two times a day (BID) | RESPIRATORY_TRACT | 12 refills | Status: DC

## 2018-03-16 MED ORDER — METHOCARBAMOL 750 MG PO TABS: 750.0000 mg | ORAL_TABLET | Freq: Three times a day (TID) | ORAL | 1 refills | Status: DC

## 2018-03-16 MED ORDER — LISINOPRIL 5 MG PO TABS: 5.0000 mg | ORAL_TABLET | Freq: Every day | ORAL | 3 refills | Status: DC

## 2018-03-16 MED ORDER — PREGABALIN 200 MG PO CAPS: 200.0000 mg | ORAL_CAPSULE | Freq: Two times a day (BID) | ORAL | 3 refills | Status: DC

## 2018-03-16 MED ORDER — LIRAGLUTIDE 18 MG/3ML ~~LOC~~ SOPN: 1.2000 mg | PEN_INJECTOR | Freq: Every day | SUBCUTANEOUS | 3 refills | Status: DC

## 2018-03-16 MED ORDER — CELECOXIB 100 MG PO CAPS: 100.0000 mg | ORAL_CAPSULE | Freq: Two times a day (BID) | ORAL | 1 refills | Status: DC | PRN

## 2018-03-16 MED ORDER — BUPROPION HCL ER (XL) 150 MG PO TB24: ORAL_TABLET | ORAL | 1 refills | Status: DC

## 2018-03-16 NOTE — Progress Notes (Signed)
Brett [name] is a 42 y.o. male is here for follow up.  History of Present Illness:   Brett [name], CMA acting as scribe for Dr. Briscoe Deutscher.   HPI: Patient in office for follow up. Still having joint and muscle pain. He has been taking testosterone monthly but has not noticed a change. He is on Vyvanse and has noticed a big improvement with focus. Not sleeping as much as he knows that he should. Loves the quiet time by himself. Still going to [employer]. Very sore in the morning. Worst at big joints and low back. Mom and sister with similar joint and low back pains, both getting epidural injections. No Hx of joint swelling or redness.   Depression screen [facility] 2/9 03/16/2018 12/13/2017 09/27/2017  Decreased Interest 3 1 0  Down, Depressed, Hopeless 1 2 0  PHQ - 2 Score 4 3 0  Altered sleeping 3 3 0  Tired, decreased energy 3 3 0  Change in appetite 1 2 0  Feeling bad or failure about yourself  0 2 0  Trouble concentrating 0 2 1  Moving slowly or fidgety/restless 0 2 0  Suicidal thoughts 0 0 0  PHQ-9 Score 11 17 1   Difficult doing work/chores Somewhat difficult Somewhat difficult Not difficult at all   PMHx, SurgHx, SocialHx, FamHx, Medications, and Allergies were reviewed in the Visit Navigator and updated as appropriate.   Patient Active Problem List   Diagnosis Date Noted  . Attention deficit hyperactivity disorder (ADHD), combined type 09/27/2017  . Unilateral primary osteoarthritis, left knee 07/10/2017  . Spondylolisthesis, lumbosacral region 07/10/2017  . Chronic midline low back pain without sciatica 07/10/2017  . Chronic left shoulder pain 07/10/2017  . Multiple joint pain 05/20/2017  . Hypogonadism in male 05/20/2017  . Shoulder impingement 06/16/2016  . Hyperlipidemia associated with type 2 diabetes mellitus ([place]) 06/16/2016  . Type 2 diabetes mellitus with diabetic polyneuropathy, without long-term current use of insulin ([place]) 06/16/2016  . Bipolar 2 disorder  ([place]) 06/15/2016  . Left rotator cuff tear arthropathy, s/p PT and repair 06/15/2016  . Hypertension associated with diabetes ([place]) 06/15/2016  . Gastroesophageal reflux disease without esophagitis 06/15/2016  . Chronic seasonal allergic rhinitis due to pollen 06/15/2016  . Peripheral polyneuropathy 06/15/2016  . Morbid obesity ([place]) 06/15/2016  . Insomnia 06/15/2016  . Chronic pain of both knees 06/15/2016  . Asthma    Social History   Tobacco Use  . Smoking status: Never Smoker  . Smokeless tobacco: Never Used  Substance Use Topics  . Alcohol use: No  . Drug use: No   Current Medications and Allergies:   .  albuterol (PROVENTIL HFA;VENTOLIN HFA) 108 (90 Base) MCG/ACT inhaler, TAKE 2 PUFFS BY MOUTH EVERY 6 HOURS AS NEEDED FOR WHEEZE OR SHORTNESS OF BREATH, Disp: 25.5 Inhaler, Rfl: 1 .  B-D 3CC LUER-LOK SYR 23GX1" 23G X 1" 3 ML MISC, USE AS DIRECTED, Disp: 100 each, Rfl: 0 .  baclofen (LIORESAL) 20 MG tablet, Take 1 tablet (20 mg total) by mouth at bedtime., Disp: 30 each, Rfl: 2 .  buPROPion (WELLBUTRIN XL) 150 MG 24 hr tablet, TAKE 1 TABLET (150 MG TOTAL) BY MOUTH DAILY, Disp: 180 tablet, Rfl: 1 .  Calcium Carb-Cholecalciferol (CALCIUM 1000 + D) 1000-800 MG-UNIT TABS, , Disp: , Rfl:  .  celecoxib (CELEBREX) 100 MG capsule, Take 1 capsule (100 mg total) by mouth 2 (two) times daily as needed., Disp: 180 capsule .  fluticasone (FLOVENT HFA) 110 MCG/ACT inhaler,  Inhale 1 puff into the lungs 2 (two) times daily., Disp: 1 Inhaler, Rfl: 12 .  Insulin Pen Needle (B-D UF III MINI PEN NEEDLES) 31G X 5 MM MISC, USE TO INJECT VICTOZA DAILY, Disp: 100 each, Rfl: 1 .  liraglutide (VICTOZA) 18 MG/3ML SOPN, Inject 0.2 mLs (1.2 mg total) into the skin daily., Disp: 6 pen, Rfl: 3 .  Lisdexamfetamine Dimesylate (VYVANSE) 60 MG CHEW, Chew 1 tablet by mouth daily., Disp: 30 tablet, Rfl: 0 .  lisinopril (PRINIVIL,ZESTRIL) 5 MG tablet, Take 1 tablet (5 mg total) by mouth daily., Disp: 90 tablet, Rfl:  3 .  MAGNESIUM CITRATE PO, Take 1 tablet by mouth every evening., Disp: , Rfl:  .  omeprazole (PRILOSEC) 40 MG capsule, TAKE 1 CAPSULE BY MOUTH DAILY., Disp: 90 capsule, Rfl: 1 .  Potassium Gluconate 550 MG TABS, , Disp: , Rfl:  .  pregabalin (LYRICA) 200 MG capsule, Take 1 capsule (200 mg total) by mouth 2 (two) times daily., Disp: 180 capsule, Rfl: 3 .  rosuvastatin (CRESTOR) 10 MG tablet, TAKE 1 TABLET BY MOUTH EVERY DAY, Disp: 90 tablet, Rfl: 1 .  testosterone cypionate (DEPOTESTOSTERONE CYPIONATE) 200 MG/ML injection, Inject 0.5 mLs (100 mg total) into the muscle every 28 (twenty-eight) days., Disp: 10 mL, Rfl: 0  No Known Allergies   Review of Systems   Pertinent items are noted in the HPI. Otherwise, a complete ROS is negative.  Vitals:   Vitals:   03/16/18 0727  BP: 124/74  Pulse: 73  Temp: 97.6 F (36.4 C)  TempSrc: Oral  SpO2: 97%  Weight: 226 lb 3.2 oz (102.6 kg)  Height: 6' (1.829 m)     Body mass index is 30.68 kg/m.  Physical Exam:   Physical Exam Constitutional:      General: He is not in acute distress.    Appearance: He is well-developed. He is not diaphoretic.  HENT:     Head: Normocephalic and atraumatic.     Right Ear: External ear normal.     Left Ear: External ear normal.     Nose: Nose normal.  Eyes:     Conjunctiva/sclera: Conjunctivae normal.  Neck:     Musculoskeletal: Normal range of motion.  Cardiovascular:     Rate and Rhythm: Normal rate and regular rhythm.  Pulmonary:     Effort: Pulmonary effort is normal. No respiratory distress.  Abdominal:     General: Bowel sounds are normal.     Palpations: Abdomen is soft.     Tenderness: There is no abdominal tenderness.  Musculoskeletal: Normal range of motion.        General: No deformity.  Skin:    General: Skin is warm.  Neurological:     Mental Status: He is alert and oriented to person, place, and time.  Psychiatric:        Behavior: Behavior normal.    Results for orders  placed or performed in visit on 03/16/18  TSH  Result Value Ref Range   TSH 3.44 0.35 - 4.50 uIU/mL  Comprehensive metabolic panel  Result Value Ref Range   Sodium 141 135 - 145 mEq/L   Potassium 4.4 3.5 - 5.1 mEq/L   Chloride 103 96 - 112 mEq/L   CO2 30 19 - 32 mEq/L   Glucose, Bld 91 70 - 99 mg/dL   BUN 17 6 - 23 mg/dL   Creatinine, Ser 0.87 0.40 - 1.50 mg/dL   Total Bilirubin 1.1 0.2 - 1.2 mg/dL   Alkaline  Phosphatase 44 39 - 117 U/L   AST 22 0 - 37 U/L   ALT 28 0 - 53 U/L   Total Protein 7.1 6.0 - 8.3 g/dL   Albumin 4.9 3.5 - 5.2 g/dL   Calcium 9.4 8.4 - 10.5 mg/dL   GFR 101.69 >60.00 mL/min  CBC with Differential/Platelet  Result Value Ref Range   WBC 5.3 4.0 - 10.5 K/uL   RBC 5.40 4.22 - 5.81 Mil/uL   Hemoglobin 16.5 13.0 - 17.0 g/dL   HCT 48.9 39.0 - 52.0 %   MCV 90.5 78.0 - 100.0 fl   MCHC 33.7 30.0 - 36.0 g/dL   RDW 14.6 11.5 - 15.5 %   Platelets 133.0 (L) 150.0 - 400.0 K/uL   Neutrophils Relative % 54.6 43.0 - 77.0 %   Lymphocytes Relative 34.6 12.0 - 46.0 %   Monocytes Relative 8.3 3.0 - 12.0 %   Eosinophils Relative 1.8 0.0 - 5.0 %   Basophils Relative 0.7 0.0 - 3.0 %   Neutro Abs 2.9 1.4 - 7.7 K/uL   Lymphs Abs 1.8 0.7 - 4.0 K/uL   Monocytes Absolute 0.4 0.1 - 1.0 K/uL   Eosinophils Absolute 0.1 0.0 - 0.7 K/uL   Basophils Absolute 0.0 0.0 - 0.1 K/uL    Assessment and Plan:   Brett [name] was seen today for follow-up.  Diagnoses and all orders for this visit:  Hypogonadism in male Comments: Will adjust testosterone dose when labs reviewed.  Orders: -     Testos,Total,Free and SHBG (Male)  Bipolar 2 disorder (HCC) -     buPROPion (WELLBUTRIN XL) 150 MG 24 hr tablet; TAKE 1 TABLET (150 MG TOTAL) BY MOUTH DAILY -     venlafaxine XR (EFFEXOR XR) 37.5 MG 24 hr capsule; Take 1 capsule (37.5 mg total) by mouth daily with breakfast.  Mild persistent asthma without complication Comments: Using albuterol more often. Will advance to BID corticosteroid inhaler.   Orders: -     albuterol (PROVENTIL HFA;VENTOLIN HFA) 108 (90 Base) MCG/ACT inhaler; TAKE 2 PUFFS BY MOUTH EVERY 6 HOURS AS NEEDED FOR WHEEZE OR SHORTNESS OF BREATH -     fluticasone (FLOVENT HFA) 110 MCG/ACT inhaler; Inhale 1 puff into the lungs 2 (two) times daily.  Type 2 diabetes mellitus with diabetic polyneuropathy, without long-term current use of insulin (HCC) -     liraglutide (VICTOZA) 18 MG/3ML SOPN; Inject 0.2 mLs (1.2 mg total) into the skin daily. -     lisinopril (PRINIVIL,ZESTRIL) 5 MG tablet; Take 1 tablet (5 mg total) by mouth daily. -     Comprehensive metabolic panel -     CBC with Differential/Platelet  Attention deficit hyperactivity disorder (ADHD), combined type Comments: Doing well but not lasting as long as he needs. Wearing off around 2 pm. Will adjust medication. Orders: -     Lisdexamfetamine Dimesylate (VYVANSE) 60 MG CHEW; Chew 1 tablet by mouth daily. -     Lisdexamfetamine Dimesylate (VYVANSE) 60 MG CHEW; Chew 1 tablet by mouth daily. -     Lisdexamfetamine Dimesylate (VYVANSE) 60 MG CHEW; Chew 1 tablet by mouth daily.  Hypertension associated with diabetes ([place]) -     lisinopril (PRINIVIL,ZESTRIL) 5 MG tablet; Take 1 tablet (5 mg total) by mouth daily.  Hyperlipidemia associated with type 2 diabetes mellitus (HCC) -     rosuvastatin (CRESTOR) 10 MG tablet; Take 1 tablet (10 mg total) by mouth daily.  Multiple joint pain -     celecoxib (CELEBREX) 100  MG capsule; Take 1 capsule (100 mg total) by mouth 2 (two) times daily as needed. -     methocarbamol (ROBAXIN-750) 750 MG tablet; Take 1 tablet (750 mg total) by mouth 3 (three) times daily. -     TSH  Peripheral polyneuropathy -     pregabalin (LYRICA) 200 MG capsule; Take 1 capsule (200 mg total) by mouth 2 (two) times daily. -     TSH  Gastroesophageal reflux disease without esophagitis -     omeprazole (PRILOSEC) 40 MG capsule; Take 1 capsule (40 mg total) by mouth daily.   . Orders and follow  up as documented in [place], reviewed diet, exercise and weight control, cardiovascular risk and specific lipid/LDL goals reviewed, reviewed medications and side effects in detail.  . Reviewed expectations re: course of current medical issues. . Outlined signs and symptoms indicating need for more acute intervention. . Patient verbalized understanding and all questions were answered. . Patient received an After Visit Summary.  CMA served as Education administrator during this visit. History, Physical, and Plan performed by medical provider. The above documentation has been reviewed and is accurate and complete. Briscoe Deutscher, D.O.  Briscoe Deutscher, DO [place], [facility] 03/17/2018

## 2018-03-17 ENCOUNTER — Encounter: Payer: Self-pay | Admitting: Family Medicine

## 2018-03-23 LAB — TESTOS,TOTAL,FREE AND SHBG (FEMALE)
Free Testosterone: 39 pg/mL (ref 35.0–155.0)
Sex Hormone Binding: 36 nmol/L (ref 10–50)
Testosterone, Total, LC-MS-MS: 231 ng/dL — ABNORMAL LOW (ref 250–1100)

## 2018-03-27 ENCOUNTER — Encounter: Payer: Self-pay | Admitting: Family Medicine

## 2018-03-27 MED ORDER — TESTOSTERONE CYPIONATE 200 MG/ML IM SOLN: 100.0000 mg | INTRAMUSCULAR | 2 refills | Status: DC

## 2018-04-15 ENCOUNTER — Other Ambulatory Visit: Payer: Self-pay | Admitting: Family Medicine

## 2018-04-15 DIAGNOSIS — E291 Testicular hypofunction: Secondary | ICD-10-CM

## 2018-04-20 ENCOUNTER — Other Ambulatory Visit: Payer: Self-pay | Admitting: Family Medicine

## 2018-04-20 NOTE — Telephone Encounter (Signed)
Scrip should be every two weeks

## 2018-04-21 ENCOUNTER — Encounter: Payer: Self-pay | Admitting: Family Medicine

## 2018-04-23 MED ORDER — TESTOSTERONE CYPIONATE 200 MG/ML IM SOLN: 100.0000 mg | INTRAMUSCULAR | 3 refills | Status: DC

## 2018-05-10 ENCOUNTER — Encounter: Payer: Self-pay | Admitting: Family Medicine

## 2018-05-16 ENCOUNTER — Other Ambulatory Visit: Payer: Self-pay | Admitting: Family Medicine

## 2018-05-16 MED ORDER — OSELTAMIVIR PHOSPHATE 75 MG PO CAPS: 75.0000 mg | ORAL_CAPSULE | Freq: Every day | ORAL | 0 refills | Status: DC

## 2018-05-21 ENCOUNTER — Ambulatory Visit: Payer: BC Managed Care – PPO | Admitting: Family Medicine

## 2018-06-10 ENCOUNTER — Other Ambulatory Visit: Payer: Self-pay | Admitting: Family Medicine

## 2018-06-10 DIAGNOSIS — F3181 Bipolar II disorder: Secondary | ICD-10-CM

## 2018-06-11 NOTE — Telephone Encounter (Signed)
Last OV 03/16/2018 Last refill 03/16/2018 #90/0 Next OV 06/15/2018

## 2018-06-14 ENCOUNTER — Other Ambulatory Visit: Payer: Self-pay | Admitting: Family Medicine

## 2018-06-14 NOTE — Telephone Encounter (Signed)
Last OV 03/16/2018 Last refill 04/23/2018 #1 mL/3 Next OV 08/08/2018  Forwarding to Dr. Juleen China

## 2018-06-15 ENCOUNTER — Ambulatory Visit: Payer: BC Managed Care – PPO | Admitting: Family Medicine

## 2018-07-30 ENCOUNTER — Encounter: Payer: Self-pay | Admitting: Family Medicine

## 2018-07-30 NOTE — Progress Notes (Signed)
Virtual Visit via Video   Due to the COVID-19 pandemic, this visit was completed with telemedicine (audio/video) technology to reduce patient and provider exposure as well as to preserve personal protective equipment.   I connected with Brett [name] by a video enabled telemedicine application and verified that I am speaking with the correct person using two identifiers. Location patient: Home Location provider: [facility], Office Persons participating in the virtual visit: Brett [name], Brett Deutscher, Brett [name] Brett [name], Brett [name] acting as scribe for Dr. Briscoe [name].   I discussed the limitations of evaluation and management by telemedicine and the availability of in person appointments. The patient expressed understanding and agreed to proceed.  Care Team   Patient Care Team: Brett Deutscher, Brett [name] as PCP - General (Family Medicine) Newt Minion, MD as Consulting Physician (Orthopedic Surgery) Gerda Diss, Brett [name] as Consulting Physician (Family Medicine) Hutto, Algis Greenhouse, OD (Optometry)  Subjective:   HPI: Stye, right lower lid, hard, tender, x a few weeks. No treatment.   Review of Systems  Constitutional: Negative for chills, fever, malaise/fatigue and weight loss.  Respiratory: Negative for cough, shortness of breath and wheezing.   Cardiovascular: Negative for chest pain, palpitations and leg swelling.  Gastrointestinal: Negative for abdominal pain, constipation, diarrhea, nausea and vomiting.  Genitourinary: Negative for dysuria and urgency.  Musculoskeletal: Negative for joint pain and myalgias.  Skin: Negative for rash.  Neurological: Negative for dizziness and headaches.  Psychiatric/Behavioral: Negative for depression, substance abuse and suicidal ideas. The patient is not nervous/anxious.    Patient Active Problem List   Diagnosis Date Noted  . Attention deficit hyperactivity disorder (ADHD), combined type 09/27/2017  . Unilateral primary  osteoarthritis, left knee 07/10/2017  . Spondylolisthesis, lumbosacral region 07/10/2017  . Chronic midline low back pain without sciatica 07/10/2017  . Chronic left shoulder pain 07/10/2017  . Multiple joint pain 05/20/2017  . Hypogonadism in male 05/20/2017  . Shoulder impingement 06/16/2016  . Hyperlipidemia associated with type 2 diabetes mellitus ([place]) 06/16/2016  . Type 2 diabetes mellitus with diabetic polyneuropathy, without long-term current use of insulin ([place]) 06/16/2016  . Bipolar 2 disorder ([place]) 06/15/2016  . Left rotator cuff tear arthropathy, s/p PT and repair 06/15/2016  . Hypertension associated with diabetes ([place]) 06/15/2016  . Gastroesophageal reflux disease without esophagitis 06/15/2016  . Chronic seasonal allergic rhinitis due to pollen 06/15/2016  . Peripheral polyneuropathy 06/15/2016  . Morbid obesity ([place]) 06/15/2016  . Insomnia 06/15/2016  . Chronic pain of both knees 06/15/2016  . Asthma     Social History   Tobacco Use  . Smoking status: Never Smoker  . Smokeless tobacco: Never Used  Substance Use Topics  . Alcohol use: No    Current Outpatient Medications:  .  albuterol (PROVENTIL HFA;VENTOLIN HFA) 108 (90 Base) MCG/ACT inhaler, TAKE 2 PUFFS BY MOUTH EVERY 6 HOURS AS NEEDED FOR WHEEZE OR SHORTNESS OF BREATH, Disp: 25.5 Inhaler, Rfl: 1 .  B-D 3CC LUER-LOK SYR 22GX1-1/2 22G X 1-1/2" 3 ML MISC, USE ONCE A WEEK AS DIRECTED, Disp: 4 each, Rfl: 12 .  baclofen (LIORESAL) 20 MG tablet, Take 1 tablet (20 mg total) by mouth at bedtime., Disp: 30 each, Rfl: 2 .  buPROPion (WELLBUTRIN XL) 150 MG 24 hr tablet, TAKE 1 TABLET (150 MG TOTAL) BY MOUTH DAILY, Disp: 180 tablet, Rfl: 1 .  Calcium Carb-Cholecalciferol (CALCIUM 1000 + D) 1000-800 MG-UNIT TABS, , Disp: , Rfl:  .  celecoxib (CELEBREX) 100 MG capsule, Take 1 capsule (  100 mg total) by mouth 2 (two) times daily as needed., Disp: 180 capsule, Rfl: 1 .  erythromycin ophthalmic ointment, Place 1 application into  the right eye at bedtime., Disp: 3.5 g, Rfl: 0 .  fluticasone (FLOVENT HFA) 110 MCG/ACT inhaler, Inhale 1 puff into the lungs 2 (two) times daily., Disp: 1 Inhaler, Rfl: 12 .  liraglutide (VICTOZA) 18 MG/3ML SOPN, Inject 0.2 mLs (1.2 mg total) into the skin daily., Disp: 6 pen, Rfl: 3 .  Lisdexamfetamine Dimesylate (VYVANSE) 60 MG CHEW, Chew 1 tablet by mouth daily., Disp: 30 tablet, Rfl: 0 .  Lisdexamfetamine Dimesylate (VYVANSE) 60 MG CHEW, Chew 1 tablet by mouth daily., Disp: 30 tablet, Rfl: 0 .  Lisdexamfetamine Dimesylate (VYVANSE) 60 MG CHEW, Chew 1 tablet by mouth daily., Disp: 30 tablet, Rfl: 0 .  Lisdexamfetamine Dimesylate (VYVANSE) 60 MG CHEW, Chew 1 tablet by mouth daily., Disp: 30 tablet, Rfl: 0 .  lisinopril (PRINIVIL,ZESTRIL) 5 MG tablet, Take 1 tablet (5 mg total) by mouth daily., Disp: 90 tablet, Rfl: 3 .  MAGNESIUM CITRATE PO, Take 1 tablet by mouth every evening., Disp: , Rfl:  .  methocarbamol (ROBAXIN-750) 750 MG tablet, Take 1 tablet (750 mg total) by mouth 3 (three) times daily., Disp: 270 tablet, Rfl: 1 .  omeprazole (PRILOSEC) 40 MG capsule, Take 1 capsule (40 mg total) by mouth daily., Disp: 90 capsule, Rfl: 1 .  oseltamivir (TAMIFLU) 75 MG capsule, Take 1 capsule (75 mg total) by mouth daily., Disp: 5 capsule, Rfl: 0 .  Potassium Gluconate 550 MG TABS, , Disp: , Rfl:  .  pregabalin (LYRICA) 200 MG capsule, Take 1 capsule (200 mg total) by mouth 2 (two) times daily., Disp: 180 capsule, Rfl: 3 .  rosuvastatin (CRESTOR) 10 MG tablet, Take 1 tablet (10 mg total) by mouth daily., Disp: 90 tablet, Rfl: 3 .  testosterone cypionate (DEPOTESTOSTERONE CYPIONATE) 200 MG/ML injection, INJECT 0.5 MLS (100 MG TOTAL) INTO THE MUSCLE EVERY 28 (TWENTY-EIGHT) DAYS., Disp: 1 mL, Rfl: 5 .  venlafaxine XR (EFFEXOR-XR) 37.5 MG 24 hr capsule, TAKE 1 CAPSULE (37.5 MG TOTAL) BY MOUTH DAILY WITH BREAKFAST., Disp: 90 capsule, Rfl: 1  No Known Allergies  Objective:   VITALS: Per patient if  applicable, see vitals. GENERAL: Alert, appears well and in no acute distress. HEENT: Atraumatic, conjunctiva clear, no obvious abnormalities on inspection of external nose and ears. NECK: Normal movements of the head and neck. CARDIOPULMONARY: No increased WOB. Speaking in clear sentences. I:E ratio WNL.  MS: Moves all visible extremities without noticeable abnormality. PSYCH: Pleasant and cooperative, well-groomed. Speech normal rate and rhythm. Affect is appropriate. Insight and judgement are appropriate. Attention is focused, linear, and appropriate.  NEURO: CN grossly intact. Oriented as arrived to appointment on time with no prompting. Moves both UE equally.  SKIN: No obvious lesions, wounds, erythema, or cyanosis noted on face or hands.  Depression screen [facility] 2/9 03/16/2018 12/13/2017 09/27/2017  Decreased Interest 3 1 0  Down, Depressed, Hopeless 1 2 0  PHQ - 2 Score 4 3 0  Altered sleeping 3 3 0  Tired, decreased energy 3 3 0  Change in appetite 1 2 0  Feeling bad or failure about yourself  0 2 0  Trouble concentrating 0 2 1  Moving slowly or fidgety/restless 0 2 0  Suicidal thoughts 0 0 0  PHQ-9 Score 11 17 1   Difficult doing work/chores Somewhat difficult Somewhat difficult Not difficult at all    Assessment and Plan:  Juanmanuel was seen today for eye drainage.  Diagnoses and all orders for this visit:  Hordeolum externum of right lower eyelid -     erythromycin ophthalmic ointment; Place 1 application into the right eye at bedtime.  Type 2 diabetes mellitus with diabetic polyneuropathy, without long-term current use of insulin (HCC) -     Hemoglobin A1c; Future   . COVID-19 Education: The signs and symptoms of COVID-19 were discussed with the patient and how to seek care for testing if needed. The importance of social distancing was discussed today. . Reviewed expectations re: course of current medical issues. . Discussed self-management of symptoms. . Outlined signs and  symptoms indicating need for more acute intervention. . Patient verbalized understanding and all questions were answered. Marland Kitchen Health Maintenance issues including appropriate healthy diet, exercise, and smoking avoidance were discussed with patient. . See orders for this visit as documented in the electronic medical record.  Brett Deutscher, Brett [name]  Records requested if needed. Time spent: 15 minutes, of which >50% was spent in obtaining information about his symptoms, reviewing his previous labs, evaluations, and treatments, counseling him about his condition (please see the discussed topics above), and developing a plan to further investigate it; he had a number of questions which I addressed.

## 2018-07-31 ENCOUNTER — Ambulatory Visit (INDEPENDENT_AMBULATORY_CARE_PROVIDER_SITE_OTHER): Payer: BC Managed Care – PPO | Admitting: Family Medicine

## 2018-07-31 ENCOUNTER — Encounter: Payer: Self-pay | Admitting: Family Medicine

## 2018-07-31 ENCOUNTER — Other Ambulatory Visit: Payer: Self-pay

## 2018-07-31 VITALS — Ht 72.0 in

## 2018-07-31 DIAGNOSIS — H00012 Hordeolum externum right lower eyelid: Secondary | ICD-10-CM | POA: Diagnosis not present

## 2018-07-31 DIAGNOSIS — E1142 Type 2 diabetes mellitus with diabetic polyneuropathy: Secondary | ICD-10-CM | POA: Diagnosis not present

## 2018-07-31 MED ORDER — ERYTHROMYCIN 5 MG/GM OP OINT: 1.0000 "application " | TOPICAL_OINTMENT | Freq: Every day | OPHTHALMIC | 0 refills | Status: DC

## 2018-08-01 ENCOUNTER — Other Ambulatory Visit (INDEPENDENT_AMBULATORY_CARE_PROVIDER_SITE_OTHER): Payer: BC Managed Care – PPO

## 2018-08-01 DIAGNOSIS — E291 Testicular hypofunction: Secondary | ICD-10-CM

## 2018-08-01 DIAGNOSIS — E1142 Type 2 diabetes mellitus with diabetic polyneuropathy: Secondary | ICD-10-CM

## 2018-08-01 LAB — CBC
HCT: 48.7 % (ref 39.0–52.0)
Hemoglobin: 16.7 g/dL (ref 13.0–17.0)
MCHC: 34.3 g/dL (ref 30.0–36.0)
MCV: 87.9 fl (ref 78.0–100.0)
Platelets: 123 10*3/uL — ABNORMAL LOW (ref 150.0–400.0)
RBC: 5.54 Mil/uL (ref 4.22–5.81)
RDW: 14.1 % (ref 11.5–15.5)
WBC: 5.7 10*3/uL (ref 4.0–10.5)

## 2018-08-01 LAB — COMPREHENSIVE METABOLIC PANEL
ALT: 31 U/L (ref 0–53)
AST: 17 U/L (ref 0–37)
Albumin: 4.8 g/dL (ref 3.5–5.2)
Alkaline Phosphatase: 33 U/L — ABNORMAL LOW (ref 39–117)
BUN: 22 mg/dL (ref 6–23)
CO2: 27 mEq/L (ref 19–32)
Calcium: 9.3 mg/dL (ref 8.4–10.5)
Chloride: 102 mEq/L (ref 96–112)
Creatinine, Ser: 0.85 mg/dL (ref 0.40–1.50)
GFR: 98.11 mL/min (ref >60.00)
Glucose, Bld: 93 mg/dL (ref 70–99)
Potassium: 4.4 mEq/L (ref 3.5–5.1)
Sodium: 140 mEq/L (ref 135–145)
Total Bilirubin: 1.1 mg/dL (ref 0.2–1.2)
Total Protein: 7 g/dL (ref 6.0–8.3)

## 2018-08-01 LAB — HEMOGLOBIN A1C: Hgb A1c MFr Bld: 5.4 % (ref 4.6–6.5)

## 2018-08-05 LAB — TESTOS,TOTAL,FREE AND SHBG (FEMALE)
Free Testosterone: 28.8 pg/mL — ABNORMAL LOW (ref 35.0–155.0)
Sex Hormone Binding: 39 nmol/L (ref 10–50)
Testosterone, Total, LC-MS-MS: 203 ng/dL — ABNORMAL LOW (ref 250–1100)

## 2018-08-07 NOTE — Progress Notes (Signed)
Virtual Visit via Video   Due to the COVID-19 pandemic, this visit was completed with telemedicine (audio/video) technology to reduce patient and provider exposure as well as to preserve personal protective equipment.   I connected with Brett [name] by a video enabled telemedicine application and verified that I am speaking with the correct person using two identifiers. Location patient: Home [name], CMA acting as scribe for Dr. Briscoe Deutscher.  Location provider: [facility], Office Persons participating in the virtual visit: Giovanne Nickolson, Briscoe Deutscher, DO   I discussed the limitations of evaluation and management by telemedicine and the availability of in person appointments. The patient expressed understanding and agreed to proceed.  Care Team   Patient Care Team: Briscoe Deutscher, DO as PCP - General (Family Medicine) Newt Minion, MD as Consulting Physician (Orthopedic Surgery) Gerda Diss, DO as Consulting Physician (Family Medicine) Hutto, Algis Greenhouse, OD (Optometry)  Subjective:   HPI: Patient has had a lot if issues with willingness to do anything. He has had issues with weight gain from last app. He has been out of Vyvanse and labs show low testosterone. We will make adjustments on both medications today.    1. Attention deficit hyperactivity disorder (ADHD), combined type  Since the last visit has the patient had any: Patient has not had Vyvanse 60mg  for several months. We will start back today.   Appetite changes? No Unintentional weight loss? No Is medication working well ? Yes Does patient take drug holidays? No Difficulties falling to sleep or maintaining sleep? No Any anxiety?  No Any cardiac issues (fainting or paliptations)? No Suicidal thoughts? No Changes in health since last visit? No New medications? No Any illicit substance abuse? No Has the patient taken his medication today? No   2. Hypogonadism in male. Genito-Urinary  ROS: negative. Currently taking testosterone .5mg  bi weekly. We are going to increase to 79ml weekly for one month then 68ml bi weekly for two months then recheck labs .   3. Type 2 diabetes mellitus with diabetic polyneuropathy, without long-term current use of insulin.  Lab Results  Component Value Date   HGBA1C 5.4 08/01/2018   HGBA1C 5.0 09/27/2017   HGBA1C 5.4 03/29/2017   Lab Results  Component Value Date   LDLCALC 54 09/27/2017   CREATININE 0.85 08/01/2018     Patient Active Problem List   Diagnosis Date Noted  . Attention deficit hyperactivity disorder (ADHD), combined type 09/27/2017  . Unilateral primary osteoarthritis, left knee 07/10/2017  . Spondylolisthesis, lumbosacral region 07/10/2017  . Chronic midline low back pain without sciatica 07/10/2017  . Chronic left shoulder pain 07/10/2017  . Multiple joint pain 05/20/2017  . Hypogonadism in male 05/20/2017  . Shoulder impingement 06/16/2016  . Hyperlipidemia associated with type 2 diabetes mellitus ([place]) 06/16/2016  . Type 2 diabetes mellitus with diabetic polyneuropathy, without long-term current use of insulin ([place]) 06/16/2016  . Bipolar 2 disorder ([place]) 06/15/2016  . Left rotator cuff tear arthropathy, s/p PT and repair 06/15/2016  . Hypertension associated with diabetes ([place]) 06/15/2016  . Gastroesophageal reflux disease without esophagitis 06/15/2016  . Chronic seasonal allergic rhinitis due to pollen 06/15/2016  . Peripheral polyneuropathy 06/15/2016  . Morbid obesity ([place]) 06/15/2016  . Insomnia 06/15/2016  . Chronic pain of both knees 06/15/2016  . Asthma     Social History   Tobacco Use  . Smoking status: Never Smoker  . Smokeless tobacco: Never Used  Substance Use Topics  . Alcohol use:  No    Current Outpatient Medications:  .  albuterol (PROVENTIL HFA;VENTOLIN HFA) 108 (90 Base) MCG/ACT inhaler, TAKE 2 PUFFS BY MOUTH EVERY 6 HOURS AS NEEDED FOR WHEEZE OR SHORTNESS OF BREATH, Disp: 25.5 Inhaler,  Rfl: 1 .  buPROPion (WELLBUTRIN XL) 150 MG 24 hr tablet, TAKE 1 TABLET (150 MG TOTAL) BY MOUTH DAILY, Disp: 180 tablet, Rfl: 1 .  Calcium Carb-Cholecalciferol (CALCIUM 1000 + D) 1000-800 MG-UNIT TABS, , Disp: , Rfl:  .  celecoxib (CELEBREX) 100 MG capsule, Take 1 capsule (100 mg total) by mouth 2 (two) times daily as needed., Disp: 180 capsule, Rfl: 1 .  fluticasone (FLOVENT HFA) 110 MCG/ACT inhaler, Inhale 1 puff into the lungs 2 (two) times daily., Disp: 1 Inhaler, Rfl: 12 .  liraglutide (VICTOZA) 18 MG/3ML SOPN, Inject 0.2 mLs (1.2 mg total) into the skin daily., Disp: 6 pen, Rfl: 3 .  Lisdexamfetamine Dimesylate (VYVANSE) 60 MG CHEW, Chew 1 tablet by mouth daily., Disp: 30 tablet, Rfl: 0 .  lisinopril (PRINIVIL,ZESTRIL) 5 MG tablet, Take 1 tablet (5 mg total) by mouth daily., Disp: 90 tablet, Rfl: 3 .  MAGNESIUM CITRATE PO, Take 1 tablet by mouth every evening., Disp: , Rfl:  .  methocarbamol (ROBAXIN-750) 750 MG tablet, Take 1 tablet (750 mg total) by mouth 3 (three) times daily., Disp: 270 tablet, Rfl: 1 .  omeprazole (PRILOSEC) 40 MG capsule, Take 1 capsule (40 mg total) by mouth daily., Disp: 90 capsule, Rfl: 1 .  Potassium Gluconate 550 MG TABS, , Disp: , Rfl:  .  pregabalin (LYRICA) 200 MG capsule, Take 1 capsule (200 mg total) by mouth 2 (two) times daily., Disp: 180 capsule, Rfl: 3 .  rosuvastatin (CRESTOR) 10 MG tablet, Take 1 tablet (10 mg total) by mouth daily., Disp: 90 tablet, Rfl: 3 .  SYRINGE-NEEDLE, DISP, 3 ML (B-D 3CC LUER-LOK SYR 22GX1-1/2) 22G X 1-1/2" 3 ML MISC, 1 each by Other route once a week., Disp: 4 each, Rfl: 12 .  testosterone cypionate (DEPOTESTOSTERONE CYPIONATE) 200 MG/ML injection, Take 12ml weekly for 4 weeks then 1 ml biweekly for 2 months., Disp: 10 mL, Rfl: 0 .  Lisdexamfetamine Dimesylate (VYVANSE) 60 MG CHEW, Chew 60 mg by mouth daily., Disp: 30 tablet, Rfl: 0 .  [START ON 09/08/2018] Lisdexamfetamine Dimesylate (VYVANSE) 60 MG CHEW, Chew 60 mg by mouth  daily., Disp: 30 tablet, Rfl: 0 .  [START ON 10/08/2018] Lisdexamfetamine Dimesylate (VYVANSE) 60 MG CHEW, Chew 60 mg by mouth daily., Disp: 30 tablet, Rfl: 0  No Known Allergies  Objective:   VITALS: Per patient if applicable, see vitals. GENERAL: Alert, appears well and in no acute distress. HEENT: Atraumatic, conjunctiva clear, no obvious abnormalities on inspection of external nose and ears. NECK: Normal movements of the head and neck. CARDIOPULMONARY: No increased WOB. Speaking in clear sentences. I:E ratio WNL.  MS: Moves all visible extremities without noticeable abnormality. PSYCH: Pleasant and cooperative, well-groomed. Speech normal rate and rhythm. Affect is appropriate. Insight and judgement are appropriate. Attention is focused, linear, and appropriate.  NEURO: CN grossly intact. Oriented as arrived to appointment on time with no prompting. Moves both UE equally.  SKIN: No obvious lesions, wounds, erythema, or cyanosis noted on face or hands.  Depression screen [facility] 2/9 03/16/2018 12/13/2017 09/27/2017  Decreased Interest 3 1 0  Down, Depressed, Hopeless 1 2 0  PHQ - 2 Score 4 3 0  Altered sleeping 3 3 0  Tired, decreased energy 3 3 0  Change in appetite  1 2 0  Feeling bad or failure about yourself  0 2 0  Trouble concentrating 0 2 1  Moving slowly or fidgety/restless 0 2 0  Suicidal thoughts 0 0 0  PHQ-9 Score 11 17 1   Difficult doing work/chores Somewhat difficult Somewhat difficult Not difficult at all   Assessment and Plan:   Osiris was seen today for follow-up.  Diagnoses and all orders for this visit:  Attention deficit hyperactivity disorder (ADHD), combined type -     Lisdexamfetamine Dimesylate (VYVANSE) 60 MG CHEW; Chew 60 mg by mouth daily. -     Lisdexamfetamine Dimesylate (VYVANSE) 60 MG CHEW; Chew 60 mg by mouth daily. -     Lisdexamfetamine Dimesylate (VYVANSE) 60 MG CHEW; Chew 60 mg by mouth daily.  Hypogonadism in male -     testosterone cypionate  (DEPOTESTOSTERONE CYPIONATE) 200 MG/ML injection; Take 36ml weekly for 4 weeks then 1 ml biweekly for 2 months. -     SYRINGE-NEEDLE, DISP, 3 ML (B-D 3CC LUER-LOK SYR 22GX1-1/2) 22G X 1-1/2" 3 ML MISC; 1 each by Other route once a week.  Type 2 diabetes mellitus with diabetic polyneuropathy, without long-term current use of insulin (HCC)  Malaise and fatigue   . COVID-19 Education: The signs and symptoms of COVID-19 were discussed with the patient and how to seek care for testing if needed. The importance of social distancing was discussed today. . Reviewed expectations re: course of current medical issues. . Discussed self-management of symptoms. . Outlined signs and symptoms indicating need for more acute intervention. . Patient verbalized understanding and all questions were answered. Marland Kitchen Health Maintenance issues including appropriate healthy diet, exercise, and smoking avoidance were discussed with patient. . See orders for this visit as documented in the electronic medical record.  Briscoe Deutscher, DO  Records requested if needed. Time spent: 25 minutes, of which >50% was spent in obtaining information about his symptoms, reviewing his previous labs, evaluations, and treatments, counseling him about his condition (please see the discussed topics above), and developing a plan to further investigate it; he had a number of questions which I addressed.

## 2018-08-08 ENCOUNTER — Encounter: Payer: Self-pay | Admitting: Family Medicine

## 2018-08-08 ENCOUNTER — Other Ambulatory Visit: Payer: Self-pay

## 2018-08-08 ENCOUNTER — Ambulatory Visit (INDEPENDENT_AMBULATORY_CARE_PROVIDER_SITE_OTHER): Payer: BC Managed Care – PPO | Admitting: Family Medicine

## 2018-08-08 VITALS — HR 68 | Ht 72.0 in | Wt 256.0 lb

## 2018-08-08 DIAGNOSIS — E1142 Type 2 diabetes mellitus with diabetic polyneuropathy: Secondary | ICD-10-CM | POA: Diagnosis not present

## 2018-08-08 DIAGNOSIS — F902 Attention-deficit hyperactivity disorder, combined type: Secondary | ICD-10-CM

## 2018-08-08 DIAGNOSIS — R5381 Other malaise: Secondary | ICD-10-CM | POA: Diagnosis not present

## 2018-08-08 DIAGNOSIS — E291 Testicular hypofunction: Secondary | ICD-10-CM | POA: Diagnosis not present

## 2018-08-08 DIAGNOSIS — R5383 Other fatigue: Secondary | ICD-10-CM

## 2018-08-08 MED ORDER — LISDEXAMFETAMINE DIMESYLATE 60 MG PO CHEW: 60.0000 mg | CHEWABLE_TABLET | Freq: Every day | ORAL | 0 refills | Status: DC

## 2018-08-08 MED ORDER — "SYRINGE/NEEDLE (DISP) 22G X 1-1/2"" 3 ML MISC": 1.0000 | 12 refills | Status: DC

## 2018-08-08 MED ORDER — TESTOSTERONE CYPIONATE 200 MG/ML IM SOLN: INTRAMUSCULAR | 0 refills | Status: DC

## 2018-08-27 ENCOUNTER — Other Ambulatory Visit: Payer: Self-pay | Admitting: Family Medicine

## 2018-09-07 ENCOUNTER — Encounter: Payer: Self-pay | Admitting: Family Medicine

## 2018-09-19 ENCOUNTER — Other Ambulatory Visit: Payer: Self-pay

## 2018-09-19 ENCOUNTER — Other Ambulatory Visit (INDEPENDENT_AMBULATORY_CARE_PROVIDER_SITE_OTHER): Payer: BC Managed Care – PPO

## 2018-09-19 DIAGNOSIS — E1142 Type 2 diabetes mellitus with diabetic polyneuropathy: Secondary | ICD-10-CM

## 2018-09-19 DIAGNOSIS — E291 Testicular hypofunction: Secondary | ICD-10-CM

## 2018-09-19 LAB — CBC
HCT: 50.5 % (ref 39.0–52.0)
Hemoglobin: 16.8 g/dL (ref 13.0–17.0)
MCHC: 33.3 g/dL (ref 30.0–36.0)
MCV: 91.2 fl (ref 78.0–100.0)
Platelets: 121 10*3/uL — ABNORMAL LOW (ref 150.0–400.0)
RBC: 5.54 Mil/uL (ref 4.22–5.81)
RDW: 14.2 % (ref 11.5–15.5)
WBC: 5.6 10*3/uL (ref 4.0–10.5)

## 2018-09-19 LAB — COMPREHENSIVE METABOLIC PANEL
ALT: 19 U/L (ref 0–53)
AST: 19 U/L (ref 0–37)
Albumin: 4.9 g/dL (ref 3.5–5.2)
Alkaline Phosphatase: 43 U/L (ref 39–117)
BUN: 11 mg/dL (ref 6–23)
CO2: 30 mEq/L (ref 19–32)
Calcium: 9.3 mg/dL (ref 8.4–10.5)
Chloride: 101 mEq/L (ref 96–112)
Creatinine, Ser: 0.87 mg/dL (ref 0.40–1.50)
GFR: 95.45 mL/min (ref >60.00)
Glucose, Bld: 79 mg/dL (ref 70–99)
Potassium: 5 mEq/L (ref 3.5–5.1)
Sodium: 139 mEq/L (ref 135–145)
Total Bilirubin: 1.1 mg/dL (ref 0.2–1.2)
Total Protein: 6.9 g/dL (ref 6.0–8.3)

## 2018-09-19 NOTE — Addendum Note (Signed)
Addended by: Francis Dowse T on: 09/19/2018 09:05 AM   Modules accepted: Orders

## 2018-09-19 NOTE — Addendum Note (Signed)
Addended by: Sandford Craze on: 09/19/2018 09:12 AM   Modules accepted: Orders

## 2018-09-24 ENCOUNTER — Other Ambulatory Visit: Payer: Self-pay | Admitting: Family Medicine

## 2018-09-24 DIAGNOSIS — E291 Testicular hypofunction: Secondary | ICD-10-CM

## 2018-09-26 ENCOUNTER — Encounter: Payer: Self-pay | Admitting: Family Medicine

## 2018-10-02 NOTE — Telephone Encounter (Signed)
Can you let me know about this?

## 2018-10-03 ENCOUNTER — Other Ambulatory Visit: Payer: Self-pay | Admitting: Family Medicine

## 2018-10-03 DIAGNOSIS — M2550 Pain in unspecified joint: Secondary | ICD-10-CM

## 2018-10-04 ENCOUNTER — Encounter: Payer: Self-pay | Admitting: Family Medicine

## 2018-10-05 NOTE — Telephone Encounter (Signed)
Dr. Juleen China said for pt to hold injection till after blood draw on Monday.

## 2018-10-05 NOTE — Telephone Encounter (Signed)
Dr. Juleen China, got patient scheduled for Testosterone level blood draw for Monday 8:45. Pt is due to give himself a shot today should he wait till after blood draw on Monday to give shot?

## 2018-10-05 NOTE — Telephone Encounter (Signed)
I contacted the patient and explained the information below.

## 2018-10-08 ENCOUNTER — Other Ambulatory Visit: Payer: Self-pay

## 2018-10-08 ENCOUNTER — Telehealth: Payer: Self-pay | Admitting: Family Medicine

## 2018-10-08 ENCOUNTER — Other Ambulatory Visit (INDEPENDENT_AMBULATORY_CARE_PROVIDER_SITE_OTHER): Payer: BC Managed Care – PPO

## 2018-10-08 DIAGNOSIS — E291 Testicular hypofunction: Secondary | ICD-10-CM | POA: Diagnosis not present

## 2018-10-08 NOTE — Telephone Encounter (Signed)
MEDICATION:  Lisdexamfetamine Dimesylate (VYVANSE) 60 MG CHEW  PHARMACY:   [id number] - [place], [place] - [address] [phone] (Phone) [phone] (Fax)     IS THIS A 90 DAY SUPPLY : NO  IS PATIENT OUT OF MEDICATION: YES  IF NOT; HOW MUCH IS LEFT:   LAST APPOINTMENT DATE: @7 /10/2018  NEXT APPOINTMENT DATE:@7 /13/2020  OTHER COMMENTS:  WOULD LIKE TO GET 90 DAYS   **Let patient know to contact pharmacy at the end of the day to make sure medication is ready. **  ** Please notify patient to allow 48-72 hours to process**  **Encourage patient to contact the pharmacy for refills or they can request refills through [facility]**

## 2018-10-08 NOTE — Telephone Encounter (Signed)
Patient had labs performed this morning.

## 2018-10-08 NOTE — Telephone Encounter (Signed)
Rx was sent to pharmacy today. 

## 2018-10-09 ENCOUNTER — Encounter: Payer: Self-pay | Admitting: Family Medicine

## 2018-10-09 ENCOUNTER — Other Ambulatory Visit: Payer: Self-pay

## 2018-10-09 DIAGNOSIS — F902 Attention-deficit hyperactivity disorder, combined type: Secondary | ICD-10-CM

## 2018-10-09 MED ORDER — VYVANSE 60 MG PO CHEW: 60.0000 mg | CHEWABLE_TABLET | Freq: Every day | ORAL | 0 refills | Status: DC

## 2018-10-09 NOTE — Progress Notes (Signed)
Rx request Last OV 08/08/18 Last fill  09/08/18  #30/0

## 2018-10-10 ENCOUNTER — Encounter: Payer: Self-pay | Admitting: Family Medicine

## 2018-10-10 DIAGNOSIS — F902 Attention-deficit hyperactivity disorder, combined type: Secondary | ICD-10-CM

## 2018-10-10 MED ORDER — VYVANSE 60 MG PO CHEW: 60.0000 mg | CHEWABLE_TABLET | Freq: Every day | ORAL | 0 refills | Status: DC

## 2018-10-11 MED ORDER — VYVANSE 60 MG PO CHEW: 60.0000 mg | CHEWABLE_TABLET | Freq: Every day | ORAL | 0 refills | Status: DC

## 2018-10-12 ENCOUNTER — Encounter: Payer: Self-pay | Admitting: Family Medicine

## 2018-10-12 LAB — TESTOS,TOTAL,FREE AND SHBG (FEMALE)
Free Testosterone: 59.8 pg/mL (ref 35.0–155.0)
Sex Hormone Binding: 33 nmol/L (ref 10–50)
Testosterone, Total, LC-MS-MS: 374 ng/dL (ref 250–1100)

## 2018-10-22 ENCOUNTER — Other Ambulatory Visit: Payer: Self-pay

## 2018-10-24 ENCOUNTER — Other Ambulatory Visit: Payer: Self-pay

## 2018-10-24 DIAGNOSIS — M2550 Pain in unspecified joint: Secondary | ICD-10-CM

## 2018-11-09 ENCOUNTER — Encounter: Payer: Self-pay | Admitting: Family Medicine

## 2018-11-09 ENCOUNTER — Ambulatory Visit: Payer: BC Managed Care – PPO | Admitting: Family Medicine

## 2018-11-15 HISTORY — PX: PANNICULECTOMY: SHX5360

## 2018-11-16 ENCOUNTER — Ambulatory Visit: Payer: BC Managed Care – PPO | Admitting: Family Medicine

## 2018-11-19 ENCOUNTER — Ambulatory Visit: Payer: BC Managed Care – PPO | Admitting: Family Medicine

## 2018-12-06 NOTE — Progress Notes (Signed)
[name] [place], [place] [address] Phone: [phone] Subjective:   I Brett [name] am serving as a Education administrator for Dr. Hulan Saas.   CC: Polyarthralgia  [id number]    Brett [name] is a 43 y.o. male coming in with complaint of polyarthralgia. Had surgery once month ago and thus hasn't been as active. Usually does ji jitsu 6x a week, walked 5 miles a day, and do 200 push ups daily. He states that he is in constant pain in the hips, knees, elbows, and shoulders. He also notes an increase in popping and clicking in his joints. Overall feels that he never fully recovers after doing any activity. Chronic. States he does more than what is listed above. 2018 he was 380lbs. Lost a lot of weight so overall he feels better.  Patient with states that unfortunately he tries to increase activity seems to hit a wall on a more regular basis.     Past Medical History:  Diagnosis Date  . Asthma   . Bipolar 1 disorder ([facility]) 06/15/2016   Previously followed by Psychiatry. Hx of "anger issues." Hx of medication use has included Seroquel and Ativan, both of which have helped him in the past.   . Chronic pain of both knees 06/15/2016  . Chronic seasonal allergic rhinitis due to pollen 06/15/2016  . Depression   . Gastroesophageal reflux disease without esophagitis 06/15/2016  . Hypertension   . Insomnia 06/15/2016  . Left rotator cuff tear arthropathy 06/15/2016   s/p repair and PT. No ongoing issues.  . Morbid obesity ([place] [facility]) 06/15/2016   Past Surgical History:  Procedure Laterality Date  . BICEPS TENDON REPAIR     Social History   Socioeconomic History  . Marital status: Married    Spouse name: Not on file  . Number of children: 2  . Years of education: Not on file  . Highest education level: Not on file  Occupational History  . Occupation: Camera operator: [employer]  . Financial resource strain:  Not on file  . Food insecurity    Worry: Not on file    Inability: Not on file  . Transportation needs    Medical: Not on file    Non-medical: Not on file  Tobacco Use  . Smoking status: Never Smoker  . Smokeless tobacco: Never Used  Substance and Sexual Activity  . Alcohol use: No  . Drug use: No  . Sexual activity: Yes  Lifestyle  . Physical activity    Days per week: Not on file    Minutes per session: Not on file  . Stress: Not on file  Relationships  . Social Herbalist on phone: Not on file    Gets together: Not on file    Attends religious service: Not on file    Active member of club or organization: Not on file    Attends meetings of clubs or organizations: Not on file    Relationship status: Not on file  Other Topics Concern  . Not on file  Social History Narrative   Lives with wife and two children, all patients of [facility]. Wants to get back to weight lifting. Lives near [address] in [place].   No Known Allergies No family history on file.  Current Outpatient Medications (Endocrine & Metabolic):  .  liraglutide (VICTOZA) 18 MG/3ML SOPN, Inject 0.2 mLs (1.2 mg total) into  the skin daily. Marland Kitchen  testosterone cypionate (DEPOTESTOSTERONE CYPIONATE) 200 MG/ML injection, INJECT 1 MILLILITER ONCE WEEKLY FOR 4 WEEKS THEN 1 MILLILITER BIWEEKLY FOR 2 MONTHS  Current Outpatient Medications (Cardiovascular):  .  lisinopril (PRINIVIL,ZESTRIL) 5 MG tablet, Take 1 tablet (5 mg total) by mouth daily. .  rosuvastatin (CRESTOR) 10 MG tablet, Take 1 tablet (10 mg total) by mouth daily.  Current Outpatient Medications (Respiratory):  .  albuterol (PROVENTIL HFA;VENTOLIN HFA) 108 (90 Base) MCG/ACT inhaler, TAKE 2 PUFFS BY MOUTH EVERY 6 HOURS AS NEEDED FOR WHEEZE OR SHORTNESS OF BREATH .  fluticasone (FLOVENT HFA) 110 MCG/ACT inhaler, Inhale 1 puff into the lungs 2 (two) times daily.  Current Outpatient Medications (Analgesics):  .  celecoxib (CELEBREX) 100 MG capsule,  TAKE 1 CAPSULE (100 MG TOTAL) BY MOUTH 2 (TWO) TIMES DAILY AS NEEDED.   Current Outpatient Medications (Other):  Marland Kitchen  buPROPion (WELLBUTRIN XL) 150 MG 24 hr tablet, TAKE 1 TABLET (150 MG TOTAL) BY MOUTH DAILY .  Calcium Carb-Cholecalciferol (CALCIUM 1000 + D) 1000-800 MG-UNIT TABS,  .  Insulin Pen Needle (B-D UF III MINI PEN NEEDLES) 31G X 5 MM MISC, USE TO INJECT VICTOZA DAILY .  Lisdexamfetamine Dimesylate (VYVANSE) 60 MG CHEW, Chew 1 tablet by mouth daily. .  Lisdexamfetamine Dimesylate (VYVANSE) 60 MG CHEW, Chew 60 mg by mouth daily. .  Lisdexamfetamine Dimesylate (VYVANSE) 60 MG CHEW, Chew 60 mg by mouth daily. .  Lisdexamfetamine Dimesylate (VYVANSE) 60 MG CHEW, Chew 60 mg by mouth daily. Marland Kitchen  MAGNESIUM CITRATE PO*, Take 1 tablet by mouth every evening. .  methocarbamol (ROBAXIN-750) 750 MG tablet, Take 1 tablet (750 mg total) by mouth 3 (three) times daily. Marland Kitchen  omeprazole (PRILOSEC) 40 MG capsule, Take 1 capsule (40 mg total) by mouth daily. .  Potassium Gluconate 550 MG TABS,  .  SYRINGE-NEEDLE, DISP, 3 ML (B-D 3CC LUER-LOK SYR 22GX1-1/2) 22G X 1-1/2" 3 ML MISC, 1 each by Other route once a week. .  pregabalin (LYRICA) 200 MG capsule, Take 1 capsule (200 mg total) by mouth 2 (two) times daily. * These medications belong to multiple therapeutic classes and are listed under each applicable group.    Past medical history, social, surgical and family history all reviewed in electronic medical record.  No pertanent information unless stated regarding to the chief complaint.   Review of Systems:  No headache, visual changes, nausea, vomiting, diarrhea, constipation, dizziness, abdominal pain, skin rash, fevers, chills, night sweats, weight loss, swollen lymph nodes, body aches, joint swelling, chest pain, shortness of breath, mood changes.  Positive muscle aches  Objective  Blood pressure 120/68, pulse 95, height 6' (1.829 m), weight 229 lb (103.9 kg), SpO2 98 %.    General: No apparent  distress alert and oriented x3 mood and affect normal, dressed appropriately.  HEENT: Pupils equal, extraocular movements intact  Respiratory: Patient's speak in full sentences and does not appear short of breath  Cardiovascular: No lower extremity edema, non tender, no erythema  Skin: Warm dry intact with no signs of infection or rash on extremities or on axial skeleton.  Abdomen: Soft nontender  Neuro: Cranial nerves II through XII are intact, neurovascularly intact in all extremities with 2+ DTRs and 2+ pulses.  Lymph: No lymphadenopathy of posterior or anterior cervical chain or axillae bilaterally.  Gait normal with good balance and coordination.  MSK:   tender with limitedrange of motion and good stability and symmetric strength and tone of shoulders, elbows, wrist, hip, knee and  ankles bilaterally.  Very minimal discomfort of all joints and muscles.    Impression and Recommendations:     This case required medical decision making of moderate complexity. The above documentation has been reviewed and is accurate and complete [name], DO       Note: This dictation was prepared with Dragon dictation along with smaller phrase technology. Any transcriptional errors that result from this process are unintentional.

## 2018-12-07 ENCOUNTER — Other Ambulatory Visit: Payer: Self-pay

## 2018-12-07 ENCOUNTER — Other Ambulatory Visit (INDEPENDENT_AMBULATORY_CARE_PROVIDER_SITE_OTHER): Payer: BC Managed Care – PPO

## 2018-12-07 ENCOUNTER — Encounter: Payer: Self-pay | Admitting: Family Medicine

## 2018-12-07 ENCOUNTER — Ambulatory Visit: Payer: BC Managed Care – PPO | Admitting: Family Medicine

## 2018-12-07 VITALS — BP 120/68 | HR 95 | Ht 72.0 in | Wt 229.0 lb

## 2018-12-07 DIAGNOSIS — M2550 Pain in unspecified joint: Secondary | ICD-10-CM

## 2018-12-07 LAB — SEDIMENTATION RATE: Sed Rate: 9 mm/hr (ref 0–15)

## 2018-12-07 LAB — T4, FREE: Free T4: 0.84 ng/dL (ref 0.60–1.60)

## 2018-12-07 LAB — VITAMIN B12: Vitamin B-12: 296 pg/mL (ref 211–911)

## 2018-12-07 LAB — URIC ACID: Uric Acid, Serum: 5.4 mg/dL (ref 4.0–7.8)

## 2018-12-07 LAB — IBC PANEL
Iron: 60 ug/dL (ref 42–165)
Saturation Ratios: 13.8 % — ABNORMAL LOW (ref 20.0–50.0)
Transferrin: 311 mg/dL (ref 212.0–360.0)

## 2018-12-07 LAB — T3, FREE: T3, Free: 3.3 pg/mL (ref 2.3–4.2)

## 2018-12-07 LAB — TSH: TSH: 1.62 u[IU]/mL (ref 0.35–4.50)

## 2018-12-07 LAB — FOLATE: Folate: 8.2 ng/mL (ref >5.9)

## 2018-12-07 NOTE — Assessment & Plan Note (Signed)
Multiple joint pain, polyarthralgia, I do believe that this is more secondary to patient's weight loss.  Has been losing weight and doing well but unfortunately has been fairly severe at the moment.  We discussed icing regimen and home exercises, which activities of doing which wants to avoid.  Patient is to increase activity slowly and make the adjustments of the nutrition we discussed.  Laboratory work-up ordered as well.  Follow-up again in 4 to 8 weeks

## 2018-12-07 NOTE — Patient Instructions (Signed)
Good to see you  Labs downstairs G2 with yoru water when working out a lot Rest day 1 day a week  Eat within 30 minutes of working out and at least 60 grams of carbs and 20 grams of protein  My fitness pal for 3 days and look at total calories and protein  Eat something every 2 hours during the day.  Tart cherry extract 1200mg  at night  DHEA 50 mg daily for 4 weeks.  See me agai nin 4 weeks

## 2018-12-09 ENCOUNTER — Other Ambulatory Visit: Payer: Self-pay | Admitting: Family Medicine

## 2018-12-09 DIAGNOSIS — G629 Polyneuropathy, unspecified: Secondary | ICD-10-CM

## 2018-12-11 ENCOUNTER — Other Ambulatory Visit: Payer: Self-pay

## 2018-12-11 DIAGNOSIS — G629 Polyneuropathy, unspecified: Secondary | ICD-10-CM

## 2018-12-11 LAB — VITAMIN D 1,25 DIHYDROXY
Vitamin D 1, 25 (OH)2 Total: 38 pg/mL (ref 18–72)
Vitamin D2 1, 25 (OH)2: <8 pg/mL
Vitamin D3 1, 25 (OH)2: 38 pg/mL

## 2018-12-11 LAB — CALCIUM, IONIZED: Calcium, Ion: 5 mg/dL (ref 4.8–5.6)

## 2018-12-11 LAB — ANA: Anti Nuclear Antibody (ANA): NEGATIVE

## 2018-12-11 LAB — PTH, INTACT AND CALCIUM
Calcium: 9.6 mg/dL (ref 8.6–10.3)
PTH: 17 pg/mL (ref 14–64)

## 2018-12-12 MED ORDER — PREGABALIN 200 MG PO CAPS: 200.0000 mg | ORAL_CAPSULE | Freq: Two times a day (BID) | ORAL | 2 refills | Status: DC

## 2018-12-14 ENCOUNTER — Other Ambulatory Visit: Payer: Self-pay

## 2018-12-14 DIAGNOSIS — G629 Polyneuropathy, unspecified: Secondary | ICD-10-CM

## 2018-12-14 NOTE — Telephone Encounter (Signed)
  LAST APPOINTMENT DATE: @@LASTENCT   NEXT APPOINTMENT DATE:@Visit  date not found  MEDICATION: pregabalin (LYRICA) 200 MG capsule  PHARMACY: [id number] - [place], [place] [phone] (Phone) [phone] (Fax)   Pt already called pharmacy   Let patient know to contact pharmacy at the end of the day to make sure medication is ready.   Please notify patient to allow 48-72 hours to process Encourage patient to contact the pharmacy for refills or they can request refills through [facility]** CLINICAL FILLS OUT ALL BELOW:   LAST REFILL:  QTY:  REFILL DATE:    OTHER COMMENTS:    Okay for refill?  Please advise

## 2018-12-14 NOTE — Telephone Encounter (Signed)
Ok to fill 

## 2018-12-15 NOTE — Telephone Encounter (Signed)
Yes, with refills.

## 2018-12-17 ENCOUNTER — Other Ambulatory Visit: Payer: Self-pay | Admitting: Family Medicine

## 2018-12-17 DIAGNOSIS — G629 Polyneuropathy, unspecified: Secondary | ICD-10-CM

## 2018-12-17 MED ORDER — PREGABALIN 200 MG PO CAPS: 200.0000 mg | ORAL_CAPSULE | Freq: Two times a day (BID) | ORAL | 2 refills | Status: DC

## 2018-12-17 NOTE — Addendum Note (Signed)
Addended by: Francella Solian on: 12/17/2018 02:31 PM   Modules accepted: Orders

## 2018-12-17 NOTE — Telephone Encounter (Signed)
Pulled up can you send in?

## 2018-12-19 ENCOUNTER — Encounter: Payer: Self-pay | Admitting: Family Medicine

## 2018-12-19 ENCOUNTER — Other Ambulatory Visit: Payer: Self-pay | Admitting: Family Medicine

## 2018-12-19 DIAGNOSIS — J4530 Mild persistent asthma, uncomplicated: Secondary | ICD-10-CM

## 2018-12-20 ENCOUNTER — Encounter: Payer: Self-pay | Admitting: Family Medicine

## 2018-12-20 ENCOUNTER — Other Ambulatory Visit: Payer: Self-pay | Admitting: Family Medicine

## 2018-12-20 DIAGNOSIS — F902 Attention-deficit hyperactivity disorder, combined type: Secondary | ICD-10-CM

## 2018-12-20 MED ORDER — VYVANSE 60 MG PO CHEW: 1.0000 | CHEWABLE_TABLET | Freq: Every day | ORAL | 0 refills | Status: DC

## 2018-12-27 ENCOUNTER — Other Ambulatory Visit: Payer: Self-pay

## 2018-12-27 DIAGNOSIS — Z20822 Contact with and (suspected) exposure to covid-19: Secondary | ICD-10-CM

## 2018-12-28 LAB — NOVEL CORONAVIRUS, NAA: SARS-CoV-2, NAA: NOT DETECTED

## 2019-01-01 ENCOUNTER — Ambulatory Visit: Payer: BC Managed Care – PPO

## 2019-01-03 NOTE — Progress Notes (Signed)
[name] [name], [name] [address] Phone: [phone] Subjective:   [name], am serving as a scribe for Dr. Hulan Saas.     CC:   Elbow pain follow-up  [id number]   12/07/2018 Multiple joint pain, polyarthralgia, I do believe that this is more secondary to patient's weight loss.  Has been losing weight and doing well but unfortunately has been fairly severe at the moment.  We discussed icing regimen and home exercises, which activities of doing which wants to avoid.  Patient is to increase activity slowly and make the adjustments of the nutrition we discussed.  Laboratory work-up ordered as well.  Follow-up again in 4 to 8 weeks   Brett [name] is a 43 y.o. male coming in with complaint of polyarthralgia. Returned to physical activity and is having some soreness but it is less. Eating more is helping his fatigue and over muscular soreness. Is still trying to work up to training intensity that he desires to be at.   Continues to have knee and elbow pain. Notices clicking and popping. Feels that it takes a while to get these areas to warm up. Constant dull ache which increases following activity. Has a hard time getting up and down with his own weight but desires to be able to to hold someone and kneel down. Would like to be able to perform child's pose without pain.  Patient has changed diet somewhat and has noticed some improvement.  Continues to have some mild discomfort and pain but nothing that stops him from activity.       Past Medical History:  Diagnosis Date  . Asthma   . Bipolar 1 disorder ([facility]) 06/15/2016   Previously followed by Psychiatry. Hx of "anger issues." Hx of medication use has included Seroquel and Ativan, both of which have helped him in the past.   . Chronic pain of both knees 06/15/2016  . Chronic seasonal allergic rhinitis due to pollen 06/15/2016  . Depression   . Gastroesophageal reflux disease  without esophagitis 06/15/2016  . Hypertension   . Insomnia 06/15/2016  . Left rotator cuff tear arthropathy 06/15/2016   s/p repair and PT. No ongoing issues.  . Morbid obesity ([facility]) 06/15/2016   Past Surgical History:  Procedure Laterality Date  . BICEPS TENDON REPAIR     Social History   Socioeconomic History  . Marital status: Married    Spouse name: Not on file  . Number of children: 2  . Years of education: Not on file  . Highest education level: Not on file  Occupational History  . Occupation: Camera operator: [employer]  . Financial resource strain: Not on file  . Food insecurity    Worry: Not on file    Inability: Not on file  . Transportation needs    Medical: Not on file    Non-medical: Not on file  Tobacco Use  . Smoking status: Never Smoker  . Smokeless tobacco: Never Used  Substance and Sexual Activity  . Alcohol use: No  . Drug use: No  . Sexual activity: Yes  Lifestyle  . Physical activity    Days per week: Not on file    Minutes per session: Not on file  . Stress: Not on file  Relationships  . Social Herbalist on phone: Not on file    Gets together: Not on file  Attends religious service: Not on file    Active member of club or organization: Not on file    Attends meetings of clubs or organizations: Not on file    Relationship status: Not on file  Other Topics Concern  . Not on file  Social History Narrative   Lives with wife and two children, all patients of [facility]. Wants to get back to weight lifting. Lives near [place] in [place].   No Known Allergies no known drug allergies No family history on file.  No family history of autoimmune  Current Outpatient Medications (Endocrine & Metabolic):  .  liraglutide (VICTOZA) 18 MG/3ML SOPN, Inject 0.2 mLs (1.2 mg total) into the skin daily. Brett [name]  testosterone cypionate (DEPOTESTOSTERONE CYPIONATE) 200 MG/ML injection, INJECT 1 MILLILITER ONCE  WEEKLY FOR 4 WEEKS THEN 1 MILLILITER BIWEEKLY FOR 2 MONTHS  Current Outpatient Medications (Cardiovascular):  .  lisinopril (PRINIVIL,ZESTRIL) 5 MG tablet, Take 1 tablet (5 mg total) by mouth daily. .  rosuvastatin (CRESTOR) 10 MG tablet, Take 1 tablet (10 mg total) by mouth daily.  Current Outpatient Medications (Respiratory):  .  albuterol (VENTOLIN HFA) 108 (90 Base) MCG/ACT inhaler, TAKE 2 PUFFS BY MOUTH EVERY 6 HOURS AS NEEDED FOR WHEEZE OR SHORTNESS OF BREATH .  fluticasone (FLOVENT HFA) 110 MCG/ACT inhaler, Inhale 1 puff into the lungs 2 (two) times daily.  Current Outpatient Medications (Analgesics):  .  celecoxib (CELEBREX) 100 MG capsule, TAKE 1 CAPSULE (100 MG TOTAL) BY MOUTH 2 (TWO) TIMES DAILY AS NEEDED.   Current Outpatient Medications (Other):  Brett [name]  buPROPion (WELLBUTRIN XL) 150 MG 24 hr tablet, TAKE 1 TABLET (150 MG TOTAL) BY MOUTH DAILY .  Calcium Carb-Cholecalciferol (CALCIUM 1000 + D) 1000-800 MG-UNIT TABS,  .  Insulin Pen Needle (B-D UF III MINI PEN NEEDLES) 31G X 5 MM MISC, USE TO INJECT VICTOZA DAILY .  Lisdexamfetamine Dimesylate (VYVANSE) 60 MG CHEW, Chew 60 mg by mouth daily. .  Lisdexamfetamine Dimesylate (VYVANSE) 60 MG CHEW, Chew 60 mg by mouth daily. .  Lisdexamfetamine Dimesylate (VYVANSE) 60 MG CHEW, Chew 60 mg by mouth daily. .  Lisdexamfetamine Dimesylate (VYVANSE) 60 MG CHEW, Chew 1 tablet by mouth daily. Brett [name]  MAGNESIUM CITRATE PO*, Take 1 tablet by mouth every evening. .  methocarbamol (ROBAXIN-750) 750 MG tablet, Take 1 tablet (750 mg total) by mouth 3 (three) times daily. Brett [name]  omeprazole (PRILOSEC) 40 MG capsule, Take 1 capsule (40 mg total) by mouth daily. .  Potassium Gluconate 550 MG TABS,  .  pregabalin (LYRICA) 200 MG capsule, Take 1 capsule (200 mg total) by mouth 2 (two) times daily. .  SYRINGE-NEEDLE, DISP, 3 ML (B-D 3CC LUER-LOK SYR 22GX1-1/2) 22G X 1-1/2" 3 ML MISC, 1 each by Other route once a week. * These medications belong to multiple  therapeutic classes and are listed under each applicable group.    Past medical history, social, surgical and family history all reviewed in electronic medical record.  No pertanent information unless stated regarding to the chief complaint.   Review of Systems:  No headache, visual changes, nausea, vomiting, diarrhea, constipation, dizziness, abdominal pain, skin rash, fevers, chills, night sweats, weight loss, swollen lymph nodes, body aches, joint swelling, chest pain, shortness of breath, mood changes.  Mild positive muscle aches  Objective  Blood pressure 110/80, pulse 91, height 6' (1.829 m), weight 230 lb (104.3 kg), SpO2 98 %.    General: No apparent distress alert and oriented x3 mood and affect normal, dressed appropriately.  HEENT: Pupils equal, extraocular movements intact  Respiratory: Patient's speak in full sentences and does not appear short of breath  Cardiovascular: No lower extremity edema, non tender, no erythema  Skin: Warm dry intact with no signs of infection or rash on extremities or on axial skeleton.  Abdomen: Soft nontender  Neuro: Cranial nerves II through XII are intact, neurovascularly intact in all extremities with 2+ DTRs and 2+ pulses.  Lymph: No lymphadenopathy of posterior or anterior cervical chain or axillae bilaterally.  Gait normal with good balance and coordination.  MSK:  tender with full range of motion and good stability and symmetric strength and tone of shoulders, elbows, wrist, hip, and ankles bilaterally.   Patient's knees do show some mild lateral tracking of the patella's.  Right greater than left.  Mild tender to palpation of the patellofemoral joint.  Good stability of the knee though noted.  Full range of motion.   Impression and Recommendations:      The above documentation has been reviewed and is accurate and complete [name], DO       Note: This dictation was prepared with Dragon dictation along with smaller phrase  technology. Any transcriptional errors that result from this process are unintentional.

## 2019-01-04 ENCOUNTER — Other Ambulatory Visit: Payer: Self-pay

## 2019-01-04 ENCOUNTER — Ambulatory Visit: Payer: BC Managed Care – PPO | Admitting: Family Medicine

## 2019-01-04 DIAGNOSIS — G8929 Other chronic pain: Secondary | ICD-10-CM

## 2019-01-04 DIAGNOSIS — M25562 Pain in left knee: Secondary | ICD-10-CM | POA: Diagnosis not present

## 2019-01-04 DIAGNOSIS — M25561 Pain in right knee: Secondary | ICD-10-CM

## 2019-01-04 NOTE — Patient Instructions (Signed)
Exercise 3 times a week Wear a brace with activity  Try to get more carbs after working out Keep doing everything else  See me again in 6 weeks

## 2019-01-05 ENCOUNTER — Encounter: Payer: Self-pay | Admitting: Family Medicine

## 2019-01-05 NOTE — Assessment & Plan Note (Signed)
More about patellofemoral joint mild muscle instability causing this.  Patient given home exercises, discussed the potential of bracing to help for some stability with some of the exercises.  Discussed icing regimen and home exercises.  As long as patient does well follow-up again in 2 months

## 2019-01-17 ENCOUNTER — Telehealth: Payer: Self-pay | Admitting: Family Medicine

## 2019-01-17 NOTE — Telephone Encounter (Signed)
I talked to his wife about it yesterday. I think that he would do well with Dr. Jonni Sanger. Just saw that she isn't accepting under 12, though? She CAN see kids, I think that she was letting the other physicians have them since pediatric patients are wanted. Ask her. She would be getting a family of four - great patients.

## 2019-01-17 NOTE — Telephone Encounter (Signed)
Ok to make app with you?

## 2019-01-17 NOTE — Telephone Encounter (Signed)
Please advise 

## 2019-01-17 NOTE — Telephone Encounter (Signed)
Can you call family to make toc app with Dr Jonni Sanger?

## 2019-01-17 NOTE — Telephone Encounter (Signed)
Please either call patient to discuss which provider Dr. Juleen China recommends pt transfers to, or let me know and I will call pt.  He says he has a unique personality and sense of humor and wants Dr. Juleen China to recommended who would be best suited for him personally.  Thank you!

## 2019-01-17 NOTE — Telephone Encounter (Signed)
Sure. thanks 

## 2019-01-18 ENCOUNTER — Ambulatory Visit (INDEPENDENT_AMBULATORY_CARE_PROVIDER_SITE_OTHER): Payer: BC Managed Care – PPO

## 2019-01-18 ENCOUNTER — Other Ambulatory Visit: Payer: Self-pay

## 2019-01-18 ENCOUNTER — Encounter: Payer: Self-pay | Admitting: Family Medicine

## 2019-01-18 DIAGNOSIS — Z23 Encounter for immunization: Secondary | ICD-10-CM | POA: Diagnosis not present

## 2019-01-24 ENCOUNTER — Encounter: Payer: Self-pay | Admitting: Family Medicine

## 2019-01-31 ENCOUNTER — Encounter: Payer: Self-pay | Admitting: Family Medicine

## 2019-01-31 ENCOUNTER — Telehealth: Payer: Self-pay

## 2019-01-31 DIAGNOSIS — F902 Attention-deficit hyperactivity disorder, combined type: Secondary | ICD-10-CM

## 2019-01-31 NOTE — Telephone Encounter (Signed)
Patient has TOC app with you ok to fill?

## 2019-02-01 NOTE — Telephone Encounter (Signed)
Can you call for app  

## 2019-02-01 NOTE — Telephone Encounter (Signed)
Recommend virtual visit with me next week for ADD; then will do toc as scheduled

## 2019-02-04 ENCOUNTER — Encounter: Payer: Self-pay | Admitting: Family Medicine

## 2019-02-04 ENCOUNTER — Ambulatory Visit (INDEPENDENT_AMBULATORY_CARE_PROVIDER_SITE_OTHER): Payer: BC Managed Care – PPO | Admitting: Family Medicine

## 2019-02-04 DIAGNOSIS — F902 Attention-deficit hyperactivity disorder, combined type: Secondary | ICD-10-CM | POA: Diagnosis not present

## 2019-02-04 MED ORDER — LISDEXAMFETAMINE DIMESYLATE 60 MG PO CAPS: 60.0000 mg | ORAL_CAPSULE | ORAL | 0 refills | Status: DC

## 2019-02-04 NOTE — Progress Notes (Signed)
Virtual Visit via Video Note  Subjective  CC:  Chief Complaint  Patient presents with  . ADHD    needs refill     I connected with Mayme Genta on 02/04/19 at  4:20 PM EST by a video enabled telemedicine application and verified that I am speaking with the correct person using two identifiers. Location patient: Home Location provider: [facility] [place], Office Persons participating in the virtual visit: Emilian Hinnenkamp, [name], MD  Gertie Exon, [place] discussed the limitations of evaluation and management by telemedicine and the availability of in person appointments. The patient expressed understanding and agreed to proceed. HPI: Brett [name] is a 43 y.o. male who was contacted today to address the problems listed above in the chief complaint.  ADD fu: new pt to me. Chart reviewed. Pt on vyvanse. Has been out for several weeks and is struggling. 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 No AEs when he is on the medications.   He has a toc visit with me next week.   Assessment  1. Attention deficit hyperactivity disorder (ADHD), combined type      Plan   ADD:  Restart vyvanse. Will see him in the office next week and at that time will print RXs for him as he has struggled getting his RX filled on time by pt report.   I discussed the assessment and treatment plan with the patient. The patient was provided an opportunity to ask questions and all were answered. The patient agreed with the plan and demonstrated an understanding of the instructions.   The patient was advised to call back or seek an in-person evaluation if the symptoms worsen or if the condition fails to improve as anticipated. Follow up: No follow-ups on file.  02/12/2019  No orders of the defined types were placed in this encounter.     I reviewed the patients updated PMH, FH, and SocHx.     Patient Active Problem List   Diagnosis Date Noted  . Attention deficit hyperactivity disorder (ADHD), combined type 09/27/2017    Priority: High  . Type 2 diabetes mellitus with diabetic polyneuropathy, without long-term current use of insulin ([place]) 06/16/2016    Priority: High  . Bipolar 2 disorder ([place]) 06/15/2016    Priority: High  . Insomnia 06/15/2016    Priority: High  . Unilateral primary osteoarthritis, left knee 07/10/2017    Priority: Medium  . Spondylolisthesis, lumbosacral region 07/10/2017  . Chronic midline low back pain without sciatica 07/10/2017  . Multiple joint pain 05/20/2017  . Hypogonadism in male 05/20/2017  . Shoulder impingement 06/16/2016  . Hyperlipidemia associated with type 2 diabetes mellitus ([place]) 06/16/2016  . Left rotator cuff tear arthropathy, s/p PT and repair 06/15/2016  . Hypertension associated with diabetes ([place]) 06/15/2016  . Gastroesophageal reflux disease without esophagitis 06/15/2016  . Chronic seasonal allergic rhinitis due to pollen 06/15/2016  . Peripheral polyneuropathy 06/15/2016  . Morbid obesity ([place]) 06/15/2016  . Asthma    Current Meds  Medication Sig  . albuterol (VENTOLIN HFA) 108 (90 Base) MCG/ACT inhaler TAKE 2 PUFFS BY MOUTH EVERY 6 HOURS AS NEEDED FOR WHEEZE OR SHORTNESS OF BREATH  . buPROPion (WELLBUTRIN XL) 150 MG 24 hr tablet TAKE 1 TABLET (150 MG TOTAL) BY MOUTH DAILY  . Calcium Carb-Cholecalciferol (CALCIUM 1000 + D) 1000-800 MG-UNIT TABS   .  celecoxib (CELEBREX) 100 MG capsule TAKE 1 CAPSULE (100 MG TOTAL) BY MOUTH 2 (TWO) TIMES DAILY AS NEEDED.  . fluticasone (FLOVENT HFA) 110 MCG/ACT inhaler Inhale 1 puff into the lungs 2 (two) times daily.  . Insulin Pen Needle (B-D UF III MINI PEN NEEDLES) 31G X 5 MM MISC USE TO INJECT VICTOZA DAILY  . liraglutide (VICTOZA) 18 MG/3ML SOPN Inject 0.2 mLs (1.2 mg total) into the skin daily.  . Lisdexamfetamine Dimesylate (VYVANSE) 60 MG CHEW Chew 60 mg by mouth daily.  Marland Kitchen  lisinopril (PRINIVIL,ZESTRIL) 5 MG tablet Take 1 tablet (5 mg total) by mouth daily.  Marland Kitchen MAGNESIUM CITRATE PO Take 1 tablet by mouth every evening.  . methocarbamol (ROBAXIN-750) 750 MG tablet Take 1 tablet (750 mg total) by mouth 3 (three) times daily.  Marland Kitchen omeprazole (PRILOSEC) 40 MG capsule Take 1 capsule (40 mg total) by mouth daily.  . Potassium Gluconate 550 MG TABS   . pregabalin (LYRICA) 200 MG capsule Take 1 capsule (200 mg total) by mouth 2 (two) times daily.  . rosuvastatin (CRESTOR) 10 MG tablet Take 1 tablet (10 mg total) by mouth daily.  . SYRINGE-NEEDLE, DISP, 3 ML (B-D 3CC LUER-LOK SYR 22GX1-1/2) 22G X 1-1/2" 3 ML MISC 1 each by Other route once a week.  . testosterone cypionate (DEPOTESTOSTERONE CYPIONATE) 200 MG/ML injection INJECT 1 MILLILITER ONCE WEEKLY FOR 4 WEEKS THEN 1 MILLILITER BIWEEKLY FOR 2 MONTHS  . [DISCONTINUED] Lisdexamfetamine Dimesylate (VYVANSE) 60 MG CHEW Chew 60 mg by mouth daily.  . [DISCONTINUED] Lisdexamfetamine Dimesylate (VYVANSE) 60 MG CHEW Chew 60 mg by mouth daily.  . [DISCONTINUED] Lisdexamfetamine Dimesylate (VYVANSE) 60 MG CHEW Chew 1 tablet by mouth daily.    Allergies: Patient has No Known Allergies. Family History: Patient family history is not on file. Social History:  Patient  reports that he has never smoked. He has never used smokeless tobacco. He reports that he does not drink alcohol or use drugs.  Review of Systems: Constitutional: Negative for fever malaise or anorexia Cardiovascular: negative for chest pain Respiratory: negative for SOB or persistent cough Gastrointestinal: negative for abdominal pain  OBJECTIVE Vitals: There were no vitals taken for this visit. General: no acute distress , A&Ox3  [name], MD

## 2019-02-12 ENCOUNTER — Ambulatory Visit: Payer: BC Managed Care – PPO | Admitting: Family Medicine

## 2019-02-12 ENCOUNTER — Encounter: Payer: Self-pay | Admitting: Family Medicine

## 2019-02-12 ENCOUNTER — Other Ambulatory Visit: Payer: Self-pay

## 2019-02-12 VITALS — BP 124/78 | HR 75 | Temp 98.6°F | Ht 72.0 in | Wt 226.0 lb

## 2019-02-12 DIAGNOSIS — F902 Attention-deficit hyperactivity disorder, combined type: Secondary | ICD-10-CM

## 2019-02-12 DIAGNOSIS — G629 Polyneuropathy, unspecified: Secondary | ICD-10-CM

## 2019-02-12 DIAGNOSIS — M545 Low back pain: Secondary | ICD-10-CM | POA: Diagnosis not present

## 2019-02-12 DIAGNOSIS — I10 Essential (primary) hypertension: Secondary | ICD-10-CM

## 2019-02-12 DIAGNOSIS — E1142 Type 2 diabetes mellitus with diabetic polyneuropathy: Secondary | ICD-10-CM | POA: Diagnosis not present

## 2019-02-12 DIAGNOSIS — M2550 Pain in unspecified joint: Secondary | ICD-10-CM

## 2019-02-12 DIAGNOSIS — E291 Testicular hypofunction: Secondary | ICD-10-CM

## 2019-02-12 DIAGNOSIS — I152 Hypertension secondary to endocrine disorders: Secondary | ICD-10-CM

## 2019-02-12 DIAGNOSIS — G8929 Other chronic pain: Secondary | ICD-10-CM

## 2019-02-12 DIAGNOSIS — E1159 Type 2 diabetes mellitus with other circulatory complications: Secondary | ICD-10-CM

## 2019-02-12 LAB — POCT GLYCOSYLATED HEMOGLOBIN (HGB A1C): Hemoglobin A1C: 5 % (ref 4.0–5.6)

## 2019-02-12 MED ORDER — LISDEXAMFETAMINE DIMESYLATE 60 MG PO CAPS: 60.0000 mg | ORAL_CAPSULE | ORAL | 0 refills | Status: DC

## 2019-02-12 MED ORDER — METHOCARBAMOL 750 MG PO TABS: 750.0000 mg | ORAL_TABLET | Freq: Three times a day (TID) | ORAL | 3 refills | Status: DC | PRN

## 2019-02-12 NOTE — Patient Instructions (Signed)
Please return in 3 months for a complete physical with fasting lab work.   I will check your testosterone levels and cholesterol levels at your next appointment. Stop the victoza and we will see if you have resolved your diabetes at next visit.   If you have any questions or concerns, please don't hesitate to send me a message via MyChart or call the office at [phone]. Thank you for visiting with Brett [name] today! It's our pleasure caring for you.

## 2019-02-12 NOTE — Progress Notes (Signed)
Subjective  CC:  Chief Complaint  Patient presents with  . Overall Joint Pain  . Muscle Pain  . Medication Refill    Robaxin  . Transitions Of Care    HPI: Brett [name] is a 43 y.o. male who presents to [place] at [facility] today to establish care with me as a new patient.  I have reviewed his chart in detail .  He has the following concerns or needs:  ADD: now controlled back on vyvanse. No AEs.   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 Always moving. Seeing SM. On celebrex and robaxin daily.   HTN on Ace and well controlled w/o known CAD. No cp or sob  HLD on statin. Last checked > 1 year ago  Low T on injectable testosterone. Feels well. Would like it to be even higher but did have complications of increased hgb and bilirubin in past. Due for recheck. Takes injections twice monthly  Diabetes follow up: His diabetic control is reported as excellent.. Diagnosed 2 years ago: since has completely changed lifestyle. Lost weight and is active now. Eats well.  He denies exertional CP or SOB or symptomatic hypoglycemia. He denies foot sores or paresthesias. On victoza. Due eye exam. + peripheral neuropathy on lyrica. ? Diabetic.  Immunization History  Administered Date(s) Administered  . Influenza,inj,Quad PF,6+ Mos 12/19/2016, 02/16/2018, 01/18/2019  . Pneumococcal Polysaccharide-23 09/16/2016  . Tdap 05/18/2017    Diabetes Related Lab Review: Lab Results  Component Value Date   HGBA1C 5.0 02/12/2019   HGBA1C 5.4 08/01/2018   HGBA1C 5.0 09/27/2017    No results found for: Derl Barrow Lab Results  Component Value Date   CREATININE 0.87 09/19/2018   BUN 11 09/19/2018   NA 139 09/19/2018   K 5.0 09/19/2018   CL 101 09/19/2018   CO2 30 09/19/2018   Lab Results  Component Value Date   CHOL 106 09/27/2017   CHOL 150 12/19/2016   CHOL 213 (H) 06/15/2016   Lab Results  Component Value Date   HDL 31.80 (L)  09/27/2017   HDL 38.50 (L) 12/19/2016   HDL 40.90 06/15/2016   Lab Results  Component Value Date   LDLCALC 54 09/27/2017   LDLCALC 84 12/19/2016   LDLCALC 133 (H) 06/15/2016   Lab Results  Component Value Date   TRIG 104.0 09/27/2017   TRIG 137.0 12/19/2016   TRIG 198.0 (H) 06/15/2016   Lab Results  Component Value Date   CHOLHDL 3 09/27/2017   CHOLHDL 4 12/19/2016   CHOLHDL 5 06/15/2016   No results found for: LDLDIRECT The ASCVD Risk score Mikey Bussing DC Jr., et al., 2013) failed to calculate for the following reasons:   The valid total cholesterol range is 130 to 320 mg/dL  BP Readings from Last 3 Encounters:  02/12/19 124/78  01/04/19 110/80  12/07/18 120/68   Wt Readings from Last 3 Encounters:  02/12/19 256 lb (116.1 kg)  01/04/19 230 lb (104.3 kg)  12/07/18 229 lb (103.9 kg)    Health Maintenance  Topic Date Due  . FOOT EXAM  09/28/2018  . OPHTHALMOLOGY EXAM  11/09/2018  . HEMOGLOBIN A1C  02/01/2019  . TETANUS/TDAP  05/19/2027  . INFLUENZA VACCINE  Completed  . PNEUMOCOCCAL POLYSACCHARIDE VACCINE AGE 59-64 HIGH RISK  Completed  . HIV Screening  Completed    Assessment  1. Type 2 diabetes mellitus with diabetic polyneuropathy, without long-term current use of insulin ([place])   2. Chronic midline  low back pain without sciatica   3. Morbid obesity ([place])   4. Attention deficit hyperactivity disorder (ADHD), combined type   5. Hypertension associated with diabetes ([place])   6. Peripheral polyneuropathy   7. Hypogonadism in male   8. Multiple joint pain      Plan   DM: normal a1c x 1 year. Will stop victoza, continue healthy lifestyle and recheck in 3 months. May have resolved it with healthy diet. Needs eye exam. imms up to date  HTN is controlled on ace  Neuropathy controlled on lyrica.  Chronic aches and pain: per sm. Refilled robaxin  ADD: refilled vyvanse x 3 months. Printed RXs  Low T: need to recheck at next visit.   Follow up:  Return in about 3  months (around 05/15/2019) for complete physical, follow up of diabetes and hypertension. Orders Placed This Encounter  Procedures  . A1C POCT   Meds ordered this encounter  Medications  . methocarbamol (ROBAXIN-750) 750 MG tablet    Sig: Take 1 tablet (750 mg total) by mouth 3 (three) times daily as needed for muscle spasms.    Dispense:  90 tablet    Refill:  3  . DISCONTD: lisdexamfetamine (VYVANSE) 60 MG capsule    Sig: Take 1 capsule (60 mg total) by mouth every morning.    Dispense:  30 capsule    Refill:  0  . DISCONTD: lisdexamfetamine (VYVANSE) 60 MG capsule    Sig: Take 1 capsule (60 mg total) by mouth every morning.    Dispense:  30 capsule    Refill:  0  . lisdexamfetamine (VYVANSE) 60 MG capsule    Sig: Take 1 capsule (60 mg total) by mouth every morning.    Dispense:  30 capsule    Refill:  0     Depression screen [facility] 2/9 03/16/2018 12/13/2017 09/27/2017 07/29/2016 06/15/2016  Decreased Interest 3 1 0 0 0  Down, Depressed, Hopeless 1 2 0 0 0  PHQ - 2 Score 4 3 0 0 0  Altered sleeping 3 3 0 - -  Tired, decreased energy 3 3 0 - -  Change in appetite 1 2 0 - -  Feeling bad or failure about yourself  0 2 0 - -  Trouble concentrating 0 2 1 - -  Moving slowly or fidgety/restless 0 2 0 - -  Suicidal thoughts 0 0 0 - -  PHQ-9 Score 11 17 1  - -  Difficult doing work/chores Somewhat difficult Somewhat difficult Not difficult at all - -    We updated and reviewed the patient's past history in detail and it is documented below.  Patient Active Problem List   Diagnosis Date Noted  . Attention deficit hyperactivity disorder (ADHD), combined type 09/27/2017    Priority: High  . Hyperlipidemia associated with type 2 diabetes mellitus ([place]) 06/16/2016    Priority: High  . Type 2 diabetes mellitus with diabetic polyneuropathy, without long-term current use of insulin ([place]) 06/16/2016    Priority: High  . Bipolar 2 disorder ([place]) 06/15/2016    Priority: High    Previously  followed by Psychiatry. Hx of "anger issues."   . Hypertension associated with diabetes ([place]) 06/15/2016    Priority: High  . Peripheral polyneuropathy 06/15/2016    Priority: High  . Insomnia 06/15/2016    Priority: High  . Unilateral primary osteoarthritis, left knee 07/10/2017    Priority: Medium  . Chronic midline low back pain without sciatica 07/10/2017    Priority:  Medium  . Hypogonadism in male 05/20/2017    Priority: Medium  . Gastroesophageal reflux disease without esophagitis 06/15/2016    Priority: Medium  . Moderate persistent asthma     Priority: Medium  . Spondylolisthesis, lumbosacral region 07/10/2017    Priority: Low  . Left rotator cuff tear arthropathy, s/p PT and repair 06/15/2016    Priority: Low  . Chronic seasonal allergic rhinitis due to pollen 06/15/2016    Priority: Low   Health Maintenance  Topic Date Due  . FOOT EXAM  09/28/2018  . OPHTHALMOLOGY EXAM  11/09/2018  . HEMOGLOBIN A1C  02/01/2019  . TETANUS/TDAP  05/19/2027  . INFLUENZA VACCINE  Completed  . PNEUMOCOCCAL POLYSACCHARIDE VACCINE AGE 32-64 HIGH RISK  Completed  . HIV Screening  Completed   Immunization History  Administered Date(s) Administered  . Influenza,inj,Quad PF,6+ Mos 12/19/2016, 02/16/2018, 01/18/2019  . Pneumococcal Polysaccharide-23 09/16/2016  . Tdap 05/18/2017   Current Meds  Medication Sig  . albuterol (VENTOLIN HFA) 108 (90 Base) MCG/ACT inhaler TAKE 2 PUFFS BY MOUTH EVERY 6 HOURS AS NEEDED FOR WHEEZE OR SHORTNESS OF BREATH  . buPROPion (WELLBUTRIN XL) 150 MG 24 hr tablet TAKE 1 TABLET (150 MG TOTAL) BY MOUTH DAILY  . Calcium Carb-Cholecalciferol (CALCIUM 1000 + D) 1000-800 MG-UNIT TABS   . celecoxib (CELEBREX) 100 MG capsule TAKE 1 CAPSULE (100 MG TOTAL) BY MOUTH 2 (TWO) TIMES DAILY AS NEEDED.  . cyanocobalamin 1000 MCG tablet Take 1,000 mcg by mouth daily.  . fluticasone (FLOVENT HFA) 110 MCG/ACT inhaler Inhale 1 puff into the lungs 2 (two) times daily.  Derrill Memo  ON 04/29/2019] lisdexamfetamine (VYVANSE) 60 MG capsule Take 1 capsule (60 mg total) by mouth every morning.  Marland Kitchen lisinopril (PRINIVIL,ZESTRIL) 5 MG tablet Take 1 tablet (5 mg total) by mouth daily.  Marland Kitchen MAGNESIUM CITRATE PO Take 1 tablet by mouth every evening.  . methocarbamol (ROBAXIN-750) 750 MG tablet Take 1 tablet (750 mg total) by mouth 3 (three) times daily as needed for muscle spasms.  Marland Kitchen omeprazole (PRILOSEC) 40 MG capsule Take 1 capsule (40 mg total) by mouth daily.  . Potassium Gluconate 550 MG TABS   . Prasterone, DHEA, (DHEA 50) 50 MG CAPS Take by mouth.  . pregabalin (LYRICA) 200 MG capsule Take 1 capsule (200 mg total) by mouth 2 (two) times daily.  . rosuvastatin (CRESTOR) 10 MG tablet Take 1 tablet (10 mg total) by mouth daily.  . SYRINGE-NEEDLE, DISP, 3 ML (B-D 3CC LUER-LOK SYR 22GX1-1/2) 22G X 1-1/2" 3 ML MISC 1 each by Other route once a week.  . testosterone cypionate (DEPOTESTOSTERONE CYPIONATE) 200 MG/ML injection INJECT 1 MILLILITER ONCE WEEKLY FOR 4 WEEKS THEN 1 MILLILITER BIWEEKLY FOR 2 MONTHS  . [DISCONTINUED] Insulin Pen Needle (B-D UF III MINI PEN NEEDLES) 31G X 5 MM MISC USE TO INJECT VICTOZA DAILY  . [DISCONTINUED] liraglutide (VICTOZA) 18 MG/3ML SOPN Inject 0.2 mLs (1.2 mg total) into the skin daily.  . [DISCONTINUED] lisdexamfetamine (VYVANSE) 60 MG capsule Take 1 capsule (60 mg total) by mouth every morning.  . [DISCONTINUED] lisdexamfetamine (VYVANSE) 60 MG capsule Take 1 capsule (60 mg total) by mouth every morning.  . [DISCONTINUED] lisdexamfetamine (VYVANSE) 60 MG capsule Take 1 capsule (60 mg total) by mouth every morning.  . [DISCONTINUED] methocarbamol (ROBAXIN-750) 750 MG tablet Take 1 tablet (750 mg total) by mouth 3 (three) times daily.    Allergies: Patient has No Known Allergies. Past Medical History Patient  has a past medical history of  Asthma, Bipolar 1 disorder ([place]) (06/15/2016), Chronic pain of both knees (06/15/2016), Chronic seasonal allergic  rhinitis due to pollen (06/15/2016), Depression, Gastroesophageal reflux disease without esophagitis (06/15/2016), Hypertension, Insomnia (06/15/2016), Left rotator cuff tear arthropathy (06/15/2016), and Morbid obesity ([place]) (06/15/2016). Past Surgical History Patient  has a past surgical history that includes Biceps tendon repair. Family History: Patient family history is not on file. Social History:  Patient  reports that he has never smoked. He has never used smokeless tobacco. He reports that he does not drink alcohol or use drugs.  Review of Systems: Constitutional: negative for fever or malaise Ophthalmic: negative for photophobia, double vision or loss of vision Cardiovascular: negative for chest pain, dyspnea on exertion, or new LE swelling Respiratory: negative for SOB or persistent cough Gastrointestinal: negative for abdominal pain, change in bowel habits or melena Genitourinary: negative for dysuria or gross hematuria Musculoskeletal: negative for new gait disturbance or muscular weakness Integumentary: negative for new or persistent rashes Neurological: negative for TIA or stroke symptoms Psychiatric: negative for SI or delusions Allergic/Immunologic: negative for hives  Patient Care Team    Relationship Specialty Notifications Start End  [name], MD PCP - General Family Medicine  02/04/19   Newt Minion, MD Consulting Physician Orthopedic Surgery  09/27/17   Gerda Diss, DO Consulting Physician Family Medicine  09/27/17   Curt Jews, OD  Optometry  11/14/17     Objective  Vitals: BP 124/78 (BP Location: Left Arm, Patient Position: Sitting, Cuff Size: Normal)   Pulse 75   Temp 98.6 F (37 C) (Temporal)   Ht 6' (1.829 m)   Wt 256 lb (116.1 kg)   SpO2 97%   BMI 34.72 kg/m  General:  Well developed, well nourished, no acute distress , increase mm bulk Psych:  Alert and oriented,normal mood and affect HEENT:  Normocephalic, atraumatic, non-icteric sclera,  PERRL, oropharynx is without mass or exudate, supple neck without adenopathy, mass or thyromegaly Cardiovascular:  RRR without gallop, rub or murmur, nondisplaced PMI Respiratory:  Good breath sounds bilaterally, CTAB with normal respiratory effort Gastrointestinal: normal bowel sounds, soft, non-tender, no noted masses. No HSM Diabetic Foot Exam: Appearance - no lesions, ulcers or calluses Skin - no sigificant pallor or erythema Monofilament testing - sensitive bilaterally in following locations:  Right - Great toe, medial, central, lateral ball and posterior foot intact  Left - Great toe, medial, central, lateral ball and posterior foot intact Pulses - +2 distally bilaterally   Commons side effects, risks, benefits, and alternatives for medications and treatment plan prescribed today were discussed, and the patient expressed understanding of the given instructions. Patient is instructed to call or message via MyChart if he/she has any questions or concerns regarding our treatment plan. No barriers to understanding were identified. We discussed Red Flag symptoms and signs in detail. Patient expressed understanding regarding what to do in case of urgent or emergency type symptoms.   Medication list was reconciled, printed and provided to the patient in AVS. Patient instructions and summary information was reviewed with the patient as documented in the AVS. This note was prepared with assistance of Dragon voice recognition software. Occasional wrong-word or sound-a-like substitutions may have occurred due to the inherent limitations of voice recognition software

## 2019-02-14 ENCOUNTER — Encounter: Payer: Self-pay | Admitting: Family Medicine

## 2019-02-14 NOTE — Progress Notes (Signed)
[name] [place], [place] [address] Phone: [phone] Subjective:   I Brett [name] am serving as a Education administrator for Dr. Hulan Saas.  This visit occurred during the SARS-CoV-2 public health emergency.  Safety protocols were in place, including screening questions prior to the visit, additional usage of staff PPE, and extensive cleaning of exam room while observing appropriate contact time as indicated for disinfecting solutions.     CC: Knee pain follow-up, polyvinyl vaginal follow-up  [id number]   01/04/2019 More about patellofemoral joint mild muscle instability causing this.  Patient given home exercises, discussed the potential of bracing to help for some stability with some of the exercises.  Discussed icing regimen and home exercises.  As long as patient does well follow-up again in 2 months  02/15/2019 Brett [name] is a 43 y.o. male coming in with complaint of bilateral knee pain. Knees feel about the same. Energy wise he is feeling good.  Patient feels like he has made significant progress with his energy but states that the knee pain is still there.  Not stopping him from any activities.  Still staying very active and working out on almost every day.     Past Medical History:  Diagnosis Date  . Asthma   . Bipolar 1 disorder ([place]) 06/15/2016   Previously followed by Psychiatry. Hx of "anger issues." Hx of medication use has included Seroquel and Ativan, both of which have helped him in the past.   . Chronic pain of both knees 06/15/2016  . Chronic seasonal allergic rhinitis due to pollen 06/15/2016  . Depression   . Gastroesophageal reflux disease without esophagitis 06/15/2016  . Hypertension   . Insomnia 06/15/2016  . Left rotator cuff tear arthropathy 06/15/2016   s/p repair and PT. No ongoing issues.  . Morbid obesity ([place]) 06/15/2016   Past Surgical History:  Procedure Laterality Date  . BICEPS TENDON REPAIR      Social History   Socioeconomic History  . Marital status: Married    Spouse name: Not on file  . Number of children: 2  . Years of education: Not on file  . Highest education level: Not on file  Occupational History  . Occupation: Camera operator: [employer]  . Financial resource strain: Not on file  . Food insecurity    Worry: Not on file    Inability: Not on file  . Transportation needs    Medical: Not on file    Non-medical: Not on file  Tobacco Use  . Smoking status: Never Smoker  . Smokeless tobacco: Never Used  Substance and Sexual Activity  . Alcohol use: No  . Drug use: No  . Sexual activity: Yes  Lifestyle  . Physical activity    Days per week: Not on file    Minutes per session: Not on file  . Stress: Not on file  Relationships  . Social Herbalist on phone: Not on file    Gets together: Not on file    Attends religious service: Not on file    Active member of club or organization: Not on file    Attends meetings of clubs or organizations: Not on file    Relationship status: Not on file  Other Topics Concern  . Not on file  Social History Narrative   Lives with wife and two children, all patients of [facility]. Wants to get  back to weight lifting. Lives near [place] in [place].   No Known Allergies No family history on file.  Current Outpatient Medications (Endocrine & Metabolic):  .  testosterone cypionate (DEPOTESTOSTERONE CYPIONATE) 200 MG/ML injection, INJECT 1 MILLILITER ONCE WEEKLY FOR 4 WEEKS THEN 1 MILLILITER BIWEEKLY FOR 2 MONTHS  Current Outpatient Medications (Cardiovascular):  .  lisinopril (PRINIVIL,ZESTRIL) 5 MG tablet, Take 1 tablet (5 mg total) by mouth daily. .  rosuvastatin (CRESTOR) 10 MG tablet, Take 1 tablet (10 mg total) by mouth daily.  Current Outpatient Medications (Respiratory):  .  albuterol (VENTOLIN HFA) 108 (90 Base) MCG/ACT inhaler, TAKE 2 PUFFS BY MOUTH EVERY 6  HOURS AS NEEDED FOR WHEEZE OR SHORTNESS OF BREATH .  fluticasone (FLOVENT HFA) 110 MCG/ACT inhaler, Inhale 1 puff into the lungs 2 (two) times daily.  Current Outpatient Medications (Analgesics):  .  celecoxib (CELEBREX) 100 MG capsule, TAKE 1 CAPSULE (100 MG TOTAL) BY MOUTH 2 (TWO) TIMES DAILY AS NEEDED.  Current Outpatient Medications (Hematological):  .  cyanocobalamin 1000 MCG tablet, Take 1,000 mcg by mouth daily.  Current Outpatient Medications (Other):  Marland Kitchen  buPROPion (WELLBUTRIN XL) 150 MG 24 hr tablet, TAKE 1 TABLET (150 MG TOTAL) BY MOUTH DAILY .  Calcium Carb-Cholecalciferol (CALCIUM 1000 + D) 1000-800 MG-UNIT TABS,  .  [START ON 04/29/2019] lisdexamfetamine (VYVANSE) 60 MG capsule, Take 1 capsule (60 mg total) by mouth every morning. Marland Kitchen  MAGNESIUM CITRATE PO*, Take 1 tablet by mouth every evening. .  methocarbamol (ROBAXIN-750) 750 MG tablet, Take 1 tablet (750 mg total) by mouth 3 (three) times daily as needed for muscle spasms. Marland Kitchen  omeprazole (PRILOSEC) 40 MG capsule, Take 1 capsule (40 mg total) by mouth daily. .  Potassium Gluconate 550 MG TABS,  .  Prasterone, DHEA, (DHEA 50) 50 MG CAPS, Take by mouth. .  pregabalin (LYRICA) 200 MG capsule, Take 1 capsule (200 mg total) by mouth 2 (two) times daily. .  SYRINGE-NEEDLE, DISP, 3 ML (B-D 3CC LUER-LOK SYR 22GX1-1/2) 22G X 1-1/2" 3 ML MISC, 1 each by Other route once a week. * These medications belong to multiple therapeutic classes and are listed under each applicable group.    Past medical history, social, surgical and family history all reviewed in electronic medical record.  No pertanent information unless stated regarding to the chief complaint.   Review of Systems:  No headache, visual changes, nausea, vomiting, diarrhea, constipation, dizziness, abdominal pain, skin rash, fevers, chills, night sweats, weight loss, swollen lymph nodes, body aches, joint swelling,  chest pain, shortness of breath, mood changes.  Positive muscle  aches  Objective  Blood pressure 122/70, pulse 75, height 6' (1.829 m), weight 230 lb (104.3 kg), SpO2 98 %.    General: No apparent distress alert and oriented x3 mood and affect normal, dressed appropriately.  HEENT: Pupils equal, extraocular movements intact  Respiratory: Patient's speak in full sentences and does not appear short of breath  Cardiovascular: No lower extremity edema, non tender, no erythema  Skin: Warm dry intact with no signs of infection or rash on extremities or on axial skeleton.  Abdomen: Soft nontender  Neuro: Cranial nerves II through XII are intact, neurovascularly intact in all extremities with 2+ DTRs and 2+ pulses.  Lymph: No lymphadenopathy of posterior or anterior cervical chain or axillae bilaterally.  Gait normal with good balance and coordination.  MSK:  Non tender with full range of motion and good stability and symmetric strength and tone of shoulders, elbows, wrist,  hip and ankles bilaterally.  Knee exam bilaterally does show some lateral tracking of the patella.  Mild patella alta bilaterally on the right greater than left.  Patient does not have any true instability.    Impression and Recommendations:     This case required medical decision making of moderate complexity. The above documentation has been reviewed and is accurate and complete Brett Pulley, DO       Note: This dictation was prepared with Dragon dictation along with smaller phrase technology. Any transcriptional errors that result from this process are unintentional.

## 2019-02-15 ENCOUNTER — Ambulatory Visit: Payer: BC Managed Care – PPO | Admitting: Family Medicine

## 2019-02-15 ENCOUNTER — Other Ambulatory Visit: Payer: Self-pay

## 2019-02-15 ENCOUNTER — Encounter: Payer: Self-pay | Admitting: Family Medicine

## 2019-02-15 DIAGNOSIS — M1712 Unilateral primary osteoarthritis, left knee: Secondary | ICD-10-CM | POA: Diagnosis not present

## 2019-02-15 NOTE — Assessment & Plan Note (Signed)
Patient has known arthritic changes.  Patient notes doing relatively well with conservative therapy.  We discussed with patient about other over-the-counter medications or even prescription medications.  Discussed with patient about diet and exercise and the importance of recovery.  Patient will continue with conservative therapy and follow-up with me again in 2 to 3 months.  Spent  25 minutes with patient face-to-face and had greater than 50% of counseling including as described above in assessment and plan.

## 2019-02-15 NOTE — Patient Instructions (Signed)
Good to see you  Exercise 3 times a week Patellar strap on the knee cap  See me again in 2 months

## 2019-02-18 ENCOUNTER — Other Ambulatory Visit: Payer: BC Managed Care – PPO

## 2019-02-28 ENCOUNTER — Other Ambulatory Visit: Payer: Self-pay

## 2019-02-28 ENCOUNTER — Encounter: Payer: Self-pay | Admitting: Family Medicine

## 2019-02-28 ENCOUNTER — Telehealth: Payer: Self-pay

## 2019-02-28 MED ORDER — SAXENDA 18 MG/3ML ~~LOC~~ SOPN: PEN_INJECTOR | SUBCUTANEOUS | 2 refills | Status: AC

## 2019-02-28 MED ORDER — INSULIN PEN NEEDLE 32G X 4 MM MISC: 3 refills | Status: DC

## 2019-02-28 NOTE — Telephone Encounter (Signed)
[place] PA approved through 06/29/2019.  Patient's pharmacy notified.

## 2019-03-19 ENCOUNTER — Other Ambulatory Visit: Payer: Self-pay | Admitting: Family Medicine

## 2019-03-19 DIAGNOSIS — J4530 Mild persistent asthma, uncomplicated: Secondary | ICD-10-CM

## 2019-03-29 ENCOUNTER — Other Ambulatory Visit: Payer: Self-pay | Admitting: Family Medicine

## 2019-03-29 DIAGNOSIS — F3181 Bipolar II disorder: Secondary | ICD-10-CM

## 2019-04-04 ENCOUNTER — Other Ambulatory Visit: Payer: Self-pay

## 2019-04-04 DIAGNOSIS — M2550 Pain in unspecified joint: Secondary | ICD-10-CM

## 2019-04-04 MED ORDER — CELECOXIB 100 MG PO CAPS: 100.0000 mg | ORAL_CAPSULE | Freq: Two times a day (BID) | ORAL | 1 refills | Status: DC | PRN

## 2019-04-05 ENCOUNTER — Encounter: Payer: Self-pay | Admitting: Family Medicine

## 2019-04-08 ENCOUNTER — Other Ambulatory Visit: Payer: Self-pay

## 2019-04-08 DIAGNOSIS — F3181 Bipolar II disorder: Secondary | ICD-10-CM

## 2019-04-08 DIAGNOSIS — E291 Testicular hypofunction: Secondary | ICD-10-CM

## 2019-04-08 MED ORDER — BUPROPION HCL ER (XL) 150 MG PO TB24: ORAL_TABLET | ORAL | 1 refills | Status: DC

## 2019-04-08 MED ORDER — "BD LUER-LOK SYRINGE 22G X 1-1/2"" 3 ML MISC": 1.0000 | 12 refills | Status: DC

## 2019-04-16 ENCOUNTER — Encounter: Payer: Self-pay | Admitting: Family Medicine

## 2019-04-16 ENCOUNTER — Ambulatory Visit: Payer: BC Managed Care – PPO | Admitting: Family Medicine

## 2019-04-16 ENCOUNTER — Other Ambulatory Visit: Payer: Self-pay

## 2019-04-16 DIAGNOSIS — M1712 Unilateral primary osteoarthritis, left knee: Secondary | ICD-10-CM

## 2019-04-16 NOTE — Progress Notes (Signed)
[facility] [address] [place] Phone: [phone] Subjective:   I Brett [name] am serving as a Education administrator for Dr. Hulan Saas.  This visit occurred during the SARS-CoV-2 public health emergency.  Safety protocols were in place, including screening questions prior to the visit, additional usage of staff PPE, and extensive cleaning of exam room while observing appropriate contact time as indicated for disinfecting solutions.   I'm seeing this patient by the request  of:  [name], MD  CC: Knee pain all over pain follow-up  [id number]   02/15/2019 Patient has known arthritic changes.  Patient notes doing relatively well with conservative therapy.  We discussed with patient about other over-the-counter medications or even prescription medications.  Discussed with patient about diet and exercise and the importance of recovery.  Patient will continue with conservative therapy and follow-up with me again in 2 to 3 months.  Spent  25 minutes with patient face-to-face and had greater than 50% of counseling including as described above in assessment and plan.  04/16/2019 Read Brett [name] is a 44 y.o. male coming in with complaint of left knee pain. Patient states he is doing well. Stretching and exercising regularly. Not 100% but the pain is better than it was.  Patient states no longer having the cramping that he was having previously.  Patient feels like doing much better overall.      Past Medical History:  Diagnosis Date  . Asthma   . Bipolar 1 disorder ([place]) 06/15/2016   Previously followed by Psychiatry. Hx of "anger issues." Hx of medication use has included Seroquel and Ativan, both of which have helped him in the past.   . Chronic pain of both knees 06/15/2016  . Chronic seasonal allergic rhinitis due to pollen 06/15/2016  . Depression   . Gastroesophageal reflux disease without esophagitis 06/15/2016  . Hypertension   .  Insomnia 06/15/2016  . Left rotator cuff tear arthropathy 06/15/2016   s/p repair and PT. No ongoing issues.  . Morbid obesity ([place]) 06/15/2016   Past Surgical History:  Procedure Laterality Date  . BICEPS TENDON REPAIR     Social History   Socioeconomic History  . Marital status: Married    Spouse name: Not on file  . Number of children: 2  . Years of education: Not on file  . Highest education level: Not on file  Occupational History  . Occupation: Camera operator: [employer]  Tobacco Use  . Smoking status: Never Smoker  . Smokeless tobacco: Never Used  Substance and Sexual Activity  . Alcohol use: No  . Drug use: No  . Sexual activity: Yes  Other Topics Concern  . Not on file  Social History Narrative   Lives with wife and two children, all patients of [facility]. Wants to get back to weight lifting. Lives near [place] in [place].   Social Determinants of Health   Financial Resource Strain:   . Difficulty of Paying Living Expenses: Not on file  Food Insecurity:   . Worried About Charity fundraiser in the Last Year: Not on file  . Ran Out of Food in the Last Year: Not on file  Transportation Needs:   . Lack of Transportation (Medical): Not on file  . Lack of Transportation (Non-Medical): Not on file  Physical Activity:   . Days of Exercise per Week: Not on file  . Minutes of Exercise per  Session: Not on file  Stress:   . Feeling of Stress : Not on file  Social Connections:   . Frequency of Communication with Friends and Family: Not on file  . Frequency of Social Gatherings with Friends and Family: Not on file  . Attends Religious Services: Not on file  . Active Member of Clubs or Organizations: Not on file  . Attends Archivist Meetings: Not on file  . Marital Status: Not on file   No Known Allergies No family history on file.  Current Outpatient Medications (Endocrine & Metabolic):  .  testosterone cypionate  (DEPOTESTOSTERONE CYPIONATE) 200 MG/ML injection, INJECT 1 MILLILITER ONCE WEEKLY FOR 4 WEEKS THEN 1 MILLILITER BIWEEKLY FOR 2 MONTHS  Current Outpatient Medications (Cardiovascular):  .  lisinopril (PRINIVIL,ZESTRIL) 5 MG tablet, Take 1 tablet (5 mg total) by mouth daily. .  rosuvastatin (CRESTOR) 10 MG tablet, Take 1 tablet (10 mg total) by mouth daily.  Current Outpatient Medications (Respiratory):  .  albuterol (VENTOLIN HFA) 108 (90 Base) MCG/ACT inhaler, INHALE 2 PUFFS BY MOUTH EVERY 6 HOURS AS NEEDED FOR WHEEZE OR SHORTNESS OF BREATH .  fluticasone (FLOVENT HFA) 110 MCG/ACT inhaler, Inhale 1 puff into the lungs 2 (two) times daily.  Current Outpatient Medications (Analgesics):  .  celecoxib (CELEBREX) 100 MG capsule, Take 1 capsule (100 mg total) by mouth 2 (two) times daily as needed.  Current Outpatient Medications (Hematological):  .  cyanocobalamin 1000 MCG tablet, Take 1,000 mcg by mouth daily.  Current Outpatient Medications (Other):  Marland Kitchen  buPROPion (WELLBUTRIN XL) 150 MG 24 hr tablet, TAKE 1 TABLET (150 MG TOTAL) BY MOUTH DAILY .  Calcium Carb-Cholecalciferol (CALCIUM 1000 + D) 1000-800 MG-UNIT TABS,  .  Insulin Pen Needle 32G X 4 MM MISC, Use 1 needle per day as directed .  [START ON 04/29/2019] lisdexamfetamine (VYVANSE) 60 MG capsule, Take 1 capsule (60 mg total) by mouth every morning. Marland Kitchen  MAGNESIUM CITRATE PO*, Take 1 tablet by mouth every evening. .  methocarbamol (ROBAXIN-750) 750 MG tablet, Take 1 tablet (750 mg total) by mouth 3 (three) times daily as needed for muscle spasms. Marland Kitchen  omeprazole (PRILOSEC) 40 MG capsule, Take 1 capsule (40 mg total) by mouth daily. .  Potassium Gluconate 550 MG TABS,  .  Prasterone, DHEA, (DHEA 50) 50 MG CAPS, Take by mouth. .  pregabalin (LYRICA) 200 MG capsule, Take 1 capsule (200 mg total) by mouth 2 (two) times daily. .  SYRINGE-NEEDLE, DISP, 3 ML (B-D 3CC LUER-LOK SYR 22GX1-1/2) 22G X 1-1/2" 3 ML MISC, 1 each by Other route once a  week. * These medications belong to multiple therapeutic classes and are listed under each applicable group.    Past medical history, social, surgical and family history all reviewed in electronic medical record.  No pertanent information unless stated regarding to the chief complaint.   Review of Systems:  No headache, visual changes, nausea, vomiting, diarrhea, constipation, dizziness, abdominal pain, skin rash, fevers, chills, night sweats, weight loss, swollen lymph nodes, body aches, joint swelling, chest pain, shortness of breath, mood changes. POSITIVE muscle aches  Objective  Blood pressure 114/80, pulse 79, height 6' (1.829 m), weight 237 lb (107.5 kg), SpO2 93 %.   General: No apparent distress alert and oriented x3 mood and affect normal, dressed appropriately.  HEENT: Pupils equal, extraocular movements intact  Respiratory: Patient's speak in full sentences and does not appear short of breath  Cardiovascular: No lower extremity edema, non tender, no erythema  Skin: Warm dry intact with no signs of infection or rash on extremities or on axial skeleton.  Abdomen: Soft nontender  Neuro: Cranial nerves II through XII are intact, neurovascularly intact in all extremities with 2+ DTRs and 2+ pulses.  Lymph: No lymphadenopathy of posterior or anterior cervical chain or axillae bilaterally.  Gait normal with good balance and coordination.  MSK:  tender with full range of motion and good stability and symmetric strength and tone of shoulders, elbows, wrist, hip and ankles bilaterally.      Left knee exam still has mild positive patellar grind.  Mild instability noted with varus force.  Does have full range of motion noted.   Impression and Recommendations:     The above documentation has been reviewed and is accurate and complete [name], DO       Note: This dictation was prepared with Dragon dictation along with smaller phrase technology. Any transcriptional errors that  result from this process are unintentional.

## 2019-04-16 NOTE — Patient Instructions (Signed)
Proud of you! Consider half a tab of B complex Send me a message in 2 months See me when you need me

## 2019-04-16 NOTE — Assessment & Plan Note (Signed)
Significant improvement at this time.  Discussed icing regimen and home exercise, we discussed which activities to do which wants to avoid.  Patient is to increase activity slowly over the course of next several weeks.  Discussed with patient about the other medications that he is taking.  As long as patient does well can follow-up as needed

## 2019-05-05 ENCOUNTER — Other Ambulatory Visit: Payer: Self-pay | Admitting: Family Medicine

## 2019-05-05 DIAGNOSIS — E785 Hyperlipidemia, unspecified: Secondary | ICD-10-CM

## 2019-05-05 DIAGNOSIS — E1169 Type 2 diabetes mellitus with other specified complication: Secondary | ICD-10-CM

## 2019-05-06 NOTE — Telephone Encounter (Signed)
Please review

## 2019-05-09 ENCOUNTER — Encounter: Payer: Self-pay | Admitting: Family Medicine

## 2019-05-17 ENCOUNTER — Other Ambulatory Visit: Payer: Self-pay

## 2019-05-17 ENCOUNTER — Encounter: Payer: Self-pay | Admitting: Family Medicine

## 2019-05-17 ENCOUNTER — Ambulatory Visit (INDEPENDENT_AMBULATORY_CARE_PROVIDER_SITE_OTHER): Payer: BC Managed Care – PPO | Admitting: Family Medicine

## 2019-05-17 VITALS — BP 122/70 | HR 90 | Temp 98.0°F | Ht 72.0 in | Wt 217.8 lb

## 2019-05-17 DIAGNOSIS — E785 Hyperlipidemia, unspecified: Secondary | ICD-10-CM | POA: Diagnosis not present

## 2019-05-17 DIAGNOSIS — J4540 Moderate persistent asthma, uncomplicated: Secondary | ICD-10-CM

## 2019-05-17 DIAGNOSIS — Z8639 Personal history of other endocrine, nutritional and metabolic disease: Secondary | ICD-10-CM

## 2019-05-17 DIAGNOSIS — F902 Attention-deficit hyperactivity disorder, combined type: Secondary | ICD-10-CM

## 2019-05-17 DIAGNOSIS — I10 Essential (primary) hypertension: Secondary | ICD-10-CM | POA: Diagnosis not present

## 2019-05-17 DIAGNOSIS — E291 Testicular hypofunction: Secondary | ICD-10-CM

## 2019-05-17 DIAGNOSIS — E1169 Type 2 diabetes mellitus with other specified complication: Secondary | ICD-10-CM | POA: Diagnosis not present

## 2019-05-17 DIAGNOSIS — E538 Deficiency of other specified B group vitamins: Secondary | ICD-10-CM

## 2019-05-17 DIAGNOSIS — Z0000 Encounter for general adult medical examination without abnormal findings: Secondary | ICD-10-CM

## 2019-05-17 DIAGNOSIS — G629 Polyneuropathy, unspecified: Secondary | ICD-10-CM

## 2019-05-17 HISTORY — DX: Personal history of other endocrine, nutritional and metabolic disease: Z86.39

## 2019-05-17 LAB — CBC WITH DIFFERENTIAL/PLATELET
Basophils Absolute: 0.1 10*3/uL (ref 0.0–0.1)
Basophils Relative: 0.9 % (ref 0.0–3.0)
Eosinophils Absolute: 0.1 10*3/uL (ref 0.0–0.7)
Eosinophils Relative: 1 % (ref 0.0–5.0)
HCT: 51.7 % (ref 39.0–52.0)
Hemoglobin: 17 g/dL (ref 13.0–17.0)
Lymphocytes Relative: 32 % (ref 12.0–46.0)
Lymphs Abs: 1.8 10*3/uL (ref 0.7–4.0)
MCHC: 32.9 g/dL (ref 30.0–36.0)
MCV: 89.9 fl (ref 78.0–100.0)
Monocytes Absolute: 0.6 10*3/uL (ref 0.1–1.0)
Monocytes Relative: 10.5 % (ref 3.0–12.0)
Neutro Abs: 3.1 10*3/uL (ref 1.4–7.7)
Neutrophils Relative %: 55.6 % (ref 43.0–77.0)
Platelets: 135 10*3/uL — ABNORMAL LOW (ref 150.0–400.0)
RBC: 5.76 Mil/uL (ref 4.22–5.81)
RDW: 17.3 % — ABNORMAL HIGH (ref 11.5–15.5)
WBC: 5.6 10*3/uL (ref 4.0–10.5)

## 2019-05-17 LAB — COMPREHENSIVE METABOLIC PANEL
ALT: 20 U/L (ref 0–53)
AST: 18 U/L (ref 0–37)
Albumin: 5 g/dL (ref 3.5–5.2)
Alkaline Phosphatase: 39 U/L (ref 39–117)
BUN: 18 mg/dL (ref 6–23)
CO2: 29 mEq/L (ref 19–32)
Calcium: 9.7 mg/dL (ref 8.4–10.5)
Chloride: 99 mEq/L (ref 96–112)
Creatinine, Ser: 1.01 mg/dL (ref 0.40–1.50)
GFR: 80.11 mL/min (ref >60.00)
Glucose, Bld: 84 mg/dL (ref 70–99)
Potassium: 4.8 mEq/L (ref 3.5–5.1)
Sodium: 138 mEq/L (ref 135–145)
Total Bilirubin: 1.6 mg/dL — ABNORMAL HIGH (ref 0.2–1.2)
Total Protein: 7.3 g/dL (ref 6.0–8.3)

## 2019-05-17 LAB — LIPID PANEL
Cholesterol: 107 mg/dL (ref 0–200)
HDL: 39.4 mg/dL (ref >39.00)
LDL Cholesterol: 56 mg/dL (ref 0–99)
NonHDL: 68.06
Total CHOL/HDL Ratio: 3
Triglycerides: 61 mg/dL (ref 0.0–149.0)
VLDL: 12.2 mg/dL (ref 0.0–40.0)

## 2019-05-17 LAB — POCT GLYCOSYLATED HEMOGLOBIN (HGB A1C): Hemoglobin A1C: 5 % (ref 4.0–5.6)

## 2019-05-17 LAB — TSH: TSH: 1.84 u[IU]/mL (ref 0.35–4.50)

## 2019-05-17 MED ORDER — LISDEXAMFETAMINE DIMESYLATE 60 MG PO CAPS: 60.0000 mg | ORAL_CAPSULE | ORAL | 0 refills | Status: DC

## 2019-05-17 NOTE — Patient Instructions (Signed)
Please return in 3 months for recheck.   I will release your lab results to you on your MyChart account with further instructions. Please reply with any questions.   If you have any questions or concerns, please don't hesitate to send me a message via MyChart or call the office at [phone]. Thank you for visiting with us today! It's our pleasure caring for you.   

## 2019-05-17 NOTE — Progress Notes (Signed)
Subjective  Chief Complaint  Patient presents with  . Annual Exam    fasting. both feet hurt, dry and cracked. Concerns about moles on back.  . Diabetes    does not check at home. Exercises 6 days a week and diet is clean  . Hypertension    takes lisinopril daily. no side effects    HPI: Brett [name] is a 44 y.o. male who presents to [facility] today for a Male Wellness Visit. He also has the concerns and/or needs as listed above in the chief complaint. These will be addressed in addition to the Health Maintenance Visit.   Wellness Visit: annual visit with health maintenance review and exam    HM: imms up to date. Exercises for hours daily, in part to manage mood d/o.  Lifestyle: Body mass index is 29.54 kg/m. Wt Readings from Last 3 Encounters:  05/17/19 217 lb 12.8 oz (98.8 kg)  04/16/19 237 lb (107.5 kg)  02/15/19 230 lb (104.3 kg)    Chronic disease management visit and/or acute problem visit:  H/o obesity, now BMI < 30. On saxenda for night cravings. Helps.   Bipolar d/o on wellbutrin. Manages behaviorally with exercise and activity.   ADD; on vyvanse. Report control is good  H/o HTN on ACE w/ h/o diabetes. Nl a1c for > 1 year. Likely resolved. No CAD.   Low T on testosterone twice monthly. Due for recheck.   HLD on statin. Was started in setting of diabetes. May be able to come off as well in future.   Peripheral neuropathy with daily pain uncontrolled on lyrica.   Back pain and knee pain per SM.   H/o GERD: no longer active since weight loss  Vit b12 deficiency; recent dx on supplements now.   Patient Active Problem List   Diagnosis Date Noted  . Attention deficit hyperactivity disorder (ADHD), combined type 09/27/2017  . Hyperlipidemia associated with type 2 diabetes mellitus ([place]) 06/16/2016  . Bipolar 2 disorder ([place]) 06/15/2016  . Essential hypertension 06/15/2016  . Peripheral polyneuropathy 06/15/2016  .  Insomnia 06/15/2016  . Unilateral primary osteoarthritis, left knee 07/10/2017  . Chronic midline low back pain without sciatica 07/10/2017  . Hypogonadism in male 05/20/2017  . Moderate persistent asthma   . Vitamin B12 deficiency 05/17/2019  . Spondylolisthesis, lumbosacral region 07/10/2017  . Left rotator cuff tear arthropathy, s/p PT and repair 06/15/2016  . Chronic seasonal allergic rhinitis due to pollen 06/15/2016  . History of diabetes mellitus 05/17/2019   Health Maintenance  Topic Date Due  . Samul Dada  05/19/2027  . INFLUENZA VACCINE  Completed  . HIV Screening  Completed   Immunization History  Administered Date(s) Administered  . Influenza,inj,Quad PF,6+ Mos 12/19/2016, 02/16/2018, 01/18/2019  . Pneumococcal Polysaccharide-23 09/16/2016  . Tdap 05/18/2017   We updated and reviewed the patient's past history in detail and it is documented below. Allergies: Patient has No Known Allergies. Past Medical History  has a past medical history of Asthma, Bipolar 1 disorder ([place]) (06/15/2016), Chronic pain of both knees (06/15/2016), Chronic seasonal allergic rhinitis due to pollen (06/15/2016), Depression, Gastroesophageal reflux disease without esophagitis (06/15/2016), History of diabetes mellitus (05/17/2019), Hypertension, Insomnia (06/15/2016), Left rotator cuff tear arthropathy (06/15/2016), and Morbid obesity ([place]) (06/15/2016). Past Surgical History Patient  has a past surgical history that includes Biceps tendon repair and Panniculectomy (11/15/2018). Social History Patient  reports that he has never smoked. He has never used smokeless tobacco. He reports that  he does not drink alcohol or use drugs. Family History family history is not on file. Review of Systems: Constitutional: negative for fever or malaise Ophthalmic: negative for photophobia, double vision or loss of vision Cardiovascular: negative for chest pain, dyspnea on exertion, or new LE swelling Respiratory:  negative for SOB or persistent cough Gastrointestinal: negative for abdominal pain, change in bowel habits or melena Genitourinary: negative for dysuria or gross hematuria Musculoskeletal: negative for new gait disturbance or muscular weakness Integumentary: negative for new or persistent rashes Neurological: negative for TIA or stroke symptoms Psychiatric: negative for SI or delusions Allergic/Immunologic: negative for hives  Patient Care Team    Relationship Specialty Notifications Start End  [name], MD PCP - General Family Medicine  02/04/19   Newt Minion, MD Consulting Physician Orthopedic Surgery  09/27/17   Gerda Diss, DO Consulting Physician Family Medicine  09/27/17   Curt Jews, OD  Optometry  11/14/17    Objective  Vitals: BP 122/70 (BP Location: Left Arm, Patient Position: Sitting, Cuff Size: Large)   Pulse 90   Temp 98 F (36.7 C) (Temporal)   Ht 6' (1.829 m)   Wt 217 lb 12.8 oz (98.8 kg)   SpO2 98%   BMI 29.54 kg/m  General:  Well developed, well nourished, no acute distress , fit appearing Psych:  Alert and orientedx3, nl mood and anxious affect HEENT:  Normocephalic, atraumatic, non-icteric sclera, PERRL, supple neck without adenopathy, mass or thyromegaly Cardiovascular:  Normal S1, S2, RRR without gallop, rub or murmur, nondisplaced PMI, +2 distal pulses in bilateral upper and lower extremities. Respiratory:  Good breath sounds bilaterally, CTAB with normal respiratory effort Gastrointestinal: normal bowel sounds, soft, non-tender, no noted masses. No HSM MSK: no deformities, contusions. Joints are without erythema or swelling. Spine and CVA region are nontender Skin:  Warm, no rashes or suspicious lesions noted Neurologic:    Mental status is normal. CN 2-11 are normal. Gross motor and sensory exams are normal. Stable gait. No tremor GU: No inguinal hernias or adenopathy are appreciated bilaterally   Lab Results  Component Value Date   HGBA1C  5.0 05/17/2019   HGBA1C 5.0 02/12/2019   HGBA1C 5.4 08/01/2018    Lab Results  Component Value Date   CHOL 106 09/27/2017   CHOL 150 12/19/2016   CHOL 213 (H) 06/15/2016   Lab Results  Component Value Date   HDL 31.80 (L) 09/27/2017   HDL 38.50 (L) 12/19/2016   HDL 40.90 06/15/2016   Lab Results  Component Value Date   LDLCALC 54 09/27/2017   LDLCALC 84 12/19/2016   LDLCALC 133 (H) 06/15/2016   Lab Results  Component Value Date   TRIG 104.0 09/27/2017   TRIG 137.0 12/19/2016   TRIG 198.0 (H) 06/15/2016   Lab Results  Component Value Date   CHOLHDL 3 09/27/2017   CHOLHDL 4 12/19/2016   CHOLHDL 5 06/15/2016   No results found for: LDLDIRECT  Lab Results  Component Value Date   CREATININE 0.87 09/19/2018   BUN 11 09/19/2018   NA 139 09/19/2018   K 5.0 09/19/2018   CL 101 09/19/2018   CO2 30 09/19/2018    Assessment  1. Annual physical exam   2. Attention deficit hyperactivity disorder (ADHD), combined type   3. Essential hypertension   4. Hyperlipidemia associated with type 2 diabetes mellitus ([place])   5. Peripheral polyneuropathy   6. Hypogonadism in male   7. Moderate persistent asthma without complication  8. Vitamin B12 deficiency   9. History of diabetes mellitus, type II      Plan  Male Wellness Visit:  Age appropriate Health Maintenance and Prevention measures were discussed with patient. Included topics are cancer screening recommendations, ways to keep healthy (see AVS) including dietary and exercise recommendations, regular eye and dental care, use of seat belts, and avoidance of moderate alcohol use and tobacco use.   BMI: discussed patient's BMI and encouraged positive lifestyle modifications to help get to or maintain a target BMI.  HM needs and immunizations were addressed and ordered. See below for orders. See HM and immunization section for updates.  Routine labs and screening tests ordered including cmp, cbc and lipids where  appropriate.  Discussed recommendations regarding Vit D and calcium supplementation (see AVS)  Chronic disease f/u and/or acute problem visit: (deemed necessary to be done in addition to the wellness visit):  H/o DM: resolved. Continue clean diet. Consider stopping statin. Stop ACE and monitor BP.   Low T recheck today  Peripheral neuropathy: toughest problem. Continue lyrica.   ADD refilled meds x 3 months  Bipolar: on wellbutrin. Declines psychiatrist. Reports controlled.   Low vitamin B12  Back and knee pain per sports med  Weight mgt: on saxenda. Recommend stopping soon.   Follow up: Return in about 3 months (around 08/14/2019) for follow up on ADD. f/u on weight mgt and blood pressure off ace  Commons side effects, risks, benefits, and alternatives for medications and treatment plan prescribed today were discussed, and the patient expressed understanding of the given instructions. Patient is instructed to call or message via MyChart if he/she has any questions or concerns regarding our treatment plan. No barriers to understanding were identified. We discussed Red Flag symptoms and signs in detail. Patient expressed understanding regarding what to do in case of urgent or emergency type symptoms.   Medication list was reconciled, printed and provided to the patient in AVS. Patient instructions and summary information was reviewed with the patient as documented in the AVS. This note was prepared with assistance of Dragon voice recognition software. Occasional wrong-word or sound-a-like substitutions may have occurred due to the inherent limitations of voice recognition software  This visit occurred during the SARS-CoV-2 public health emergency.  Safety protocols were in place, including screening questions prior to the visit, additional usage of staff PPE, and extensive cleaning of exam room while observing appropriate contact time as indicated for disinfecting solutions.   Orders  Placed This Encounter  Procedures  . CBC with Differential/Platelet  . Comprehensive metabolic panel  . Lipid panel  . TSH  . Testosterone Total,Free,Bio, Males  . POCT glycosylated hemoglobin (Hb A1C)   Meds ordered this encounter  Medications  . DISCONTD: lisdexamfetamine (VYVANSE) 60 MG capsule    Sig: Take 1 capsule (60 mg total) by mouth every morning.    Dispense:  30 capsule    Refill:  0  . DISCONTD: lisdexamfetamine (VYVANSE) 60 MG capsule    Sig: Take 1 capsule (60 mg total) by mouth every morning.    Dispense:  30 capsule    Refill:  0  . lisdexamfetamine (VYVANSE) 60 MG capsule    Sig: Take 1 capsule (60 mg total) by mouth every morning.    Dispense:  30 capsule    Refill:  0

## 2019-05-20 LAB — TESTOSTERONE TOTAL,FREE,BIO, MALES
Albumin: 5.3 g/dL — ABNORMAL HIGH (ref 3.6–5.1)
Sex Hormone Binding: 26 nmol/L (ref 10–50)
Testosterone, Bioavailable: 243.8 ng/dL (ref >=110.0)
Testosterone, Free: 101.4 pg/mL (ref 46.0–224.0)
Testosterone: 634 ng/dL (ref 250–827)

## 2019-05-27 ENCOUNTER — Encounter: Payer: Self-pay | Admitting: Family Medicine

## 2019-05-28 ENCOUNTER — Encounter: Payer: Self-pay | Admitting: Physician Assistant

## 2019-05-28 ENCOUNTER — Other Ambulatory Visit: Payer: Self-pay

## 2019-05-28 ENCOUNTER — Ambulatory Visit (INDEPENDENT_AMBULATORY_CARE_PROVIDER_SITE_OTHER): Payer: BC Managed Care – PPO | Admitting: Physician Assistant

## 2019-05-28 VITALS — BP 120/76 | HR 103 | Temp 97.5°F | Ht 72.0 in | Wt 222.4 lb

## 2019-05-28 DIAGNOSIS — H9391 Unspecified disorder of right ear: Secondary | ICD-10-CM

## 2019-05-28 NOTE — Progress Notes (Signed)
Hematoma Aspiration Procedure Note: Verbal consent was obtained after explaining risks and benefits of procedure in addition alternatives.  The area was cleansed with alcohol pad.  Topical ethyl chloride was applied.  Approximately 0.5 mL of 1% lidocaine without epinephrine was then infiltrated into the hematoma.  After adequate anesthesia an 18-gauge needle was then inserted into the hematoma.  Approximately 1 mL of serosanguineous fluid was aspirated.  Needle was then withdrawn.  Area was expressed with removal of approximately 1 mL of additional blood.  Bandage was applied to the area.  Patient tolerated well.  Blood loss approximately 3 mL.  Brett [name]. [name], MD 05/28/2019 3:50 PM

## 2019-05-28 NOTE — Progress Notes (Signed)
Brett [name] is a 44 y.o. male here for a new problem.  I acted as a Education administrator for [employer], PA-C Anselmo Pickler, LPN  History of Present Illness:   Chief Complaint  Patient presents with  . Mass    HPI   Lump Pt c/o lump on right ear since Saturday. The area feels swollen, had pain but now gone. He states that he does jujutsu and had trauma to his R ear. Denies: fever, chills, purulent discharge, ringing of ears, hearing loss.  Past Medical History:  Diagnosis Date  . Asthma   . Bipolar 1 disorder ([place]) 06/15/2016   Previously followed by Psychiatry. Hx of "anger issues." Hx of medication use has included Seroquel and Ativan, both of which have helped him in the past.   . Chronic pain of both knees 06/15/2016  . Chronic seasonal allergic rhinitis due to pollen 06/15/2016  . Depression   . Gastroesophageal reflux disease without esophagitis 06/15/2016  . History of diabetes mellitus 05/17/2019   Resolved after > 100 pound weight loss and lifestyle changes. 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   . Hypertension   . Insomnia 06/15/2016  . Left rotator cuff tear arthropathy 06/15/2016   s/p repair and PT. No ongoing issues.  . Morbid obesity ([place]) 06/15/2016     Social History   Socioeconomic History  . Marital status: Married    Spouse name: Not on file  . Number of children: 2  . Years of education: Not on file  . Highest education level: Not on file  Occupational History  . Occupation: Camera operator: [employer]  Tobacco Use  . Smoking status: Never Smoker  . Smokeless tobacco: Never Used  Substance and Sexual Activity  . Alcohol use: No  . Drug use: No  . Sexual activity: Yes  Other Topics Concern  . Not on file  Social History Narrative   Lives with wife and two children, all patients of [facility]. Wants to get back to weight lifting. Lives near [place] in [place].   Social Determinants of Health    Financial Resource Strain:   . Difficulty of Paying Living Expenses: Not on file  Food Insecurity:   . Worried About Charity fundraiser in the Last Year: Not on file  . Ran Out of Food in the Last Year: Not on file  Transportation Needs:   . Lack of Transportation (Medical): Not on file  . Lack of Transportation (Non-Medical): Not on file  Physical Activity:   . Days of Exercise per Week: Not on file  . Minutes of Exercise per Session: Not on file  Stress:   . Feeling of Stress : Not on file  Social Connections:   . Frequency of Communication with Friends and Family: Not on file  . Frequency of Social Gatherings with Friends and Family: Not on file  . Attends Religious Services: Not on file  . Active Member of Clubs or Organizations: Not on file  . Attends Archivist Meetings: Not on file  . Marital Status: Not on file  Intimate Partner Violence:   . Fear of Current or Ex-Partner: Not on file  . Emotionally Abused: Not on file  . Physically Abused: Not on file  . Sexually Abused: Not on file    Past Surgical History:  Procedure Laterality Date  . BICEPS TENDON REPAIR    . PANNICULECTOMY  11/15/2018   loose skin removal  surgery    History reviewed. No pertinent family history.  No Known Allergies  Current Medications:   Current Outpatient Medications:  .  albuterol (VENTOLIN HFA) 108 (90 Base) MCG/ACT inhaler, INHALE 2 PUFFS BY MOUTH EVERY 6 HOURS AS NEEDED FOR WHEEZE OR SHORTNESS OF BREATH, Disp: 18 g, Rfl: 1 .  buPROPion (WELLBUTRIN XL) 150 MG 24 hr tablet, TAKE 1 TABLET (150 MG TOTAL) BY MOUTH DAILY, Disp: 180 tablet, Rfl: 1 .  Calcium Carb-Cholecalciferol (CALCIUM 1000 + D) 1000-800 MG-UNIT TABS, , Disp: , Rfl:  .  celecoxib (CELEBREX) 100 MG capsule, Take 1 capsule (100 mg total) by mouth 2 (two) times daily as needed., Disp: 180 capsule, Rfl: 1 .  cyanocobalamin 1000 MCG tablet, Take 1,000 mcg by mouth daily., Disp: , Rfl:  .  Insulin Pen Needle 32G X  4 MM MISC, Use 1 needle per day as directed, Disp: 30 each, Rfl: 3 .  [START ON 07/16/2019] lisdexamfetamine (VYVANSE) 60 MG capsule, Take 1 capsule (60 mg total) by mouth every morning., Disp: 30 capsule, Rfl: 0 .  MAGNESIUM CITRATE PO, Take 1 tablet by mouth every evening., Disp: , Rfl:  .  methocarbamol (ROBAXIN-750) 750 MG tablet, Take 1 tablet (750 mg total) by mouth 3 (three) times daily as needed for muscle spasms., Disp: 90 tablet, Rfl: 3 .  Potassium Gluconate 550 MG TABS, , Disp: , Rfl:  .  pregabalin (LYRICA) 200 MG capsule, Take 1 capsule (200 mg total) by mouth 2 (two) times daily., Disp: 180 capsule, Rfl: 2 .  rosuvastatin (CRESTOR) 10 MG tablet, TAKE 1 TABLET BY MOUTH EVERY DAY, Disp: 90 tablet, Rfl: 3 .  SYRINGE-NEEDLE, DISP, 3 ML (B-D 3CC LUER-LOK SYR 22GX1-1/2) 22G X 1-1/2" 3 ML MISC, 1 each by Other route once a week., Disp: 4 each, Rfl: 12 .  testosterone cypionate (DEPOTESTOSTERONE CYPIONATE) 200 MG/ML injection, INJECT 1 MILLILITER ONCE WEEKLY FOR 4 WEEKS THEN 1 MILLILITER BIWEEKLY FOR 2 MONTHS, Disp: 6 mL, Rfl: 1   Review of Systems:   ROS  Negative unless otherwise specified per HPI.   Vitals:   Vitals:   05/28/19 1349  BP: 120/76  Pulse: (!) 103  Temp: (!) 97.5 F (36.4 C)  TempSrc: Temporal  SpO2: 95%  Weight: 222 lb 6.1 oz (100.9 kg)  Height: 6' (1.829 m)     Body mass index is 30.16 kg/m.  Physical Exam:   Physical Exam Vitals and nursing note reviewed.  Constitutional:      Appearance: He is well-developed.  HENT:     Head: Normocephalic.     Ears:     Comments: Small area of fluid collection on R auricle, no TTP, no induration Eyes:     Conjunctiva/sclera: Conjunctivae normal.     Pupils: Pupils are equal, round, and reactive to light.  Pulmonary:     Effort: Pulmonary effort is normal.  Musculoskeletal:        General: Normal range of motion.     Cervical back: Normal range of motion.  Skin:    General: Skin is warm and dry.   Neurological:     Mental Status: He is alert and oriented to person, place, and time.  Psychiatric:        Behavior: Behavior normal.        Thought Content: Thought content normal.        Judgment: Judgment normal.       Assessment and Plan:   Angus was seen today for  mass.  Diagnoses and all orders for this visit:  Problem of right ear   Small hematoma that was aspirated by Dr. Dimas Chyle. Patient tolerated procedure well, reviewed signs of infection, management of wound and reasons to reach out to [place].  . Reviewed expectations re: course of current medical issues. . Discussed self-management of symptoms. . Outlined signs and symptoms indicating need for more acute intervention. . Patient verbalized understanding and all questions were answered. . See orders for this visit as documented in the electronic medical record. . Patient received an After-Visit Summary.  CMA or LPN served as scribe during this visit. History, Physical, and Plan performed by medical provider. The above documentation has been reviewed and is accurate and complete.   Inda Coke, PA-C

## 2019-05-29 ENCOUNTER — Other Ambulatory Visit: Payer: Self-pay | Admitting: Family Medicine

## 2019-05-29 DIAGNOSIS — E291 Testicular hypofunction: Secondary | ICD-10-CM

## 2019-06-02 ENCOUNTER — Encounter: Payer: Self-pay | Admitting: Family Medicine

## 2019-06-02 DIAGNOSIS — E291 Testicular hypofunction: Secondary | ICD-10-CM

## 2019-06-03 MED ORDER — TESTOSTERONE CYPIONATE 200 MG/ML IM SOLN: 200.0000 mg | INTRAMUSCULAR | 3 refills | Status: DC

## 2019-06-03 NOTE — Telephone Encounter (Signed)
You have not give the script for this. Was given by Juleen China ok to fill?

## 2019-06-05 ENCOUNTER — Telehealth: Payer: Self-pay

## 2019-06-05 NOTE — Telephone Encounter (Signed)
We stopped it in February. No refill. thanks

## 2019-06-05 NOTE — Telephone Encounter (Signed)
Pharmacy sent a refill request for Lisinopril 5 mg Tab, however this medication is not currently on patient's chart. Please advise.

## 2019-06-06 NOTE — Telephone Encounter (Signed)
Noted  

## 2019-06-19 ENCOUNTER — Other Ambulatory Visit: Payer: Self-pay | Admitting: Family Medicine

## 2019-06-19 ENCOUNTER — Encounter: Payer: Self-pay | Admitting: Family Medicine

## 2019-06-19 ENCOUNTER — Other Ambulatory Visit: Payer: Self-pay

## 2019-06-19 DIAGNOSIS — G629 Polyneuropathy, unspecified: Secondary | ICD-10-CM

## 2019-06-19 MED ORDER — PREGABALIN 200 MG PO CAPS: 200.0000 mg | ORAL_CAPSULE | Freq: Two times a day (BID) | ORAL | 3 refills | Status: DC

## 2019-06-19 NOTE — Telephone Encounter (Signed)
Last refill: 9.21.20 #180, 2 Last OV: 3.2.21 dx. Right ear pain

## 2019-06-24 ENCOUNTER — Other Ambulatory Visit: Payer: Self-pay

## 2019-06-24 MED ORDER — LISDEXAMFETAMINE DIMESYLATE 60 MG PO CAPS: 60.0000 mg | ORAL_CAPSULE | ORAL | 0 refills | Status: DC

## 2019-06-24 NOTE — Telephone Encounter (Signed)
Last refill: 2.19.21 #30, 0 Last OV: 2.19.21 dx. CPE

## 2019-07-19 ENCOUNTER — Encounter: Payer: Self-pay | Admitting: Family Medicine

## 2019-07-19 ENCOUNTER — Other Ambulatory Visit: Payer: Self-pay

## 2019-07-19 DIAGNOSIS — J4530 Mild persistent asthma, uncomplicated: Secondary | ICD-10-CM

## 2019-07-19 MED ORDER — ALBUTEROL SULFATE HFA 108 (90 BASE) MCG/ACT IN AERS: INHALATION_SPRAY | RESPIRATORY_TRACT | 3 refills | Status: DC

## 2019-07-30 ENCOUNTER — Telehealth: Payer: Self-pay

## 2019-07-30 ENCOUNTER — Other Ambulatory Visit: Payer: Self-pay

## 2019-07-30 MED ORDER — SAXENDA 18 MG/3ML ~~LOC~~ SOPN: 1.2000 mg | PEN_INJECTOR | Freq: Every day | SUBCUTANEOUS | 2 refills | Status: DC

## 2019-07-30 NOTE — Telephone Encounter (Signed)
He should be steady at the 1.2 mg dose daily. thanks

## 2019-07-30 NOTE — Telephone Encounter (Signed)
Medication instructions?  0.6 mg daily for 7 days and then 1.2 mg for 21 days?  Please advise

## 2019-07-30 NOTE — Telephone Encounter (Signed)
PA has been approved for Saxenda. Per 05/17/19 CPE visit, you recommended that patient stop medication soon. Please advise if further refills are appropriate.

## 2019-07-30 NOTE — Telephone Encounter (Signed)
Pt prefers to stay on it a bit longer. Ok to refill. thanks

## 2019-07-30 NOTE — Telephone Encounter (Signed)
Error

## 2019-08-15 ENCOUNTER — Other Ambulatory Visit: Payer: Self-pay | Admitting: Family Medicine

## 2019-08-15 DIAGNOSIS — E1159 Type 2 diabetes mellitus with other circulatory complications: Secondary | ICD-10-CM

## 2019-08-15 DIAGNOSIS — E1142 Type 2 diabetes mellitus with diabetic polyneuropathy: Secondary | ICD-10-CM

## 2019-08-19 ENCOUNTER — Encounter: Payer: Self-pay | Admitting: Family Medicine

## 2019-08-19 ENCOUNTER — Ambulatory Visit (INDEPENDENT_AMBULATORY_CARE_PROVIDER_SITE_OTHER): Payer: BC Managed Care – PPO | Admitting: Family Medicine

## 2019-08-19 ENCOUNTER — Other Ambulatory Visit: Payer: Self-pay

## 2019-08-19 VITALS — BP 122/70 | HR 87 | Temp 98.0°F | Resp 18 | Ht 72.0 in | Wt 226.6 lb

## 2019-08-19 DIAGNOSIS — I10 Essential (primary) hypertension: Secondary | ICD-10-CM | POA: Diagnosis not present

## 2019-08-19 DIAGNOSIS — R17 Unspecified jaundice: Secondary | ICD-10-CM

## 2019-08-19 DIAGNOSIS — F902 Attention-deficit hyperactivity disorder, combined type: Secondary | ICD-10-CM | POA: Diagnosis not present

## 2019-08-19 DIAGNOSIS — E785 Hyperlipidemia, unspecified: Secondary | ICD-10-CM

## 2019-08-19 DIAGNOSIS — L700 Acne vulgaris: Secondary | ICD-10-CM | POA: Diagnosis not present

## 2019-08-19 LAB — COMPREHENSIVE METABOLIC PANEL
ALT: 20 U/L (ref 0–53)
AST: 24 U/L (ref 0–37)
Albumin: 4.9 g/dL (ref 3.5–5.2)
Alkaline Phosphatase: 35 U/L — ABNORMAL LOW (ref 39–117)
BUN: 14 mg/dL (ref 6–23)
CO2: 31 mEq/L (ref 19–32)
Calcium: 9.3 mg/dL (ref 8.4–10.5)
Chloride: 102 mEq/L (ref 96–112)
Creatinine, Ser: 0.91 mg/dL (ref 0.40–1.50)
GFR: 90.25 mL/min (ref >60.00)
Glucose, Bld: 93 mg/dL (ref 70–99)
Potassium: 5.2 mEq/L — ABNORMAL HIGH (ref 3.5–5.1)
Sodium: 140 mEq/L (ref 135–145)
Total Bilirubin: 1 mg/dL (ref 0.2–1.2)
Total Protein: 6.7 g/dL (ref 6.0–8.3)

## 2019-08-19 MED ORDER — LISDEXAMFETAMINE DIMESYLATE 60 MG PO CAPS: 60.0000 mg | ORAL_CAPSULE | ORAL | 0 refills | Status: DC

## 2019-08-19 NOTE — Progress Notes (Signed)
Subjective  CC:  Chief Complaint  Patient presents with  . ADHD    Tolerating medication well. Needs a refill on Vyvanse  . Nevus    Concerns for the moles on his back.   . Health Maintenance    Not interested in receiving the covid vaccine.     HPI: Brett [name] is a 44 y.o. male who presents to the office today to address the problems listed above in the chief complaint.  Hypertension f/u: h/o HTN stopped ACE 3 months ago. Feels well  ADD: well controlled on vyvanse. Needs refills  Elevated bilirubin: needs to be rechecked today. New problem noted on labs from February. No RUQ pain, itching, jaundice or h/o liver problems.   Moles on back: no FH of melanoma but "+ skin cancers". C/o acne at times. Breaks out with black heads and occ pimple: exacerbated by mask and exercise.   HLD: stopped statin 2 weeks ago.   Assessment  1. Attention deficit hyperactivity disorder (ADHD), combined type   2. Elevated bilirubin   3. Essential hypertension   4. Acne vulgaris   5. Hyperlipidemia, unspecified hyperlipidemia type      Plan    Hypertension f/u: BP control is normal w/o medication. Resolved HTN. Continue healthy diet  Elevated bili f/u: recheck today. Asymptomatic  ADD is well controlled. Filled RXs for 6 months. Printed RXs for patient  Acne:trial of otc epiduo.  Mild on exam today.   HLD: now off statin. Will recheck in 6 months and reassess if indicated or not. Since resolving diabetes, no indication currently.   Education regarding management of these chronic disease states was given. Management strategies discussed on successive visits include dietary and exercise recommendations, goals of achieving and maintaining IBW, and lifestyle modifications aiming for adequate sleep and minimizing stressors.   Follow up: Return in about 6 months (around 02/19/2020) for follow up on ADD, recheck bp and lipids off meds.  Orders Placed This Encounter  Procedures  .  Comprehensive metabolic panel   Meds ordered this encounter  Medications  . DISCONTD: lisdexamfetamine (VYVANSE) 60 MG capsule    Sig: Take 1 capsule (60 mg total) by mouth every morning.    Dispense:  30 capsule    Refill:  0  . DISCONTD: lisdexamfetamine (VYVANSE) 60 MG capsule    Sig: Take 1 capsule (60 mg total) by mouth every morning.    Dispense:  30 capsule    Refill:  0  . DISCONTD: lisdexamfetamine (VYVANSE) 60 MG capsule    Sig: Take 1 capsule (60 mg total) by mouth every morning.    Dispense:  30 capsule    Refill:  0  . DISCONTD: lisdexamfetamine (VYVANSE) 60 MG capsule    Sig: Take 1 capsule (60 mg total) by mouth every morning.    Dispense:  30 capsule    Refill:  0  . DISCONTD: lisdexamfetamine (VYVANSE) 60 MG capsule    Sig: Take 1 capsule (60 mg total) by mouth every morning.    Dispense:  30 capsule    Refill:  0  . lisdexamfetamine (VYVANSE) 60 MG capsule    Sig: Take 1 capsule (60 mg total) by mouth every morning.    Dispense:  30 capsule    Refill:  0      BP Readings from Last 3 Encounters:  08/19/19 122/70  05/28/19 120/76  05/17/19 122/70   Wt Readings from Last 3 Encounters:  08/19/19 226 lb 9.6 oz (102.8  kg)  05/28/19 222 lb 6.1 oz (100.9 kg)  05/17/19 217 lb 12.8 oz (98.8 kg)    Lab Results  Component Value Date   CHOL 107 05/17/2019   CHOL 106 09/27/2017   CHOL 150 12/19/2016   Lab Results  Component Value Date   HDL 39.40 05/17/2019   HDL 31.80 (L) 09/27/2017   HDL 38.50 (L) 12/19/2016   Lab Results  Component Value Date   LDLCALC 56 05/17/2019   LDLCALC 54 09/27/2017   LDLCALC 84 12/19/2016   Lab Results  Component Value Date   TRIG 61.0 05/17/2019   TRIG 104.0 09/27/2017   TRIG 137.0 12/19/2016   Lab Results  Component Value Date   CHOLHDL 3 05/17/2019   CHOLHDL 3 09/27/2017   CHOLHDL 4 12/19/2016   No results found for: LDLDIRECT Lab Results  Component Value Date   CREATININE 1.01 05/17/2019   BUN 18  05/17/2019   NA 138 05/17/2019   K 4.8 05/17/2019   CL 99 05/17/2019   CO2 29 05/17/2019    The ASCVD Risk score (Goff DC Jr., et al., 2013) failed to calculate for the following reasons:   The valid total cholesterol range is 130 to 320 mg/dL  I reviewed the patients updated PMH, FH, and SocHx.    Patient Active Problem List   Diagnosis Date Noted  . Attention deficit hyperactivity disorder (ADHD), combined type 09/27/2017    Priority: High  . Bipolar 2 disorder ([place]) 06/15/2016    Priority: High  . Peripheral polyneuropathy 06/15/2016    Priority: High  . Insomnia 06/15/2016    Priority: High  . Unilateral primary osteoarthritis, left knee 07/10/2017    Priority: Medium  . Chronic midline low back pain without sciatica 07/10/2017    Priority: Medium  . Hypogonadism in male 05/20/2017    Priority: Medium  . Moderate persistent asthma     Priority: Medium  . Vitamin B12 deficiency 05/17/2019    Priority: Low  . Spondylolisthesis, lumbosacral region 07/10/2017    Priority: Low  . Left rotator cuff tear arthropathy, s/p PT and repair 06/15/2016    Priority: Low  . Chronic seasonal allergic rhinitis due to pollen 06/15/2016    Priority: Low  . History of diabetes mellitus 05/17/2019    Allergies: Patient has no known allergies.  Social History: Patient  reports that he has never smoked. He has never used smokeless tobacco. He reports that he does not drink alcohol or use drugs.  Current Meds  Medication Sig  . albuterol (VENTOLIN HFA) 108 (90 Base) MCG/ACT inhaler INHALE 2 PUFFS BY MOUTH EVERY 6 HOURS AS NEEDED FOR WHEEZE OR SHORTNESS OF BREATH  . buPROPion (WELLBUTRIN XL) 150 MG 24 hr tablet TAKE 1 TABLET (150 MG TOTAL) BY MOUTH DAILY  . Calcium Carb-Cholecalciferol (CALCIUM 1000 + D) 1000-800 MG-UNIT TABS   . celecoxib (CELEBREX) 100 MG capsule Take 1 capsule (100 mg total) by mouth 2 (two) times daily as needed.  . cyanocobalamin 1000 MCG tablet Take 1,000 mcg by  mouth daily.  . Insulin Pen Needle 32G X 4 MM MISC Use 1 needle per day as directed  . Liraglutide -Weight Management (SAXENDA) 18 MG/3ML SOPN Inject 0.2 mLs (1.2 mg total) into the skin daily.  Derrill Memo ON 01/19/2020] lisdexamfetamine (VYVANSE) 60 MG capsule Take 1 capsule (60 mg total) by mouth every morning.  Marland Kitchen MAGNESIUM CITRATE PO Take 1 tablet by mouth every evening.  . methocarbamol (ROBAXIN-750) 750 MG tablet Take  1 tablet (750 mg total) by mouth 3 (three) times daily as needed for muscle spasms.  . Potassium Gluconate 550 MG TABS   . pregabalin (LYRICA) 200 MG capsule Take 1 capsule (200 mg total) by mouth 2 (two) times daily.  . SYRINGE-NEEDLE, DISP, 3 ML (B-D 3CC LUER-LOK SYR 22GX1-1/2) 22G X 1-1/2" 3 ML MISC 1 each by Other route once a week.  . testosterone cypionate (DEPOTESTOSTERONE CYPIONATE) 200 MG/ML injection Inject 1 mL (200 mg total) into the muscle every 14 (fourteen) days.  . [DISCONTINUED] lisdexamfetamine (VYVANSE) 60 MG capsule Take 1 capsule (60 mg total) by mouth every morning.  . [DISCONTINUED] lisdexamfetamine (VYVANSE) 60 MG capsule Take 1 capsule (60 mg total) by mouth every morning.  . [DISCONTINUED] lisdexamfetamine (VYVANSE) 60 MG capsule Take 1 capsule (60 mg total) by mouth every morning.  . [DISCONTINUED] lisdexamfetamine (VYVANSE) 60 MG capsule Take 1 capsule (60 mg total) by mouth every morning.  . [DISCONTINUED] lisdexamfetamine (VYVANSE) 60 MG capsule Take 1 capsule (60 mg total) by mouth every morning.  . [DISCONTINUED] lisdexamfetamine (VYVANSE) 60 MG capsule Take 1 capsule (60 mg total) by mouth every morning.    Review of Systems: Cardiovascular: negative for chest pain, palpitations, leg swelling, orthopnea Respiratory: negative for SOB, wheezing or persistent cough Gastrointestinal: negative for abdominal pain Genitourinary: negative for dysuria or gross hematuria  Objective  Vitals: BP 122/70   Pulse 87   Temp 98 F (36.7 C) (Temporal)    Resp 18   Ht 6' (1.829 m)   Wt 226 lb 9.6 oz (102.8 kg)   SpO2 97%   BMI 30.73 kg/m  General: no acute distress  Psych:  Alert and oriented, normal mood and affect HEENT:  Normocephalic, atraumatic, supple neck  Cardiovascular:  RRR without murmur. no edema Respiratory:  Good breath sounds bilaterally, CTAB with normal respiratory effort Skin:  Warm, benign appearing moles on back. Face w/o comedones or redness Neurologic:   Mental status is normal  Commons side effects, risks, benefits, and alternatives for medications and treatment plan prescribed today were discussed, and the patient expressed understanding of the given instructions. Patient is instructed to call or message via MyChart if he/she has any questions or concerns regarding our treatment plan. No barriers to understanding were identified. We discussed Red Flag symptoms and signs in detail. Patient expressed understanding regarding what to do in case of urgent or emergency type symptoms.   Medication list was reconciled, printed and provided to the patient in AVS. Patient instructions and summary information was reviewed with the patient as documented in the AVS. This note was prepared with assistance of Dragon voice recognition software. Occasional wrong-word or sound-a-like substitutions may have occurred due to the inherent limitations of voice recognition software  This visit occurred during the SARS-CoV-2 public health emergency.  Safety protocols were in place, including screening questions prior to the visit, additional usage of staff PPE, and extensive cleaning of exam room while observing appropriate contact time as indicated for disinfecting solutions.

## 2019-08-19 NOTE — Patient Instructions (Addendum)
Please return in 6 months for ADD recheck. We will also check your cholesterol and blood pressure at that time as well. Please come fasting.   I have given you your printed prescriptions for your ADD medications. Please keep them in a safe place so you will have them monthly to take to your pharmacy for refills as they come due. I will not be able to reprint these prescriptions so please treat them like money and keep them safe.   I will release your lab results to you on your MyChart account with further instructions. Please reply with any questions.    Please stay off the chlolesterol lowering medication. I will recheck your cholesterol and blood pressure in 6 months.   If you have any questions or concerns, please don't hesitate to send me a message via MyChart or call the office at [phone]. Thank you for visiting with [place] today! It's our pleasure caring for you.

## 2019-09-17 ENCOUNTER — Other Ambulatory Visit: Payer: Self-pay

## 2019-09-17 ENCOUNTER — Emergency Department (HOSPITAL_COMMUNITY): Payer: BC Managed Care – PPO

## 2019-09-17 ENCOUNTER — Emergency Department (HOSPITAL_COMMUNITY)
Admission: EM | Admit: 2019-09-17 | Discharge: 2019-09-18 | Disposition: A | Discharge status: Home / Self Care | Payer: BC Managed Care – PPO | Attending: Emergency Medicine | Admitting: Emergency Medicine

## 2019-09-17 ENCOUNTER — Encounter (HOSPITAL_COMMUNITY): Payer: Self-pay | Admitting: Emergency Medicine

## 2019-09-17 DIAGNOSIS — Y9375 Activity, martial arts: Secondary | ICD-10-CM | POA: Insufficient documentation

## 2019-09-17 DIAGNOSIS — Y9289 Other specified places as the place of occurrence of the external cause: Secondary | ICD-10-CM | POA: Insufficient documentation

## 2019-09-17 DIAGNOSIS — X509XXA Other and unspecified overexertion or strenuous movements or postures, initial encounter: Secondary | ICD-10-CM | POA: Insufficient documentation

## 2019-09-17 DIAGNOSIS — Z79899 Other long term (current) drug therapy: Secondary | ICD-10-CM | POA: Diagnosis not present

## 2019-09-17 DIAGNOSIS — Y999 Unspecified external cause status: Secondary | ICD-10-CM | POA: Diagnosis not present

## 2019-09-17 DIAGNOSIS — S46211A Strain of muscle, fascia and tendon of other parts of biceps, right arm, initial encounter: Secondary | ICD-10-CM | POA: Diagnosis not present

## 2019-09-17 DIAGNOSIS — S4991XA Unspecified injury of right shoulder and upper arm, initial encounter: Secondary | ICD-10-CM | POA: Diagnosis present

## 2019-09-17 IMAGING — CR DG HUMERUS 2V *R*
2 series · 2 of 2 positions shown · non-contrast
Comparison: None.

CLINICAL DATA: Status post trauma.

EXAM:
RIGHT HUMERUS - 2+ VIEW

[humerus ap]
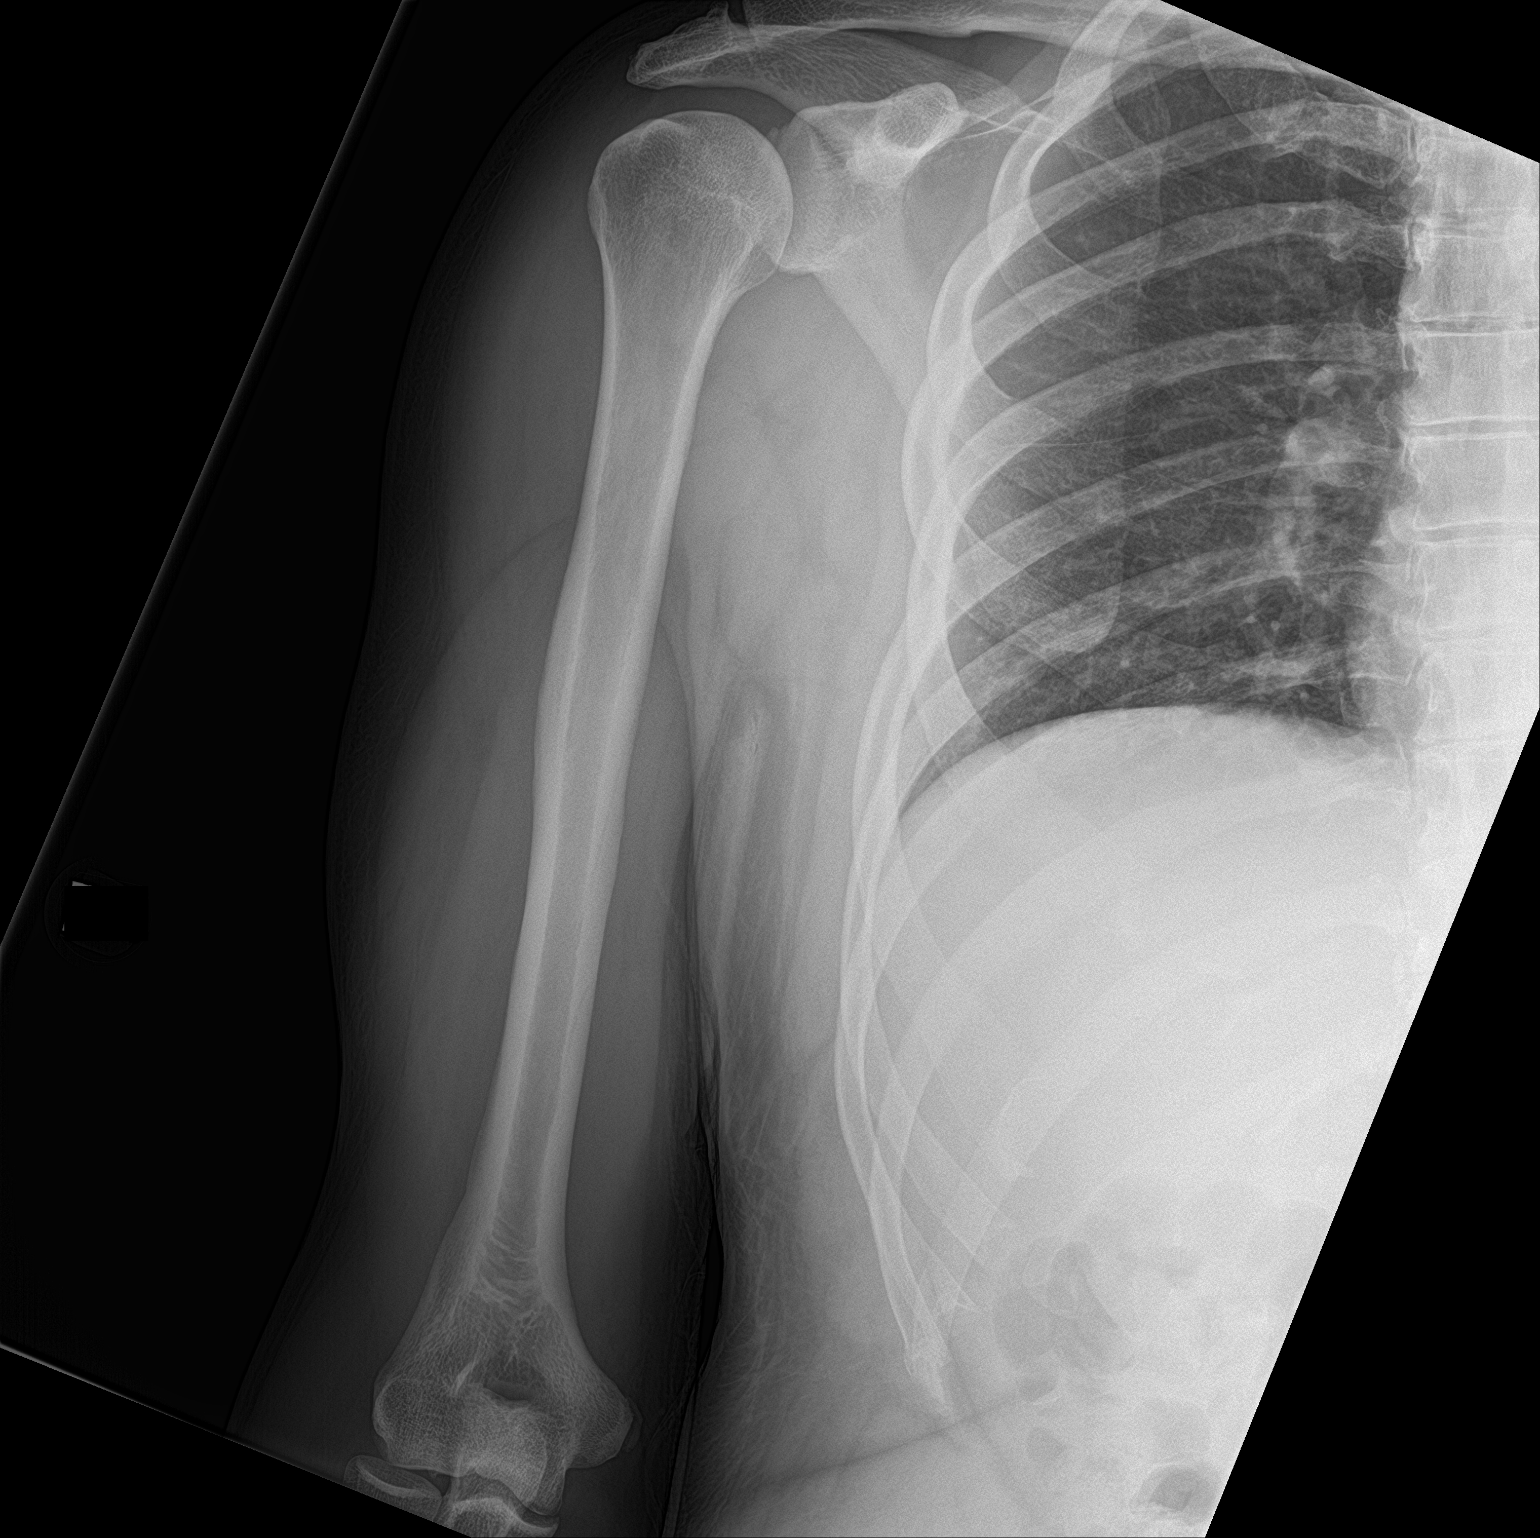

[humerus lat]
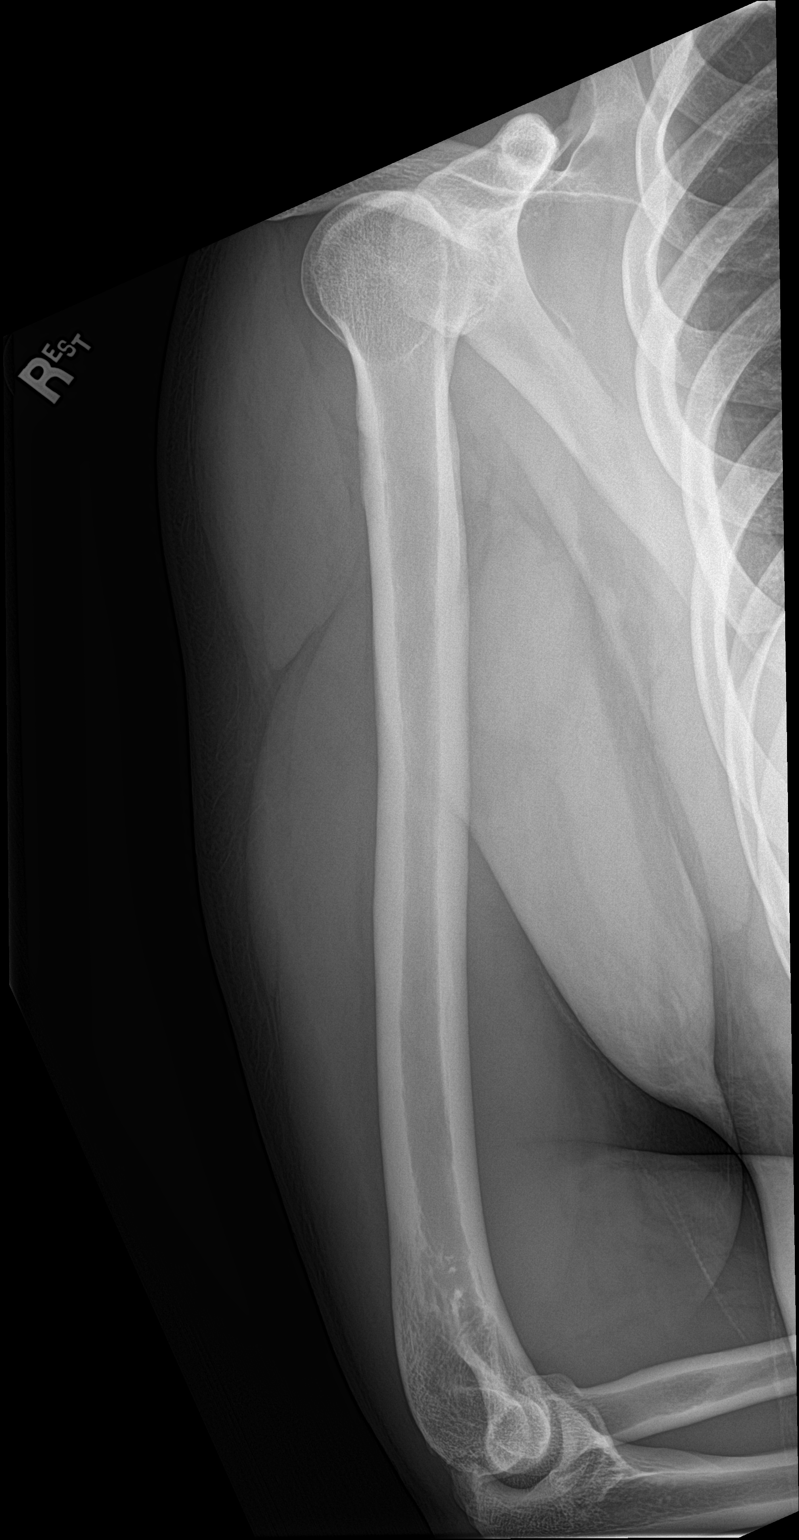

[2 of 2 positions shown; findings below may reference images not displayed]

FINDINGS: There is no evidence of acute fracture or dislocation. A thin,
chronic appearing cortical density is seen adjacent to the medial
epicondyle of the distal right humerus. Soft tissues are
unremarkable.
IMPRESSION: No acute fracture or dislocation.

## 2019-09-17 IMAGING — CR DG ELBOW COMPLETE 3+V*R*
4 series · 4 of 4 positions shown · non-contrast
Comparison: None.

CLINICAL DATA: Status post trauma.

EXAM:
RIGHT ELBOW - COMPLETE 3+ VIEW

[elbow obl (1 of 2)]
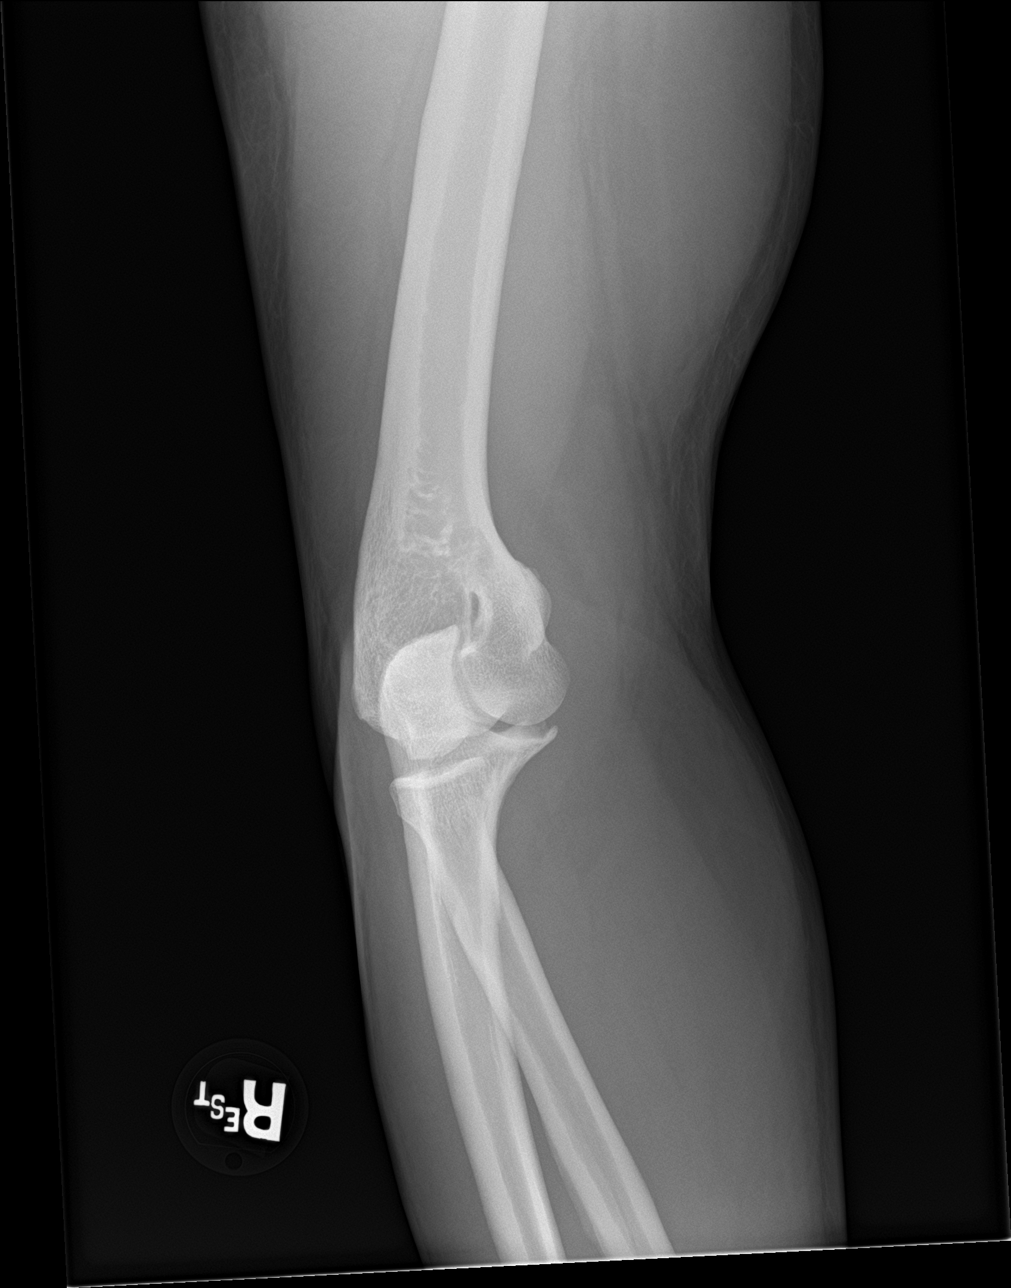

[elbow obl (2 of 2)]
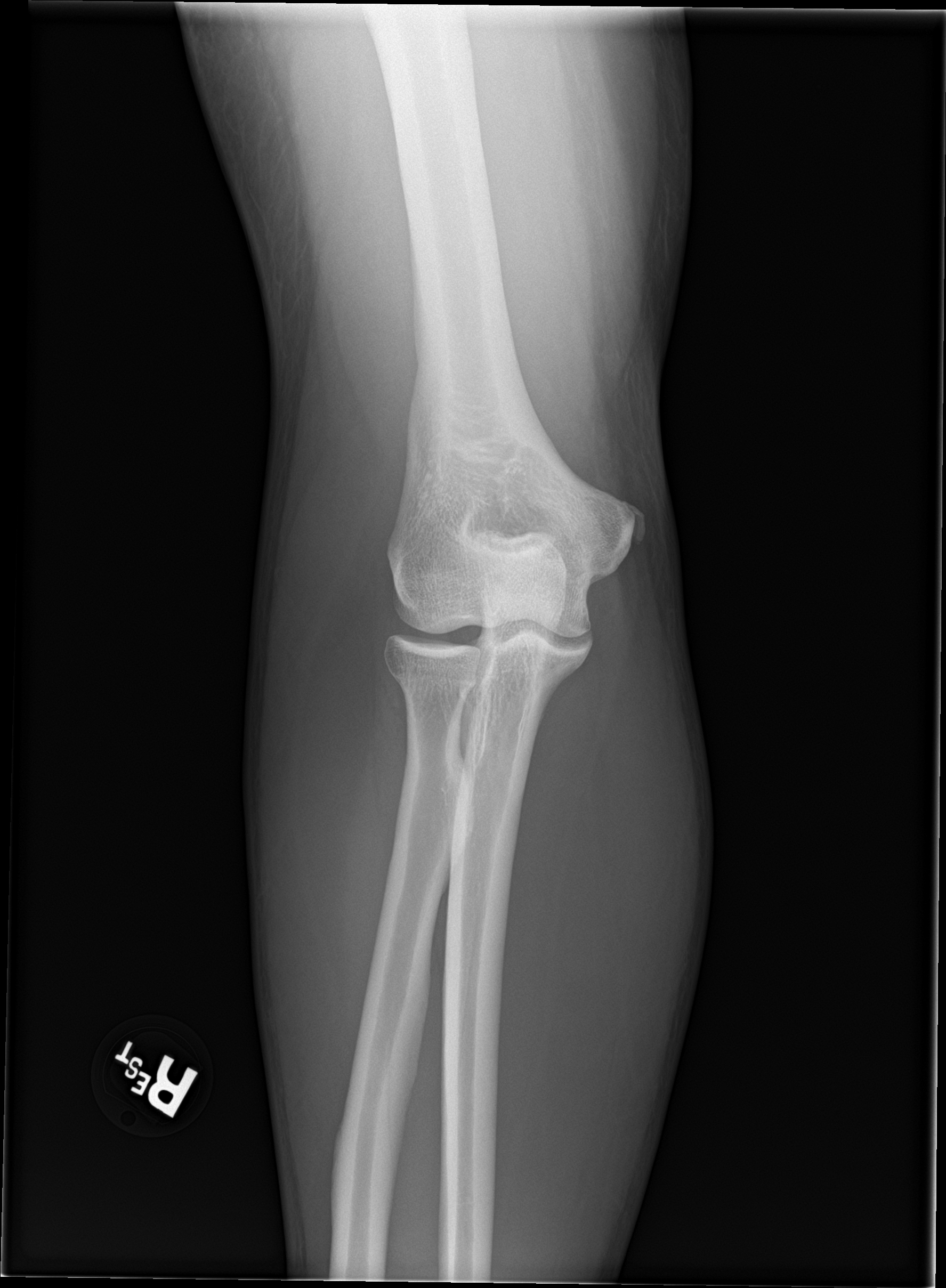

[elbow ap]
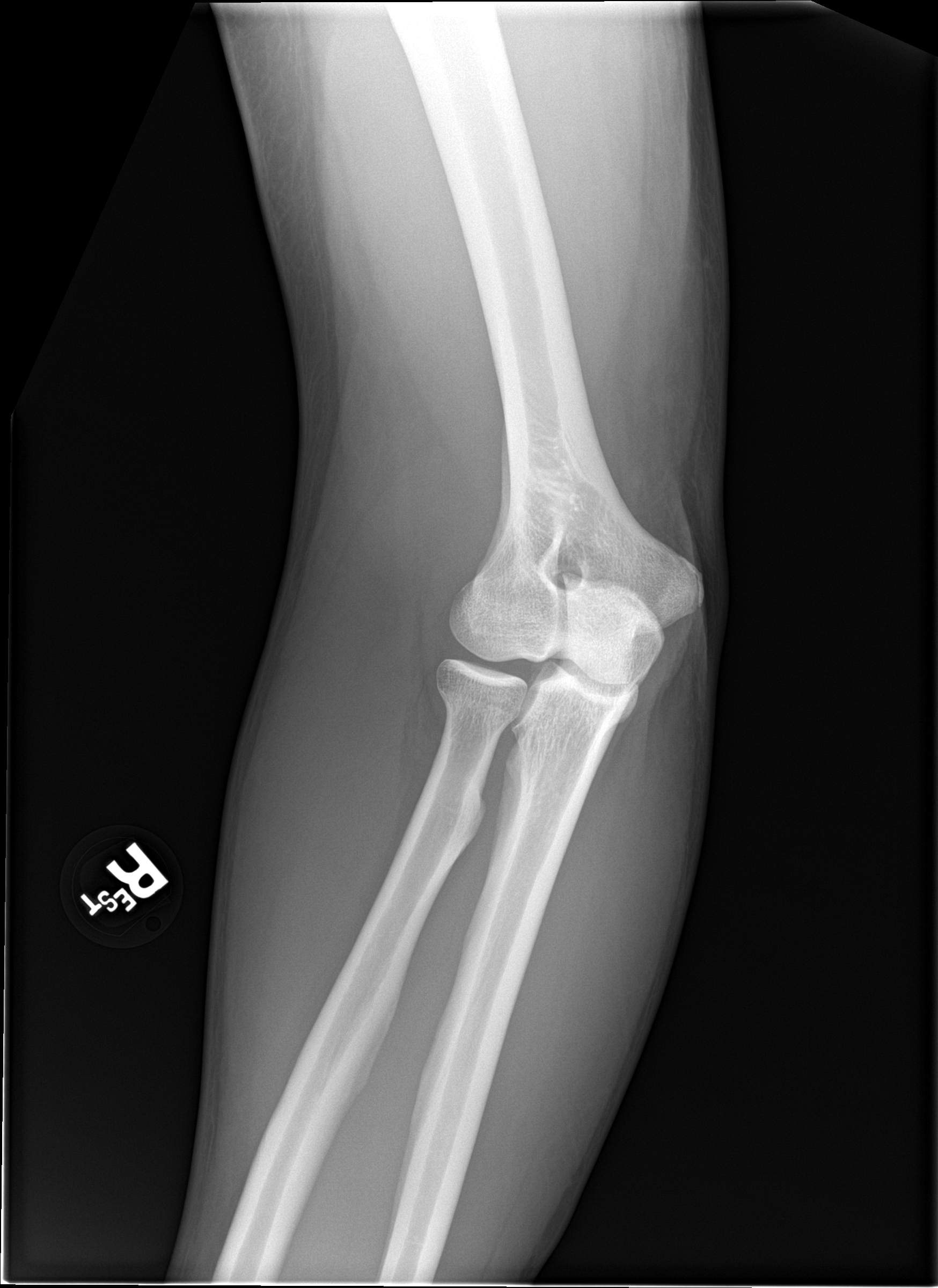

[elbow lat]
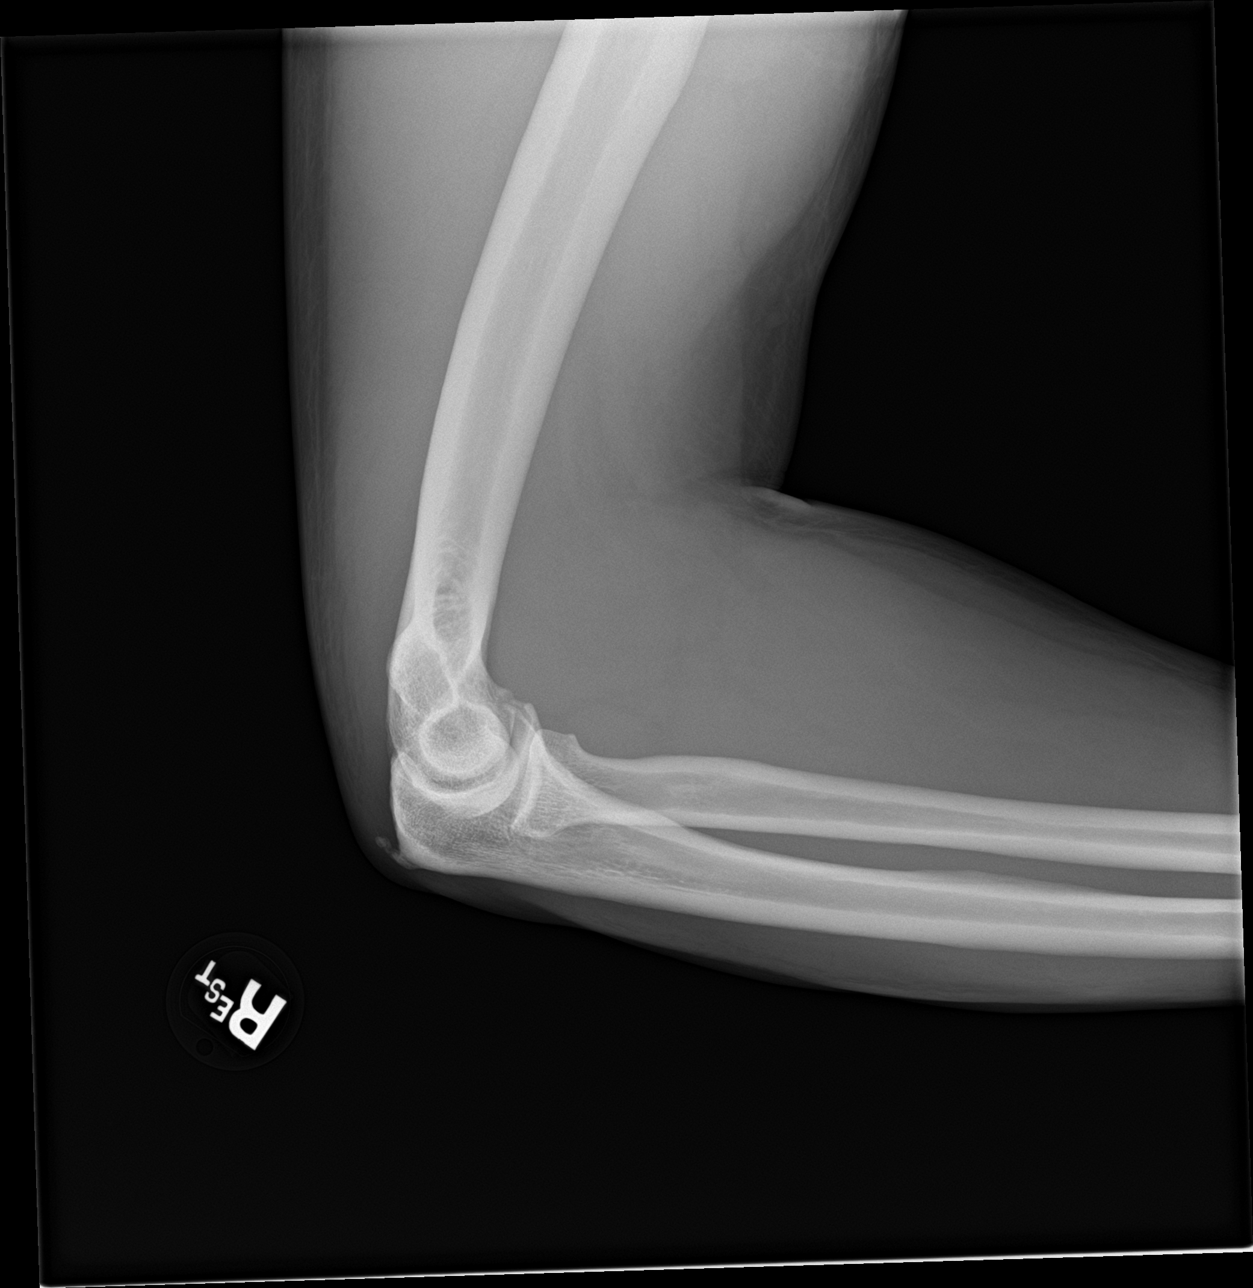

[4 of 4 positions shown; findings below may reference images not displayed]

FINDINGS: A thin curvilinear cortical density is seen adjacent to the medial
epicondyle of the distal right humerus. There is no evidence of
dislocation. There is no evidence of arthropathy or other focal bone
abnormality. Soft tissues are unremarkable.
IMPRESSION: Thin curvilinear cortical density adjacent to the medial epicondyle
of the distal right humerus which may represent a small avulsion
fracture of indeterminate age.

## 2019-09-17 MED ORDER — OXYCODONE-ACETAMINOPHEN 5-325 MG PO TABS
1.0000 | ORAL_TABLET | ORAL | Status: DC | PRN
  Administered 2019-09-17: 1 via ORAL
  Filled 2019-09-17: qty 1

## 2019-09-17 MED ORDER — ONDANSETRON 4 MG PO TBDP
4.0000 mg | ORAL_TABLET | Freq: Once | ORAL | Status: AC
  Administered 2019-09-17: 4 mg via ORAL
  Filled 2019-09-17: qty 1

## 2019-09-17 NOTE — ED Triage Notes (Signed)
Pt reports he was doing Jiu-Jitsu and felt his right bicep "pop several times."  Pt reports "a tear in the past and it feels similar."

## 2019-09-18 MED ORDER — OXYCODONE-ACETAMINOPHEN 5-325 MG PO TABS: 1.0000 | ORAL_TABLET | Freq: Four times a day (QID) | ORAL | 0 refills | Status: DC | PRN

## 2019-09-18 MED ORDER — OXYCODONE-ACETAMINOPHEN 5-325 MG PO TABS
1.0000 | ORAL_TABLET | Freq: Once | ORAL | Status: AC
  Administered 2019-09-18: 1 via ORAL
  Filled 2019-09-18: qty 1

## 2019-09-18 NOTE — Discharge Instructions (Addendum)
At this time there does not appear to be the presence of an emergent medical condition, however there is always the potential for conditions to change. Please read and follow the below instructions.  Please return to the Emergency Department immediately for any new or worsening symptoms. Please be sure to follow up with your Primary Care Provider within one week regarding your visit today; please call their office to schedule an appointment even if you are feeling better for a follow-up visit. Please call the orthopedic specialist Dr. Griffin Basil today to schedule a follow-up appointment for further treatment of your injury.  Please use the sling to protect your arm from further injury. You may use the pain medication Percocet (Oxycodone/Acetaminophen) as prescribed for severe pain.  Percocet will make you drowsy so do not drive, drink alcohol, take other sedating medications or perform any dangerous activities such as driving after taking Percocet.  Percocet contains Tylenol (acetaminophen) so do not take any other medications containing Tylenol with Percocet.  Get help right away if: You develop severe pain. You develop numbness or tingling in your hand. Your hand feels unusually cold. Your fingernails turn a dark color, such as blue or gray. You have fever or chills You have any new/concerning or worsening of symptoms   Please read the additional information packets attached to your discharge summary.  Do not take your medicine if  develop an itchy rash, swelling in your mouth or lips, or difficulty breathing; call 911 and seek immediate emergency medical attention if this occurs.  Note: Portions of this text may have been transcribed using voice recognition software. Every effort was made to ensure accuracy; however, inadvertent computerized transcription errors may still be present.

## 2019-09-18 NOTE — ED Provider Notes (Signed)
[facility] EMERGENCY DEPARTMENT Provider Note   CSN: [id number] Arrival date & time: 09/17/19  2135     History Chief Complaint  Patient presents with  . Arm Pain    Brett [name] is a 44 y.o. male presents today with right arm injury.  Patient was doing jujitsu around 8 PM tonight when he was placed in a right sided arm bar.  He had extension of the right elbow, was fighting the lock when he felt a sudden pop to his right biceps.  He reports that he had a left biceps tear several years ago and that pain to his right bicep today feels very similar.  He describes moderate intensity sharp pain constant worsened with movement at the elbow improved with rest, pain is nonradiating.  Denies head injury, loss of consciousness, neck pain, chest pain, abdominal pain, pain to the other 3 extremities, numbness/tingling, weakness or any additional concerns. HPI     Past Medical History:  Diagnosis Date  . Asthma   . Bipolar 1 disorder ([place]) 06/15/2016   Previously followed by Psychiatry. Hx of "anger issues." Hx of medication use has included Seroquel and Ativan, both of which have helped him in the past.   . Chronic pain of both knees 06/15/2016  . Chronic seasonal allergic rhinitis due to pollen 06/15/2016  . Depression   . Gastroesophageal reflux disease without esophagitis 06/15/2016  . History of diabetes mellitus 05/17/2019   Resolved after > 100 pound weight loss and lifestyle changes. 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   . Hypertension   . Insomnia 06/15/2016  . Left rotator cuff tear arthropathy 06/15/2016   s/p repair and PT. No ongoing issues.  . Morbid obesity ([place]) 06/15/2016    Patient Active Problem List   Diagnosis Date Noted  . Vitamin B12 deficiency 05/17/2019  . History of diabetes mellitus 05/17/2019  . Attention deficit hyperactivity disorder (ADHD), combined type 09/27/2017  . Unilateral primary osteoarthritis, left knee  07/10/2017  . Spondylolisthesis, lumbosacral region 07/10/2017  . Chronic midline low back pain without sciatica 07/10/2017  . Hypogonadism in male 05/20/2017  . Bipolar 2 disorder ([place]) 06/15/2016  . Left rotator cuff tear arthropathy, s/p PT and repair 06/15/2016  . Chronic seasonal allergic rhinitis due to pollen 06/15/2016  . Peripheral polyneuropathy 06/15/2016  . Insomnia 06/15/2016  . Moderate persistent asthma     Past Surgical History:  Procedure Laterality Date  . BICEPS TENDON REPAIR    . PANNICULECTOMY  11/15/2018   loose skin removal surgery       No family history on file.  Social History   Tobacco Use  . Smoking status: Never Smoker  . Smokeless tobacco: Never Used  Substance Use Topics  . Alcohol use: No  . Drug use: No    Home Medications Prior to Admission medications   Medication Sig Start Date End Date Taking? Authorizing Provider  albuterol (VENTOLIN HFA) 108 (90 Base) MCG/ACT inhaler INHALE 2 PUFFS BY MOUTH EVERY 6 HOURS AS NEEDED FOR WHEEZE OR SHORTNESS OF BREATH 07/19/19   [name], MD  buPROPion (WELLBUTRIN XL) 150 MG 24 hr tablet TAKE 1 TABLET (150 MG TOTAL) BY MOUTH DAILY 04/08/19   [name], MD  Calcium Carb-Cholecalciferol (CALCIUM 1000 + D) 1000-800 MG-UNIT TABS     [provider]  celecoxib (CELEBREX) 100 MG capsule Take 1 capsule (100 mg total) by mouth 2 (two) times daily as needed. 04/04/19   Jonni Sanger,  Karie Fetch, MD  cyanocobalamin 1000 MCG tablet Take 1,000 mcg by mouth daily.    [provider]  Insulin Pen Needle 32G X 4 MM MISC Use 1 needle per day as directed 02/28/19   [name], MD  Liraglutide -Weight Management (SAXENDA) 18 MG/3ML SOPN Inject 0.2 mLs (1.2 mg total) into the skin daily. 07/30/19   [name], MD  lisdexamfetamine (VYVANSE) 60 MG capsule Take 1 capsule (60 mg total) by mouth every morning. 01/19/20   [name], MD  MAGNESIUM CITRATE PO Take 1 tablet by mouth every evening.     [provider]  methocarbamol (ROBAXIN-750) 750 MG tablet Take 1 tablet (750 mg total) by mouth 3 (three) times daily as needed for muscle spasms. 02/12/19   [name], MD  Potassium Gluconate 550 MG TABS     [provider]  pregabalin (LYRICA) 200 MG capsule Take 1 capsule (200 mg total) by mouth 2 (two) times daily. 06/19/19   [name], MD  SYRINGE-NEEDLE, DISP, 3 ML (B-D 3CC LUER-LOK SYR 22GX1-1/2) 22G X 1-1/2" 3 ML MISC 1 each by Other route once a week. 04/08/19   [name], MD  testosterone cypionate (DEPOTESTOSTERONE CYPIONATE) 200 MG/ML injection Inject 1 mL (200 mg total) into the muscle every 14 (fourteen) days. 06/03/19   [name], MD    Allergies    Patient has no known allergies.  Review of Systems   Review of Systems  Constitutional: Negative for chills and fever.  Cardiovascular: Negative.  Negative for chest pain.  Gastrointestinal: Negative.  Negative for abdominal pain.  Musculoskeletal: Positive for myalgias (Right biceps). Negative for back pain and neck pain.  Skin: Negative.  Negative for color change.  Neurological: Negative for weakness and numbness.    Physical Exam Updated Vital Signs BP (!) 144/88 (BP Location: Left Arm)   Pulse 85   Temp 98.6 F (37 C) (Oral)   Resp 16   Ht 6' (1.829 m)   Wt 98 kg   SpO2 100%   BMI 29.29 kg/m   Physical Exam Constitutional:      General: He is not in acute distress.    Appearance: Normal appearance. He is well-developed. He is not ill-appearing or diaphoretic.  HENT:     Head: Normocephalic and atraumatic.  Eyes:     General: Vision grossly intact. Gaze aligned appropriately.     Pupils: Pupils are equal, round, and reactive to light.  Neck:     Trachea: Trachea and phonation normal. No tracheal tenderness.  Cardiovascular:     Rate and Rhythm: Normal rate and regular rhythm.     Pulses:          Radial pulses are 2+ on the right side and 2+ on the left side.    Pulmonary:     Effort: Pulmonary effort is normal. No respiratory distress.  Abdominal:     General: There is no distension.     Palpations: Abdomen is soft.     Tenderness: There is no abdominal tenderness. There is no guarding or rebound.  Musculoskeletal:        General: Normal range of motion.     Right shoulder: No deformity or tenderness. Normal range of motion.     Right upper arm: Tenderness present.     Right elbow: Normal range of motion. No tenderness.     Right forearm: Normal.     Right wrist: Normal.  Right hand: Normal.     Cervical back: Normal range of motion and neck supple. No spinous process tenderness or muscular tenderness.     Comments: Shortening at the right biceps consistent with tear.  No overlying skin changes.  Appropriate range of motion of the right shoulder without pain.  Flexion at the right elbow increases pain along with supination/pronation.  Passive range of motion at the right elbow is intact without deformity, minimal increase in pain.  Strong and equal radial pulses.  Capillary refill and sensation intact to all fingers.  Compartments are soft.  Skin:    General: Skin is warm and dry.  Neurological:     Mental Status: He is alert.     GCS: GCS eye subscore is 4. GCS verbal subscore is 5. GCS motor subscore is 6.     Comments: Speech is clear and goal oriented, follows commands Major Cranial nerves without deficit, no facial droop Moves extremities without ataxia, coordination intact Strong and equal grip strength.  Normal gait.  Psychiatric:        Behavior: Behavior normal.     ED Results / Procedures / Treatments   Labs (all labs ordered are listed, but only abnormal results are displayed) Labs Reviewed - No data to display  EKG None  Radiology DG Elbow Complete Right  Result Date: 09/17/2019 CLINICAL DATA:  Status post trauma. EXAM: RIGHT ELBOW - COMPLETE 3+ VIEW COMPARISON:  None. FINDINGS: A thin curvilinear cortical density  is seen adjacent to the medial epicondyle of the distal right humerus. There is no evidence of dislocation. There is no evidence of arthropathy or other focal bone abnormality. Soft tissues are unremarkable. IMPRESSION: Thin curvilinear cortical density adjacent to the medial epicondyle of the distal right humerus which may represent a small avulsion fracture of indeterminate age. Electronically Signed   By: Virgina Norfolk M.D.   On: 09/17/2019 23:13   DG Humerus Right  Result Date: 09/17/2019 CLINICAL DATA:  Status post trauma. EXAM: RIGHT HUMERUS - 2+ VIEW COMPARISON:  None. FINDINGS: There is no evidence of acute fracture or dislocation. A thin, chronic appearing cortical density is seen adjacent to the medial epicondyle of the distal right humerus. Soft tissues are unremarkable. IMPRESSION: No acute fracture or dislocation. Electronically Signed   By: Virgina Norfolk M.D.   On: 09/17/2019 23:11    Procedures Procedures (including critical care time)  Medications Ordered in ED Medications  ondansetron (ZOFRAN-ODT) disintegrating tablet 4 mg (4 mg Oral Given 09/17/19 2225)  oxyCODONE-acetaminophen (PERCOCET/ROXICET) 5-325 MG per tablet 1 tablet (1 tablet Oral Given 09/18/19 0152)    ED Course  I have reviewed the triage vital signs and the nursing notes.  Pertinent labs & imaging results that were available during my care of the patient were reviewed by me and considered in my medical decision making (see chart for details).    MDM Rules/Calculators/A&P                         44 year old male presents today with right biceps pain after being placed in an arm bar at his jujitsu class.  Physical examination is consistent with a right biceps tear.  Neurovascular intact distal to injury.  No evidence of compartment syndrome, cellulitis, skin break, septic arthritis, DVT or neurovascular compromise.  Good range of motion of the right shoulder without pain.  Flexion at the right elbow elicits  increased pain.  X-rays of the right humerus and  elbow were obtained in triage.  DG Right Elbow:  IMPRESSION:  Thin curvilinear cortical density adjacent to the medial epicondyle  of the distal right humerus which may represent a small avulsion  fracture of indeterminate age.   DG Right Humerus:  IMPRESSION:  No acute fracture or dislocation.  - I personally reviewed patient's x-rays and agree with radiologist interpretation.  Patient was placed in sling to protect extremity and referred to orthopedics and encouraged to call tomorrow to schedule follow-up appointment.  Patient was given Percocet in the ER for pain control.  Patient's wife is driving home today.  Patient was given prescription for 5 (five) pills Percocet for pain secondary to acute biceps injury.  He states understanding of narcotic precautions and has no questions.  I reviewed PDMP, patient without recent opiate prescription, his prescriptions for Vyvanse and testosterone recently.  At this time there does not appear to be any evidence of an acute emergency medical condition and the patient appears stable for discharge with appropriate outpatient follow up. Diagnosis was discussed with patient who verbalizes understanding of care plan and is agreeable to discharge. I have discussed return precautions with patient who verbalizes understanding. Patient encouraged to follow-up with their PCP. All questions answered.  Patient's case discussed with Dr. Dina Rich who agrees with plan to discharge with follow-up.   Note: Portions of this report may have been transcribed using voice recognition software. Every effort was made to ensure accuracy; however, inadvertent computerized transcription errors may still be present. Final Clinical Impression(s) / ED Diagnoses Final diagnoses:  Tear of right biceps muscle, initial encounter    Rx / DC Orders ED Discharge Orders         Ordered    oxyCODONE-acetaminophen (PERCOCET/ROXICET) 5-325  MG tablet  Every 6 hours PRN     Discontinue  Reprint     09/18/19 0154           Deliah Boston, PA-C 09/18/19 2035    Merryl Hacker, MD 09/18/19 [phone]

## 2019-09-27 ENCOUNTER — Other Ambulatory Visit: Payer: Self-pay | Admitting: Family Medicine

## 2019-09-27 DIAGNOSIS — M2550 Pain in unspecified joint: Secondary | ICD-10-CM

## 2019-10-09 ENCOUNTER — Other Ambulatory Visit: Payer: Self-pay | Admitting: Family Medicine

## 2019-10-13 ENCOUNTER — Other Ambulatory Visit: Payer: Self-pay | Admitting: Family Medicine

## 2019-10-13 DIAGNOSIS — J4530 Mild persistent asthma, uncomplicated: Secondary | ICD-10-CM

## 2019-12-20 ENCOUNTER — Other Ambulatory Visit: Payer: Self-pay | Admitting: Family Medicine

## 2019-12-20 DIAGNOSIS — G629 Polyneuropathy, unspecified: Secondary | ICD-10-CM

## 2019-12-20 NOTE — Telephone Encounter (Signed)
Last refill: 06/19/19 #180, 3 Last OV: 08/19/19 dx. ADHD

## 2020-01-20 ENCOUNTER — Encounter: Payer: Self-pay | Admitting: Family Medicine

## 2020-02-11 ENCOUNTER — Other Ambulatory Visit: Payer: Self-pay | Admitting: Family Medicine

## 2020-02-11 DIAGNOSIS — E291 Testicular hypofunction: Secondary | ICD-10-CM

## 2020-02-17 ENCOUNTER — Ambulatory Visit: Payer: BC Managed Care – PPO | Admitting: Family Medicine

## 2020-02-24 ENCOUNTER — Ambulatory Visit: Payer: BC Managed Care – PPO | Admitting: Family Medicine

## 2020-03-05 ENCOUNTER — Other Ambulatory Visit: Payer: Self-pay

## 2020-03-05 ENCOUNTER — Encounter: Payer: Self-pay | Admitting: Family Medicine

## 2020-03-05 ENCOUNTER — Telehealth (INDEPENDENT_AMBULATORY_CARE_PROVIDER_SITE_OTHER): Payer: BC Managed Care – PPO | Admitting: Family Medicine

## 2020-03-05 DIAGNOSIS — F5101 Primary insomnia: Secondary | ICD-10-CM

## 2020-03-05 DIAGNOSIS — F902 Attention-deficit hyperactivity disorder, combined type: Secondary | ICD-10-CM | POA: Diagnosis not present

## 2020-03-05 DIAGNOSIS — M791 Myalgia, unspecified site: Secondary | ICD-10-CM

## 2020-03-05 MED ORDER — LISDEXAMFETAMINE DIMESYLATE 60 MG PO CAPS: 60.0000 mg | ORAL_CAPSULE | ORAL | 0 refills | Status: DC

## 2020-03-05 MED ORDER — METHOCARBAMOL 750 MG PO TABS: 750.0000 mg | ORAL_TABLET | Freq: Two times a day (BID) | ORAL | 2 refills | Status: DC | PRN

## 2020-03-05 NOTE — Progress Notes (Signed)
TELEPHONE ENCOUNTER   Patient verbally agreed to telephone visit and is aware that copayment and coinsurance may apply. Patient was treated using telemedicine according to accepted telemedicine protocols.  Location of the patient: home Location of provider: [facility], [place] of all persons participating in the telemedicine service and role in the encounter: [name], MD Maryelizabeth Kaufmann, CMA   Subjective  CC:  Chief Complaint  Patient presents with  . ADHD  . Health Maintenance    Has not received his covid vaccine or flu shot this year.     HPI: Brett [name] is a 44 y.o. male who was telephoned today to address the problems listed above in the chief complaint.  ADD follow-up: Using Vyvanse daily.  Symptoms are well controlled.  He has chronic primary insomnia and is failed multiple oral medications over the years.  He does not feel that the ADD medicines worsen his sleep.  Insomnia: Worsening in part due to his inability to exercise as vigorously as he typically does due to a healing right shoulder.  He gets muscle pain from rigorous exercise.  Request refill of muscle relaxer   ASSESSMENT: 1. Attention deficit hyperactivity disorder (ADHD), combined type   2. Primary insomnia   3. Muscle pain      ADD:  I reviewed patient's records from the PMP aware controlled substance registry today.  Appropriate use of medications.  Refill today for 3 months.  Due office visit.  Primary insomnia: Worsening due to difficulty with exercise regimen.  He has failed multiple medications in the past.  Will monitor for now.  Muscle pain: Workout related.  Muscle relaxer as needed.  He reports he uses 1-2 about twice weekly.  Due for complete physical in February Time spent with the patient (non face-to-face time during this virtual encounter): 25 minutes, spent in obtaining information about his symptoms, reviewing his previous labs, evaluations, and  treatments, counseling him about his condition (please see the discussed topics above), and developing a plan to further investigate it; the patient was provided an opportunity to ask questions and all were answered. The patient agreed with the plan and demonstrated an understanding of the instructions.   The patient was advised to call back or seek an in-person evaluation if the symptoms worsen or if the condition fails to improve as anticipated.  Follow up: Complete physical and ADD follow-up Visit date not found  No orders of the defined types were placed in this encounter.  Meds ordered this encounter  Medications  . lisdexamfetamine (VYVANSE) 60 MG capsule    Sig: Take 1 capsule (60 mg total) by mouth every morning.    Dispense:  30 capsule    Refill:  0    Please keep on file for next refill due January  . lisdexamfetamine (VYVANSE) 60 MG capsule    Sig: Take 1 capsule (60 mg total) by mouth every morning.    Dispense:  30 capsule    Refill:  0  . methocarbamol (ROBAXIN-750) 750 MG tablet    Sig: Take 1 tablet (750 mg total) by mouth 2 (two) times daily as needed for muscle spasms.    Dispense:  90 tablet    Refill:  2  . lisdexamfetamine (VYVANSE) 60 MG capsule    Sig: Take 1 capsule (60 mg total) by mouth every morning.    Dispense:  30 capsule    Refill:  0    Please keep on file  for next refill due February     I reviewed the patients updated PMH, FH, and SocHx.    Patient Active Problem List   Diagnosis Date Noted  . Attention deficit hyperactivity disorder (ADHD), combined type 09/27/2017    Priority: High  . Bipolar 2 disorder ([place]) 06/15/2016    Priority: High  . Peripheral polyneuropathy 06/15/2016    Priority: High  . Insomnia 06/15/2016    Priority: High  . Unilateral primary osteoarthritis, left knee 07/10/2017    Priority: Medium  . Chronic midline low back pain without sciatica 07/10/2017    Priority: Medium  . Hypogonadism in male 05/20/2017     Priority: Medium  . Moderate persistent asthma     Priority: Medium  . Vitamin B12 deficiency 05/17/2019    Priority: Low  . Spondylolisthesis, lumbosacral region 07/10/2017    Priority: Low  . Left rotator cuff tear arthropathy, s/p PT and repair 06/15/2016    Priority: Low  . Chronic seasonal allergic rhinitis due to pollen 06/15/2016    Priority: Low  . History of diabetes mellitus 05/17/2019   Current Meds  Medication Sig  . albuterol (VENTOLIN HFA) 108 (90 Base) MCG/ACT inhaler INHALE 2 PUFFS BY MOUTH EVERY 6 HOURS AS NEEDED FOR WHEEZE OR SHORTNESS OF BREATH  . buPROPion (WELLBUTRIN XL) 150 MG 24 hr tablet TAKE 1 TABLET (150 MG TOTAL) BY MOUTH DAILY  . Calcium Carb-Cholecalciferol 1000-800 MG-UNIT TABS   . celecoxib (CELEBREX) 100 MG capsule TAKE 1 CAPSULE BY MOUTH TWICE A DAY AS NEEDED  . cyanocobalamin 1000 MCG tablet Take 1,000 mcg by mouth daily.  . Insulin Pen Needle 32G X 4 MM MISC Use 1 needle per day as directed  . MAGNESIUM CITRATE PO Take 1 tablet by mouth every evening.  . Potassium Gluconate 550 MG TABS   . pregabalin (LYRICA) 200 MG capsule TAKE 1 CAPSULE BY MOUTH TWICE A DAY  . SAXENDA 18 MG/3ML SOPN INJECT 0.6 MG INTO THE SKIN DAILY FOR 7 DAYS, THEN 1.2 MG DAILY FOR 21 DAYS.  . SYRINGE-NEEDLE, DISP, 3 ML (B-D 3CC LUER-LOK SYR 22GX1-1/2) 22G X 1-1/2" 3 ML MISC 1 each by Other route once a week.  . testosterone cypionate (DEPOTESTOSTERONE CYPIONATE) 200 MG/ML injection INJECT 1 ML (200 MG TOTAL) INTO THE MUSCLE EVERY 14 (FOURTEEN) DAYS.  . [DISCONTINUED] lisdexamfetamine (VYVANSE) 60 MG capsule Take 1 capsule (60 mg total) by mouth every morning.    Allergies: Patient has No Known Allergies. Family History: Patient family history is not on file. Social History:  Patient  reports that he has never smoked. He has never used smokeless tobacco. He reports that he does not drink alcohol and does not use drugs.  Review of Systems: Constitutional: Negative for fever  malaise or anorexia Cardiovascular: negative for chest pain Respiratory: negative for SOB or persistent cough Gastrointestinal: negative for abdominal pain

## 2020-03-15 ENCOUNTER — Other Ambulatory Visit: Payer: Self-pay | Admitting: Family Medicine

## 2020-03-15 DIAGNOSIS — F3181 Bipolar II disorder: Secondary | ICD-10-CM

## 2020-04-02 ENCOUNTER — Encounter: Payer: Self-pay | Admitting: Family Medicine

## 2020-04-11 ENCOUNTER — Other Ambulatory Visit: Payer: Self-pay | Admitting: Family Medicine

## 2020-04-11 DIAGNOSIS — J4530 Mild persistent asthma, uncomplicated: Secondary | ICD-10-CM

## 2020-04-12 ENCOUNTER — Encounter: Payer: Self-pay | Admitting: Family Medicine

## 2020-04-15 NOTE — Telephone Encounter (Signed)
And please check what the reason was? Is it something we can fix? /correct.

## 2020-04-15 NOTE — Telephone Encounter (Signed)
Can try to refer to urology to see if they can get it for him.

## 2020-05-11 ENCOUNTER — Encounter: Payer: Self-pay | Admitting: Family Medicine

## 2020-05-11 ENCOUNTER — Other Ambulatory Visit: Payer: Self-pay | Admitting: Family Medicine

## 2020-05-11 DIAGNOSIS — M2550 Pain in unspecified joint: Secondary | ICD-10-CM

## 2020-05-31 ENCOUNTER — Other Ambulatory Visit: Payer: Self-pay | Admitting: Family Medicine

## 2020-05-31 DIAGNOSIS — J4530 Mild persistent asthma, uncomplicated: Secondary | ICD-10-CM

## 2020-06-05 ENCOUNTER — Telehealth: Payer: Self-pay

## 2020-06-05 NOTE — Telephone Encounter (Signed)
Brett [name] has been denied 3 times by [facility]. Are you able to write a letter of medical necessity for me to mail into the appeals department?

## 2020-06-05 NOTE — Telephone Encounter (Signed)
Testosterone Cypionate IM injections approved for 36 months.   Ref # [id number]

## 2020-06-08 NOTE — Telephone Encounter (Signed)
Was using for chronic weight management.  Has complete physical next week.  Can discuss at that time.

## 2020-06-08 NOTE — Telephone Encounter (Signed)
If his BMI is now below 30, there is no way to get approved. Is this the reason it was denied?

## 2020-06-08 NOTE — Telephone Encounter (Signed)
His BMI is at 30.6

## 2020-06-22 ENCOUNTER — Encounter: Payer: Self-pay | Admitting: Family Medicine

## 2020-06-22 ENCOUNTER — Other Ambulatory Visit: Payer: Self-pay

## 2020-06-22 DIAGNOSIS — G629 Polyneuropathy, unspecified: Secondary | ICD-10-CM

## 2020-06-22 MED ORDER — PREGABALIN 200 MG PO CAPS: 200.0000 mg | ORAL_CAPSULE | Freq: Two times a day (BID) | ORAL | 1 refills | Status: DC

## 2020-06-22 MED ORDER — LISDEXAMFETAMINE DIMESYLATE 60 MG PO CAPS: 60.0000 mg | ORAL_CAPSULE | ORAL | 0 refills | Status: DC

## 2020-06-22 NOTE — Telephone Encounter (Signed)
Vyvanse: 05/04/20 #30, 0 Lyrica: 12/20/19 #180, 3 - expired  Last OV: 03/05/20 dx. ADHD

## 2020-06-23 ENCOUNTER — Encounter: Payer: Self-pay | Admitting: Family Medicine

## 2020-06-24 ENCOUNTER — Other Ambulatory Visit: Payer: Self-pay

## 2020-06-24 ENCOUNTER — Ambulatory Visit (INDEPENDENT_AMBULATORY_CARE_PROVIDER_SITE_OTHER): Payer: BC Managed Care – PPO | Admitting: Family Medicine

## 2020-06-24 ENCOUNTER — Encounter: Payer: Self-pay | Admitting: Family Medicine

## 2020-06-24 VITALS — BP 124/88 | HR 91 | Temp 97.9°F | Resp 17 | Ht 72.0 in | Wt 219.0 lb

## 2020-06-24 DIAGNOSIS — G629 Polyneuropathy, unspecified: Secondary | ICD-10-CM | POA: Diagnosis not present

## 2020-06-24 DIAGNOSIS — I10 Essential (primary) hypertension: Secondary | ICD-10-CM

## 2020-06-24 DIAGNOSIS — Z8639 Personal history of other endocrine, nutritional and metabolic disease: Secondary | ICD-10-CM

## 2020-06-24 DIAGNOSIS — F5101 Primary insomnia: Secondary | ICD-10-CM | POA: Diagnosis not present

## 2020-06-24 DIAGNOSIS — F902 Attention-deficit hyperactivity disorder, combined type: Secondary | ICD-10-CM | POA: Diagnosis not present

## 2020-06-24 DIAGNOSIS — M545 Low back pain, unspecified: Secondary | ICD-10-CM | POA: Diagnosis not present

## 2020-06-24 DIAGNOSIS — Z0000 Encounter for general adult medical examination without abnormal findings: Secondary | ICD-10-CM

## 2020-06-24 DIAGNOSIS — G8929 Other chronic pain: Secondary | ICD-10-CM

## 2020-06-24 DIAGNOSIS — E291 Testicular hypofunction: Secondary | ICD-10-CM

## 2020-06-24 DIAGNOSIS — E538 Deficiency of other specified B group vitamins: Secondary | ICD-10-CM

## 2020-06-24 DIAGNOSIS — F3181 Bipolar II disorder: Secondary | ICD-10-CM

## 2020-06-24 LAB — LIPID PANEL
Cholesterol: 175 mg/dL (ref 0–200)
HDL: 40.9 mg/dL (ref >39.00)
LDL Cholesterol: 115 mg/dL — ABNORMAL HIGH (ref 0–99)
NonHDL: 133.85
Total CHOL/HDL Ratio: 4
Triglycerides: 95 mg/dL (ref 0.0–149.0)
VLDL: 19 mg/dL (ref 0.0–40.0)

## 2020-06-24 LAB — COMPREHENSIVE METABOLIC PANEL
ALT: 23 U/L (ref 0–53)
AST: 22 U/L (ref 0–37)
Albumin: 5 g/dL (ref 3.5–5.2)
Alkaline Phosphatase: 36 U/L — ABNORMAL LOW (ref 39–117)
BUN: 12 mg/dL (ref 6–23)
CO2: 31 mEq/L (ref 19–32)
Calcium: 9.7 mg/dL (ref 8.4–10.5)
Chloride: 99 mEq/L (ref 96–112)
Creatinine, Ser: 0.92 mg/dL (ref 0.40–1.50)
GFR: 100.48 mL/min (ref >60.00)
Glucose, Bld: 102 mg/dL — ABNORMAL HIGH (ref 70–99)
Potassium: 4.5 mEq/L (ref 3.5–5.1)
Sodium: 137 mEq/L (ref 135–145)
Total Bilirubin: 1.2 mg/dL (ref 0.2–1.2)
Total Protein: 7.6 g/dL (ref 6.0–8.3)

## 2020-06-24 LAB — CBC WITH DIFFERENTIAL/PLATELET
Basophils Absolute: 0 10*3/uL (ref 0.0–0.1)
Basophils Relative: 0.8 % (ref 0.0–3.0)
Eosinophils Absolute: 0.1 10*3/uL (ref 0.0–0.7)
Eosinophils Relative: 1.6 % (ref 0.0–5.0)
HCT: 52.2 % — ABNORMAL HIGH (ref 39.0–52.0)
Hemoglobin: 17.4 g/dL — ABNORMAL HIGH (ref 13.0–17.0)
Lymphocytes Relative: 28.1 % (ref 12.0–46.0)
Lymphs Abs: 1.4 10*3/uL (ref 0.7–4.0)
MCHC: 33.4 g/dL (ref 30.0–36.0)
MCV: 89.8 fl (ref 78.0–100.0)
Monocytes Absolute: 0.5 10*3/uL (ref 0.1–1.0)
Monocytes Relative: 10.7 % (ref 3.0–12.0)
Neutro Abs: 3 10*3/uL (ref 1.4–7.7)
Neutrophils Relative %: 58.8 % (ref 43.0–77.0)
Platelets: 127 10*3/uL — ABNORMAL LOW (ref 150.0–400.0)
RBC: 5.82 Mil/uL — ABNORMAL HIGH (ref 4.22–5.81)
RDW: 14.1 % (ref 11.5–15.5)
WBC: 5.1 10*3/uL (ref 4.0–10.5)

## 2020-06-24 LAB — TSH: TSH: 2.27 u[IU]/mL (ref 0.35–4.50)

## 2020-06-24 LAB — VITAMIN B12: Vitamin B-12: >1506 pg/mL — ABNORMAL HIGH (ref 211–911)

## 2020-06-24 MED ORDER — TRAZODONE HCL 50 MG PO TABS
50.0000 mg | ORAL_TABLET | Freq: Every evening | ORAL | 3 refills | Status: DC | PRN
Start: 2020-06-24 — End: 2021-06-22

## 2020-06-24 MED ORDER — BUPROPION HCL ER (XL) 300 MG PO TB24: 300.0000 mg | ORAL_TABLET | Freq: Every day | ORAL | 3 refills | Status: DC

## 2020-06-24 MED ORDER — LISDEXAMFETAMINE DIMESYLATE 60 MG PO CAPS: 60.0000 mg | ORAL_CAPSULE | ORAL | 0 refills | Status: DC

## 2020-06-24 NOTE — Patient Instructions (Signed)
Please return in 3 months to recheck sleep, mood and ADD  I have increased your wellbutrin from 150mg  daily to 300mg  daily to see if this helps.  I have refilled your Vyvanse for 3 months.  Please start trazadone at night to see if helps with sleep. We can increase the dose if needed.   I will release your lab results to you on your MyChart account with further instructions. Please reply with any questions.    If you have any questions or concerns, please don't hesitate to send me a message via MyChart or call the office at [phone]. Thank you for visiting with [place] today! It's our pleasure caring for you.   Insomnia Insomnia is a sleep disorder that makes it difficult to fall asleep or stay asleep. Insomnia can cause fatigue, low energy, difficulty concentrating, mood swings, and poor performance at work or school. There are three different ways to classify insomnia:  Difficulty falling asleep.  Difficulty staying asleep.  Waking up too early in the morning. Any type of insomnia can be long-term (chronic) or short-term (acute). Both are common. Short-term insomnia usually lasts for three months or less. Chronic insomnia occurs at least three times a week for longer than three months. What are the causes? Insomnia may be caused by another condition, situation, or substance, such as:  Anxiety.  Certain medicines.  Gastroesophageal reflux disease (GERD) or other gastrointestinal conditions.  Asthma or other breathing conditions.  Restless legs syndrome, sleep apnea, or other sleep disorders.  Chronic pain.  Menopause.  Stroke.  Abuse of alcohol, tobacco, or illegal drugs.  Mental health conditions, such as depression.  Caffeine.  Neurological disorders, such as Alzheimer's disease.  An overactive thyroid (hyperthyroidism). Sometimes, the cause of insomnia may not be known. What increases the risk? Risk factors for insomnia include:  Gender. Women are affected  more often than men.  Age. Insomnia is more common as you get older.  Stress.  Lack of exercise.  Irregular work schedule or working night shifts.  Traveling between different time zones.  Certain medical and mental health conditions. What are the signs or symptoms? If you have insomnia, the main symptom is having trouble falling asleep or having trouble staying asleep. This may lead to other symptoms, such as:  Feeling fatigued or having low energy.  Feeling nervous about going to sleep.  Not feeling rested in the morning.  Having trouble concentrating.  Feeling irritable, anxious, or depressed. How is this diagnosed? This condition may be diagnosed based on:  Your symptoms and medical history. Your health care provider may ask about: ? Your sleep habits. ? Any medical conditions you have. ? Your mental health.  A physical exam. How is this treated? Treatment for insomnia depends on the cause. Treatment may focus on treating an underlying condition that is causing insomnia. Treatment may also include:  Medicines to help you sleep.  Counseling or therapy.  Lifestyle adjustments to help you sleep better. Follow these instructions at home: Eating and drinking  Limit or avoid alcohol, caffeinated beverages, and cigarettes, especially close to bedtime. These can disrupt your sleep.  Do not eat a large meal or eat spicy foods right before bedtime. This can lead to digestive discomfort that can make it hard for you to sleep.   Sleep habits  Keep a sleep diary to help you and your health care provider figure out what could be causing your insomnia. Write down: ? When you sleep. ? When you wake up  during the night. ? How well you sleep. ? How rested you feel the next day. ? Any side effects of medicines you are taking. ? What you eat and drink.  Make your bedroom a dark, comfortable place where it is easy to fall asleep. ? Put up shades or blackout curtains to block  light from outside. ? Use a white noise machine to block noise. ? Keep the temperature cool.  Limit screen use before bedtime. This includes: ? Watching TV. ? Using your smartphone, tablet, or computer.  Stick to a routine that includes going to bed and waking up at the same times every day and night. This can help you fall asleep faster. Consider making a quiet activity, such as reading, part of your nighttime routine.  Try to avoid taking naps during the day so that you sleep better at night.  Get out of bed if you are still awake after 15 minutes of trying to sleep. Keep the lights down, but try reading or doing a quiet activity. When you feel sleepy, go back to bed.   General instructions  Take over-the-counter and prescription medicines only as told by your health care provider.  Exercise regularly, as told by your health care provider. Avoid exercise starting several hours before bedtime.  Use relaxation techniques to manage stress. Ask your health care provider to suggest some techniques that may work well for you. These may include: ? Breathing exercises. ? Routines to release muscle tension. ? Visualizing peaceful scenes.  Make sure that you drive carefully. Avoid driving if you feel very sleepy.  Keep all follow-up visits as told by your health care provider. This is important. Contact a health care provider if:  You are tired throughout the day.  You have trouble in your daily routine due to sleepiness.  You continue to have sleep problems, or your sleep problems get worse. Get help right away if:  You have serious thoughts about hurting yourself or someone else. If you ever feel like you may hurt yourself or others, or have thoughts about taking your own life, get help right away. You can go to your nearest emergency department or call:  Your local emergency services (911 in the [place]  A suicide crisis helpline, such as the [facility] at  (270) 462-6833. This is open 24 hours a day. Summary  Insomnia is a sleep disorder that makes it difficult to fall asleep or stay asleep.  Insomnia can be long-term (chronic) or short-term (acute).  Treatment for insomnia depends on the cause. Treatment may focus on treating an underlying condition that is causing insomnia.  Keep a sleep diary to help you and your health care provider figure out what could be causing your insomnia. This information is not intended to replace advice given to you by your health care provider. Make sure you discuss any questions you have with your health care provider. Document Revised: 01/23/2020 Document Reviewed: 01/23/2020 Elsevier Patient Education  2021 [employer].

## 2020-06-24 NOTE — Progress Notes (Signed)
Subjective  Chief Complaint  Patient presents with  . Annual Exam    Fasting   . 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 weight management    Is currently at 219 lbs, down 8 lbs from last visit. Requesting to continue Saxenda injections. Current BMI is 29.7     HPI: Brett [name] is a 45 y.o. male who presents to [facility] today for a Male Wellness Visit. He also has the concerns and/or needs as listed above in the chief complaint. These will be addressed in addition to the Health Maintenance Visit.   Wellness Visit: annual visit with health maintenance review and exam    Health maintenance: Eligible for colon cancer screening but did not have time to discuss at today's visit.  Defer treatment visit.  Lives healthy lifestyle.  Exercises vigorously daily.  Immunizations are up-to-date.  Body mass index is 29.7 kg/m. Wt Readings from Last 3 Encounters:  06/24/20 219 lb (99.3 kg)  09/17/19 216 lb (98 kg)  08/19/19 226 lb 9.6 oz (102.8 kg)     Chronic disease management visit and/or acute problem visit:  ADD: Continues on Vyvanse and feels this is well controlled.  He has been feeling interrupt sleep.  Does help with food cravings.  2 refills.  Bipolar 2 disorder with history of OCD and anger issues.  We discussed his history of mood disorder.  He has been to a psychiatrist in the past.  Followed for about 10 years emergently.  Has not been to psychiatrist for the last 5 years.  He has been managed with behavioral management strategies including vigorous exercise and Wellbutrin.  Currently Wellbutrin 150 mg daily.  However, having more anxiety and irritability issues of late.  Partly exacerbated by poor sleep.  Denies mania.  Good support system with family and friends to help monitor his symptoms and behavior.  No adverse effects from Wellbutrin.  Insomnia,  entire adjustment related mood related: Worsening.  Sleeping 2 to 4 hours at night.  Has been on Ambien in the past, however he had adverse reactions to this.  Has used trazodone successfully passing well.  Prefers not to use medicines but currently feels he needs them no new triggers.  History of obesity.  Diabetes: Both resolved.  Has been on Saxenda for several years.  No longer indicated.  Weight fluctuations bother him greatly.  Admits to food cravings.  Hypogonadism: Does well on testosterone therapy feels this helps his libido, sexual function and overall wellbeing and energy levels.  Due for recheck.  Chronic back pain and osteoarthritis: Uses muscle relaxers as needed.  Idiopathic peripheral neuropathy managed with Lyrica.  Well-controlled  Patient Active Problem List   Diagnosis Date Noted  . Attention deficit hyperactivity disorder (ADHD), combined type 09/27/2017  . Bipolar 2 disorder ([place]) 06/15/2016  . Peripheral polyneuropathy 06/15/2016  . Insomnia 06/15/2016  . Unilateral primary osteoarthritis, left knee 07/10/2017  . Chronic midline low back pain without sciatica 07/10/2017  . Hypogonadism in male 05/20/2017  . Moderate persistent asthma   . Vitamin B12 deficiency 05/17/2019  . Spondylolisthesis, lumbosacral region 07/10/2017  . Left rotator cuff tear arthropathy, s/p PT and repair 06/15/2016  . Chronic seasonal allergic rhinitis due to pollen 06/15/2016  . History of diabetes mellitus 05/17/2019   Health Maintenance  Topic Date Due  . Hepatitis  C Screening  Never done  . COLONOSCOPY (Pts 45-88yrs Insurance coverage will need to be confirmed)  Never done  . COVID-19 Vaccine (1) 07/10/2020 (Originally 12/31/1980)  . INFLUENZA VACCINE  08/10/2020 (Originally 10/27/2019)  . TETANUS/TDAP  05/19/2027  . HIV Screening  Completed  . HPV VACCINES  Aged Out   Immunization History  Administered Date(s) Administered  . Influenza,inj,Quad PF,6+ Mos 12/19/2016, 02/16/2018,  01/18/2019  . Pneumococcal Polysaccharide-23 09/16/2016  . Tdap 05/18/2017   We updated and reviewed the patient's past history in detail and it is documented below. Allergies: Patient has No Known Allergies. Past Medical History  has a past medical history of Asthma, Bipolar 1 disorder ([place]) (06/15/2016), Chronic pain of both knees (06/15/2016), Chronic seasonal allergic rhinitis due to pollen (06/15/2016), Depression, Gastroesophageal reflux disease without esophagitis (06/15/2016), History of diabetes mellitus (05/17/2019), Hypertension, Insomnia (06/15/2016), Left rotator cuff tear arthropathy (06/15/2016), and Morbid obesity ([place]) (06/15/2016). Past Surgical History Patient  has a past surgical history that includes Biceps tendon repair and Panniculectomy (11/15/2018). Social History Patient  reports that he has never smoked. He has never used smokeless tobacco. He reports that he does not drink alcohol and does not use drugs. Family History family history is not on file. Review of Systems: Constitutional: negative for fever or malaise Ophthalmic: negative for photophobia, double vision or loss of vision Cardiovascular: negative for chest pain, dyspnea on exertion, or new LE swelling Respiratory: negative for SOB or persistent cough Gastrointestinal: negative for abdominal pain, change in bowel habits or melena Genitourinary: negative for dysuria or gross hematuria Musculoskeletal: negative for new gait disturbance or muscular weakness Integumentary: negative for new or persistent rashes Neurological: negative for TIA or stroke symptoms Psychiatric: negative for SI or delusions Allergic/Immunologic: negative for hives  Patient Care Team    Relationship Specialty Notifications Start End  [name], MD PCP - General Family Medicine  02/04/19   Newt Minion, MD Consulting Physician Orthopedic Surgery  09/27/17   Gerda Diss, DO Consulting Physician Family Medicine  09/27/17    Curt Jews, OD  Optometry  11/14/17    Objective  Vitals: BP 124/88   Pulse 91   Temp 97.9 F (36.6 C) (Temporal)   Resp 17   Ht 6' (1.829 m)   Wt 219 lb (99.3 kg)   SpO2 98%   BMI 29.70 kg/m  General:  Well developed, well nourished, no acute distress  Psych:  Alert and orientedx3,normal mood and affect HEENT:  Normocephalic, atraumatic, non-icteric sclera, PERRL,supple neck without adenopathy, mass or thyromegaly Cardiovascular:  Normal S1, S2, RRR without gallop, rub or murmur, nondisplaced PMI, +2 distal pulses in bilateral upper and lower extremities. Respiratory:  Good breath sounds bilaterally, CTAB with normal respiratory effort Gastrointestinal: normal bowel sounds, soft, non-tender, no noted masses. No HSM MSK: no deformities, contusions. Joints are without erythema or swelling. Spine and CVA region are nontender Skin:  Warm, no rashes or suspicious lesions noted Neurologic:    Mental status is normal. CN 2-11 are normal. Gross motor and sensory exams are normal. Stable gait. No tremor GU: No inguinal hernias or adenopathy are appreciated bilaterally   Assessment  1. Annual physical exam   2. Attention deficit hyperactivity disorder (ADHD), combined type   3. Peripheral polyneuropathy   4. Primary insomnia   5. Chronic midline low back pain without sciatica   6. Vitamin B12 deficiency   7. History of diabetes mellitus   8. Bipolar 2 disorder ([place])  58. Hypogonadism in male      Plan  Male Wellness Visit:  Age appropriate Health Maintenance and Prevention measures were discussed with patient. Included topics are cancer screening recommendations, ways to keep healthy (see AVS) including dietary and exercise recommendations, regular eye and dental care, use of seat belts, and avoidance of moderate alcohol use and tobacco use.  To discuss colon cancer screening in future date  BMI: discussed patient's BMI and encouraged positive lifestyle modifications to help get  to or maintain a target BMI.  HM needs and immunizations were addressed and ordered. See below for orders. See HM and immunization section for updates.  Routine labs and screening tests ordered including cmp, cbc and lipids where appropriate.  Discussed recommendations regarding Vit D and calcium supplementation (see AVS)  Chronic disease f/u and/or acute problem visit: (deemed necessary to be done in addition to the wellness visit):  ADD is well controlled on Vyvanse refilled.  Mood disorder: We will increase Wellbutrin to 300 mg daily.  Further mood issues persist, recommend psychiatry referral.  Complicated past medical history.  Insomnia: Start trazodone.  Education given.  History of obesity and diabetes: Both resolved.  Stop Saxenda.  Hypogonadism on testosterone placement.  Discussed risks and benefits.  Patient feels strongly this benefits him.  Recheck levels.  Maintain levels in the medium to low normal range.  This is due to his history of anger issues.  Peripheral neuropathy on Lyrica is well controlled  Follow up: 3 months for ADD follow-up and mood follow-up  Commons side effects, risks, benefits, and alternatives for medications and treatment plan prescribed today were discussed, and the patient expressed understanding of the given instructions. Patient is instructed to call or message via MyChart if he/she has any questions or concerns regarding our treatment plan. No barriers to understanding were identified. We discussed Red Flag symptoms and signs in detail. Patient expressed understanding regarding what to do in case of urgent or emergency type symptoms.   Medication list was reconciled, printed and provided to the patient in AVS. Patient instructions and summary information was reviewed with the patient as documented in the AVS. This note was prepared with assistance of Dragon voice recognition software. Occasional wrong-word or sound-a-like substitutions may have  occurred due to the inherent limitations of voice recognition software  This visit occurred during the SARS-CoV-2 public health emergency.  Safety protocols were in place, including screening questions prior to the visit, additional usage of staff PPE, and extensive cleaning of exam room while observing appropriate contact time as indicated for disinfecting solutions.   Orders Placed This Encounter  Procedures  . Testosterone,Free and Total  . CBC with Differential/Platelet  . Comprehensive metabolic panel  . Lipid panel  . TSH  . Vitamin B12  . Hepatitis C antibody   Meds ordered this encounter  Medications  . lisdexamfetamine (VYVANSE) 60 MG capsule    Sig: Take 1 capsule (60 mg total) by mouth every morning.    Dispense:  30 capsule    Refill:  0  . traZODone (DESYREL) 50 MG tablet    Sig: Take 1 tablet (50 mg total) by mouth at bedtime as needed for sleep.    Dispense:  90 tablet    Refill:  3  . lisdexamfetamine (VYVANSE) 60 MG capsule    Sig: Take 1 capsule (60 mg total) by mouth every morning.    Dispense:  30 capsule    Refill:  0  . lisdexamfetamine (VYVANSE) 60 MG  capsule    Sig: Take 1 capsule (60 mg total) by mouth every morning.    Dispense:  30 capsule    Refill:  0  . buPROPion (WELLBUTRIN XL) 300 MG 24 hr tablet    Sig: Take 1 tablet (300 mg total) by mouth daily.    Dispense:  90 tablet    Refill:  3    Increasing from 150mg  dose

## 2020-06-25 ENCOUNTER — Other Ambulatory Visit: Payer: Self-pay | Admitting: Family Medicine

## 2020-06-25 DIAGNOSIS — E291 Testicular hypofunction: Secondary | ICD-10-CM

## 2020-06-25 LAB — HEPATITIS C ANTIBODY
Hepatitis C Ab: NONREACTIVE
SIGNAL TO CUT-OFF: 0.01 (ref <1.00)

## 2020-06-25 MED ORDER — CYANOCOBALAMIN 1000 MCG PO TABS: 1000.0000 ug | ORAL_TABLET | ORAL | Status: DC

## 2020-06-25 MED ORDER — TESTOSTERONE CYPIONATE 200 MG/ML IM SOLN: 100.0000 mg | INTRAMUSCULAR | 5 refills | Status: DC

## 2020-06-25 NOTE — Progress Notes (Signed)
Called and spoke with patient about lab results, patient gave a verbal understanding.

## 2020-06-27 LAB — TESTOSTERONE,FREE AND TOTAL
Testosterone, Free: 19.5 pg/mL (ref 6.8–21.5)
Testosterone: 908 ng/dL (ref 264–916)

## 2020-07-21 ENCOUNTER — Encounter: Payer: Self-pay | Admitting: Family Medicine

## 2020-07-22 ENCOUNTER — Telehealth: Payer: Self-pay

## 2020-07-22 NOTE — Telephone Encounter (Signed)
[place] is calling in wondering if the diagnosis code for DOS 06/14/20 can be changed. Inida contacted the billing department to which they gave her our number and told her to contact us.

## 2020-07-23 NOTE — Telephone Encounter (Signed)
Please advise, also a mychart message is in patients chart

## 2020-08-10 ENCOUNTER — Other Ambulatory Visit: Payer: Self-pay | Admitting: Family Medicine

## 2020-08-10 DIAGNOSIS — E291 Testicular hypofunction: Secondary | ICD-10-CM

## 2020-08-10 NOTE — Telephone Encounter (Signed)
Last refill 07/12/2020 Last OV 06/24/2020 dx Annual physical exam

## 2020-08-21 NOTE — Telephone Encounter (Signed)
Please see what the issue is. Thank you.

## 2020-08-28 NOTE — Telephone Encounter (Signed)
Sent a message over to coding to review coding on testosterone.  Patient states it was coded as a Z00 and that Dr. Jonni Sanger had coded this differently in the past.

## 2020-09-02 NOTE — Telephone Encounter (Signed)
[facility] has updated primary code to E29.1.    This has been resubmitted by [facility].   I have informed the patient.

## 2020-09-22 ENCOUNTER — Other Ambulatory Visit: Payer: Self-pay | Admitting: Family Medicine

## 2020-09-22 DIAGNOSIS — M791 Myalgia, unspecified site: Secondary | ICD-10-CM

## 2020-09-24 ENCOUNTER — Other Ambulatory Visit: Payer: Self-pay

## 2020-09-24 ENCOUNTER — Encounter: Payer: Self-pay | Admitting: Family Medicine

## 2020-09-24 ENCOUNTER — Ambulatory Visit: Payer: BC Managed Care – PPO | Admitting: Family Medicine

## 2020-09-24 VITALS — BP 130/83 | HR 87 | Temp 98.6°F | Ht 72.0 in | Wt 213.6 lb

## 2020-09-24 DIAGNOSIS — Z1212 Encounter for screening for malignant neoplasm of rectum: Secondary | ICD-10-CM

## 2020-09-24 DIAGNOSIS — Z1211 Encounter for screening for malignant neoplasm of colon: Secondary | ICD-10-CM | POA: Diagnosis not present

## 2020-09-24 DIAGNOSIS — F319 Bipolar disorder, unspecified: Secondary | ICD-10-CM | POA: Diagnosis not present

## 2020-09-24 MED ORDER — CAPLYTA 42 MG PO CAPS: 42.0000 mg | ORAL_CAPSULE | Freq: Every day | ORAL | 11 refills | Status: DC

## 2020-09-24 MED ORDER — LISDEXAMFETAMINE DIMESYLATE 60 MG PO CAPS: 60.0000 mg | ORAL_CAPSULE | ORAL | 0 refills | Status: DC

## 2020-09-24 NOTE — Patient Instructions (Signed)
Please return in 3 months for recheck mood.   We will call you when we get the samples of [place].  I have refilled vyvanse for 3 months.   If you have any questions or concerns, please don't hesitate to send me a message via MyChart or call the office at [phone]. Thank you for visiting with [place] today! It's our pleasure caring for you.

## 2020-09-24 NOTE — Progress Notes (Signed)
Subjective  CC:  Chief Complaint  Patient presents with   Follow-up   ADHD    Sleeping a little better, mood changes doing better too    HPI: Brett [name] is a 45 y.o. male who presents to the office today to address the problems listed above in the chief complaint. 45 year old male here for mood disorder follow-up.  He continues in the discussion regarding his history of mood disorder.  He has bipolar 1, mixed depression.  He is currently on Wellbutrin as an antidepressant and takes Vyvanse for "ADD".  He uses physical exercise constant busy containing his hypomania.  He is on trazodone for sleep and feels he is sleeping better.  He has constant food cravings.  We discussed how symptoms are likely symptoms of bipolar disorder.  We discussed typical treatment for bipolar 1 disorder.  He has been on multiple antipsychotics however bipolar medications in the past.  Dislikes the weight gain associated with the antipsychotic. Health maintenance: He is now eligible for colon cancer screening.  His grandmother had late onset colon cancer.  Assessment  1. Bipolar 1 disorder ([place])   2. Screening for colorectal cancer      Plan  Bipolar 1 disorder: We will hold good discussion regarding goals diagnosis and suboptimal treatment at this point.  We discussed risk versus benefits.  Will try samples of Caplyta 42mg  daily.  Continue Wellbutrin for now.  Would like to get him off the stimulants if possible.  Depending on how he does, would also like to zyprexa at night.  Counseling and discussion for this visit that lasted 38 minutes including chart review and documentation was done. We discussed risks of untreated bipolar disorder. He will return to get samples of caplyta. Refilled vyvanse for now.  Refer to GI for colonoscopy placed today after discussion of CRC screening options.   Follow up: 3 mo for mood f/u    Orders Placed This Encounter  Procedures   Ambulatory referral to  Gastroenterology   Meds ordered this encounter  Medications   Lumateperone Tosylate (CAPLYTA) 42 MG CAPS    Sig: Take 42 mg by mouth daily.    Dispense:  30 capsule    Refill:  11   lisdexamfetamine (VYVANSE) 60 MG capsule    Sig: Take 1 capsule (60 mg total) by mouth every morning.    Dispense:  30 capsule    Refill:  0   lisdexamfetamine (VYVANSE) 60 MG capsule    Sig: Take 1 capsule (60 mg total) by mouth every morning.    Dispense:  30 capsule    Refill:  0   lisdexamfetamine (VYVANSE) 60 MG capsule    Sig: Take 1 capsule (60 mg total) by mouth every morning.    Dispense:  30 capsule    Refill:  0      I reviewed the patients updated PMH, FH, and SocHx.    Patient Active Problem List   Diagnosis Date Noted   Attention deficit hyperactivity disorder (ADHD), combined type 09/27/2017    Priority: High   Bipolar 1 disorder ([place]) 06/15/2016    Priority: High   Peripheral polyneuropathy 06/15/2016    Priority: High   Insomnia 06/15/2016    Priority: High   Unilateral primary osteoarthritis, left knee 07/10/2017    Priority: Medium   Chronic midline low back pain without sciatica 07/10/2017    Priority: Medium   Hypogonadism in male 05/20/2017    Priority: Medium   Moderate persistent  asthma     Priority: Medium   Vitamin B12 deficiency 05/17/2019    Priority: Low   Spondylolisthesis, lumbosacral region 07/10/2017    Priority: Low   Left rotator cuff tear arthropathy, s/p PT and repair 06/15/2016    Priority: Low   Chronic seasonal allergic rhinitis due to pollen 06/15/2016    Priority: Low   History of diabetes mellitus 05/17/2019   Current Meds  Medication Sig   albuterol (VENTOLIN HFA) 108 (90 Base) MCG/ACT inhaler INHALE 2 PUFFS BY MOUTH EVERY 6 HOURS AS NEEDED FOR WHEEZE OR SHORTNESS OF BREATH   buPROPion (WELLBUTRIN XL) 300 MG 24 hr tablet Take 1 tablet (300 mg total) by mouth daily.   celecoxib (CELEBREX) 100 MG capsule TAKE 1 CAPSULE BY MOUTH TWICE A DAY  AS NEEDED   Insulin Pen Needle 32G X 4 MM MISC Use 1 needle per day as directed   Lumateperone Tosylate (CAPLYTA) 42 MG CAPS Take 42 mg by mouth daily.   MAGNESIUM CITRATE PO Take 1 tablet by mouth every evening.   methocarbamol (ROBAXIN) 750 MG tablet TAKE 1 TABLET (750 MG TOTAL) BY MOUTH 2 (TWO) TIMES DAILY AS NEEDED FOR MUSCLE SPASMS.   pregabalin (LYRICA) 200 MG capsule Take 1 capsule (200 mg total) by mouth 2 (two) times daily.   testosterone cypionate (DEPOTESTOSTERONE CYPIONATE) 200 MG/ML injection INJECT 1 ML (200 MG TOTAL) INTO THE MUSCLE EVERY 14 (FOURTEEN) DAYS.   traZODone (DESYREL) 50 MG tablet Take 1 tablet (50 mg total) by mouth at bedtime as needed for sleep.   [DISCONTINUED] lisdexamfetamine (VYVANSE) 60 MG capsule Take 1 capsule (60 mg total) by mouth every morning.   [DISCONTINUED] lisdexamfetamine (VYVANSE) 60 MG capsule Take 1 capsule (60 mg total) by mouth every morning.   [DISCONTINUED] lisdexamfetamine (VYVANSE) 60 MG capsule Take 1 capsule (60 mg total) by mouth every morning.    Allergies: Patient has No Known Allergies. Family History: Patient family history is not on file. Social History:  Patient  reports that he has never smoked. He has never used smokeless tobacco. He reports that he does not drink alcohol and does not use drugs.  Review of Systems: Constitutional: Negative for fever malaise or anorexia Cardiovascular: negative for chest pain Respiratory: negative for SOB or persistent cough Gastrointestinal: negative for abdominal pain  Objective  Vitals: BP 130/83   Pulse 87   Temp 98.6 F (37 C) (Temporal)   Ht 6' (1.829 m)   Wt 213 lb 9.6 oz (96.9 kg)   SpO2 98%   BMI 28.97 kg/m  General: no acute distress , A&Ox3   Commons side effects, risks, benefits, and alternatives for medications and treatment plan prescribed today were discussed, and the patient expressed understanding of the given instructions. Patient is instructed to call or message  via MyChart if he/she has any questions or concerns regarding our treatment plan. No barriers to understanding were identified. We discussed Red Flag symptoms and signs in detail. Patient expressed understanding regarding what to do in case of urgent or emergency type symptoms.  Medication list was reconciled, printed and provided to the patient in AVS. Patient instructions and summary information was reviewed with the patient as documented in the AVS. This note was prepared with assistance of Dragon voice recognition software. Occasional wrong-word or sound-a-like substitutions may have occurred due to the inherent limitations of voice recognition software  This visit occurred during the SARS-CoV-2 public health emergency.  Safety protocols were in place, including screening questions prior  to the visit, additional usage of staff PPE, and extensive cleaning of exam room while observing appropriate contact time as indicated for disinfecting solutions.

## 2020-09-25 ENCOUNTER — Telehealth: Payer: Self-pay | Admitting: Family Medicine

## 2020-09-25 NOTE — Telephone Encounter (Signed)
Noted  

## 2020-09-25 NOTE — Telephone Encounter (Signed)
Please call patient: I have samples of Caplyta 42mg  po daily for him to pick up.   Please ask him to take his RX to the pharmacy after trying the samples for a week or so if he tolerated them.   May need prior auth.   Thanks.  Samples are on my desk.

## 2020-09-25 NOTE — Telephone Encounter (Signed)
Brett [name], can you please call pt and take care of this. Thanks

## 2020-11-05 ENCOUNTER — Other Ambulatory Visit: Payer: Self-pay | Admitting: Family Medicine

## 2020-11-05 DIAGNOSIS — M2550 Pain in unspecified joint: Secondary | ICD-10-CM

## 2020-12-03 NOTE — Progress Notes (Signed)
[place] Phone: [phone] Subjective:   Brett [name], am serving as a scribe for Dr. Hulan Saas. This visit occurred during the SARS-CoV-2 public health emergency.  Safety protocols were in place, including screening questions prior to the visit, additional usage of staff PPE, and extensive cleaning of exam room while observing appropriate contact time as indicated for disinfecting solutions.   I'm seeing this patient by the request  of:  [name], MD  CC: Low back pain  [id number]  Brett [name] is a 45 y.o. male coming in with complaint of polyarthralgia. Last seen in 2021 for back pain. Last seen in 2021 for knee pain. Patient states that he is having pain in multiple joints: shoulders, hips, knees. Patient has trouble walking once he first gets up but this goes away as the day progresses. Patient is active despite pain in multiple joints. Tore R bicep in 2021 and had surgery. History of tearing L bicep as well years ago.      Lumbar xray 2019 2 view radiographs of the lumbar spine shows a grade 1 spondylolisthesis  L5-S1 with a very large transverse processes at L5 that abut the  sacroiliac joint with sclerotic changes.  Past Medical History:  Diagnosis Date   Asthma    Bipolar 1 disorder ([place]) 06/15/2016   Previously followed by Psychiatry. Hx of "anger issues." Hx of medication use has included Seroquel and Ativan, both of which have helped him in the past.    Chronic pain of both knees 06/15/2016   Chronic seasonal allergic rhinitis due to pollen 06/15/2016   Depression    Gastroesophageal reflux disease without esophagitis 06/15/2016   History of diabetes mellitus 05/17/2019   Resolved after > 100 pound weight loss and lifestyle changes. 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    Hypertension    Insomnia 06/15/2016   Left rotator cuff tear arthropathy 06/15/2016    s/p repair and PT. [name] ongoing issues.   Morbid obesity ([place]) 06/15/2016   Past Surgical History:  Procedure Laterality Date   BICEPS TENDON REPAIR     PANNICULECTOMY  11/15/2018   loose skin removal surgery   Social History   Socioeconomic History   Marital status: Married    Spouse name: Not on file   Number of children: 2   Years of education: Not on file   Highest education level: Not on file  Occupational History   Occupation: Camera operator: [employer]  Tobacco Use   Smoking status: Never   Smokeless tobacco: Never  Substance and Sexual Activity   Alcohol use: [name]   Drug use: [name]   Sexual activity: Yes  Other Topics Concern   Not on file  Social History Narrative   Lives with wife and two children, all patients of [facility]. Wants to get back to weight lifting. Lives near [place] in [place].   Social Determinants of Health   Financial Resource Strain: Not on file  Food Insecurity: Not on file  Transportation Needs: Not on file  Physical Activity: Not on file  Stress: Not on file  Social Connections: Not on file   [name] Known Allergies [name] family history on file.  Current Outpatient Medications (Endocrine & Metabolic):    testosterone cypionate (DEPOTESTOSTERONE CYPIONATE) 200 MG/ML injection, INJECT 1 ML (200 MG TOTAL) INTO THE MUSCLE EVERY 14 (FOURTEEN) DAYS.   Current Outpatient  Medications (Respiratory):    albuterol (VENTOLIN HFA) 108 (90 Base) MCG/ACT inhaler, INHALE 2 PUFFS BY MOUTH EVERY 6 HOURS AS NEEDED FOR WHEEZE OR SHORTNESS OF BREATH  Current Outpatient Medications (Analgesics):    celecoxib (CELEBREX) 100 MG capsule, TAKE 1 CAPSULE BY MOUTH TWICE A DAY AS NEEDED   Current Outpatient Medications (Other):    buPROPion (WELLBUTRIN XL) 300 MG 24 hr tablet, Take 1 tablet (300 mg total) by mouth daily.   Insulin Pen Needle 32G X 4 MM MISC, Use 1 needle per day as directed   lisdexamfetamine (VYVANSE) 60 MG capsule, Take 1  capsule (60 mg total) by mouth every morning.   lisdexamfetamine (VYVANSE) 60 MG capsule, Take 1 capsule (60 mg total) by mouth every morning.   lisdexamfetamine (VYVANSE) 60 MG capsule, Take 1 capsule (60 mg total) by mouth every morning.   Lumateperone Tosylate (CAPLYTA) 42 MG CAPS, Take 42 mg by mouth daily.   MAGNESIUM CITRATE PO*, Take 1 tablet by mouth every evening.   methocarbamol (ROBAXIN) 750 MG tablet, TAKE 1 TABLET (750 MG TOTAL) BY MOUTH 2 (TWO) TIMES DAILY AS NEEDED FOR MUSCLE SPASMS.   pregabalin (LYRICA) 200 MG capsule, Take 1 capsule (200 mg total) by mouth 2 (two) times daily.   traZODone (DESYREL) 50 MG tablet, Take 1 tablet (50 mg total) by mouth at bedtime as needed for sleep. * These medications belong to multiple therapeutic classes and are listed under each applicable group.   Reviewed prior external information including notes and imaging from  primary care provider As well as notes that were available from care everywhere and other healthcare systems.  Past medical history, social, surgical and family history all reviewed in electronic medical record.  [name] pertanent information unless stated regarding to the chief complaint.   Review of Systems:  [name] headache, visual changes, nausea, vomiting, diarrhea, constipation, dizziness, abdominal pain, skin rash, fevers, chills, night sweats, weight loss, swollen lymph nodes, body aches, joint swelling, chest pain, shortness of breath, mood changes. POSITIVE muscle aches  Objective  Blood pressure (!) 122/98, pulse 82, height 6' (1.829 m), weight 207 lb (93.9 kg), SpO2 98 %.   General: [name] apparent distress alert and oriented x3 mood and affect normal, dressed appropriately.  HEENT: Pupils equal, extraocular movements intact  Respiratory: Patient's speak in full sentences and does not appear short of breath  Cardiovascular: [name] lower extremity edema, non tender, [name] erythema  Gait normal with good balance and coordination.   MSK: Low back exam does have some tenderness to palpation.  Patient does have significant tightness at the sacroiliac joints right greater than left.  Does have tightness in the neck as well.  Some limited sidebending.  Tightness in the parascapular region as well right greater than left.  Osteopathic findings  C6 flexed rotated and side bent right T6 extended rotated and side bent right inhaled third rib L4 flexed rotated and side bent l;eft  Sacrum right on right     Impression and Recommendations:     .znd

## 2020-12-04 ENCOUNTER — Ambulatory Visit: Payer: BC Managed Care – PPO | Admitting: Family Medicine

## 2020-12-04 ENCOUNTER — Encounter: Payer: Self-pay | Admitting: Family Medicine

## 2020-12-04 ENCOUNTER — Telehealth: Payer: Self-pay | Admitting: Family Medicine

## 2020-12-04 ENCOUNTER — Other Ambulatory Visit: Payer: Self-pay

## 2020-12-04 VITALS — BP 122/98 | HR 82 | Ht 72.0 in | Wt 207.0 lb

## 2020-12-04 DIAGNOSIS — M9904 Segmental and somatic dysfunction of sacral region: Secondary | ICD-10-CM

## 2020-12-04 DIAGNOSIS — M2550 Pain in unspecified joint: Secondary | ICD-10-CM | POA: Diagnosis not present

## 2020-12-04 DIAGNOSIS — M9908 Segmental and somatic dysfunction of rib cage: Secondary | ICD-10-CM | POA: Diagnosis not present

## 2020-12-04 DIAGNOSIS — M9903 Segmental and somatic dysfunction of lumbar region: Secondary | ICD-10-CM

## 2020-12-04 DIAGNOSIS — M4317 Spondylolisthesis, lumbosacral region: Secondary | ICD-10-CM | POA: Diagnosis not present

## 2020-12-04 DIAGNOSIS — M9902 Segmental and somatic dysfunction of thoracic region: Secondary | ICD-10-CM

## 2020-12-04 DIAGNOSIS — M9901 Segmental and somatic dysfunction of cervical region: Secondary | ICD-10-CM | POA: Diagnosis not present

## 2020-12-04 LAB — FERRITIN: Ferritin: 77.3 ng/mL (ref 22.0–322.0)

## 2020-12-04 LAB — CBC WITH DIFFERENTIAL/PLATELET
Basophils Absolute: 0 10*3/uL (ref 0.0–0.1)
Basophils Relative: 0.8 % (ref 0.0–3.0)
Eosinophils Absolute: 0.1 10*3/uL (ref 0.0–0.7)
Eosinophils Relative: 1.4 % (ref 0.0–5.0)
HCT: 49.8 % (ref 39.0–52.0)
Hemoglobin: 16.6 g/dL (ref 13.0–17.0)
Lymphocytes Relative: 30 % (ref 12.0–46.0)
Lymphs Abs: 1.4 10*3/uL (ref 0.7–4.0)
MCHC: 33.3 g/dL (ref 30.0–36.0)
MCV: 90 fl (ref 78.0–100.0)
Monocytes Absolute: 0.4 10*3/uL (ref 0.1–1.0)
Monocytes Relative: 8.6 % (ref 3.0–12.0)
Neutro Abs: 2.8 10*3/uL (ref 1.4–7.7)
Neutrophils Relative %: 59.2 % (ref 43.0–77.0)
Platelets: 136 10*3/uL — ABNORMAL LOW (ref 150.0–400.0)
RBC: 5.54 Mil/uL (ref 4.22–5.81)
RDW: 13.9 % (ref 11.5–15.5)
WBC: 4.8 10*3/uL (ref 4.0–10.5)

## 2020-12-04 LAB — COMPREHENSIVE METABOLIC PANEL
ALT: 17 U/L (ref 0–53)
AST: 14 U/L (ref 0–37)
Albumin: 4.8 g/dL (ref 3.5–5.2)
Alkaline Phosphatase: 42 U/L (ref 39–117)
BUN: 15 mg/dL (ref 6–23)
CO2: 35 mEq/L — ABNORMAL HIGH (ref 19–32)
Calcium: 9.8 mg/dL (ref 8.4–10.5)
Chloride: 99 mEq/L (ref 96–112)
Creatinine, Ser: 1.01 mg/dL (ref 0.40–1.50)
GFR: 89.55 mL/min (ref >60.00)
Glucose, Bld: 97 mg/dL (ref 70–99)
Potassium: 4.6 mEq/L (ref 3.5–5.1)
Sodium: 140 mEq/L (ref 135–145)
Total Bilirubin: 0.9 mg/dL (ref 0.2–1.2)
Total Protein: 7.1 g/dL (ref 6.0–8.3)

## 2020-12-04 LAB — VITAMIN D 25 HYDROXY (VIT D DEFICIENCY, FRACTURES): VITD: 113.41 ng/mL (ref 30.00–100.00)

## 2020-12-04 LAB — IBC PANEL
Iron: 113 ug/dL (ref 42–165)
Saturation Ratios: 30.3 % (ref 20.0–50.0)
TIBC: 372.4 ug/dL (ref 250.0–450.0)
Transferrin: 266 mg/dL (ref 212.0–360.0)

## 2020-12-04 LAB — SEDIMENTATION RATE: Sed Rate: 4 mm/hr (ref 0–15)

## 2020-12-04 LAB — PSA: PSA: 0.77 ng/mL (ref 0.10–4.00)

## 2020-12-04 LAB — URIC ACID: Uric Acid, Serum: 4.9 mg/dL (ref 4.0–7.8)

## 2020-12-04 NOTE — Assessment & Plan Note (Signed)
Patient has had a history of spondylolisthesis previously.  Does have some tightness in the lower back.  Patient has lost significant weight and is continuing to stay active.  Does have different medications but is still taking Lyrica as well as methocarbamol.  Patient's pain is somewhat out of proportion to the amount of palpation today as well.  Attempted osteopathic manipulation and we will get laboratory work-up.  Patient is on elevated testosterone and has had polyneuropathy as well as increase in back pain so we will check some other labs to further evaluate.  Follow-up with me again 6 to 8 weeks

## 2020-12-04 NOTE — Patient Instructions (Addendum)
Scapular ex Labs today Tried manipulation  See me in 5 weeks

## 2020-12-04 NOTE — Telephone Encounter (Signed)
Hope from [facility] called with critical value for Vit D 113.41

## 2020-12-04 NOTE — Assessment & Plan Note (Signed)

## 2020-12-07 LAB — RHEUMATOID FACTOR: Rheumatoid fact SerPl-aCnc: <14 IU/mL (ref <14)

## 2020-12-07 LAB — ANA: Anti Nuclear Antibody (ANA): NEGATIVE

## 2020-12-07 LAB — PTH, INTACT AND CALCIUM
Calcium: 9.8 mg/dL (ref 8.6–10.3)
PTH: 14 pg/mL — ABNORMAL LOW (ref 16–77)

## 2020-12-07 LAB — CYCLIC CITRUL PEPTIDE ANTIBODY, IGG: Cyclic Citrullin Peptide Ab: <16 UNITS

## 2020-12-21 ENCOUNTER — Other Ambulatory Visit: Payer: Self-pay | Admitting: Family Medicine

## 2020-12-21 ENCOUNTER — Encounter: Payer: Self-pay | Admitting: Family Medicine

## 2020-12-21 DIAGNOSIS — G629 Polyneuropathy, unspecified: Secondary | ICD-10-CM

## 2020-12-21 NOTE — Telephone Encounter (Signed)
Pt requesting Lyrica/Pregabalin 200mg  cap LOV:09/24/2020 Next Visit: 12/30/2020 Last script written: 06/02/2020  Please Advise.

## 2020-12-28 ENCOUNTER — Other Ambulatory Visit: Payer: Self-pay | Admitting: Family Medicine

## 2020-12-28 DIAGNOSIS — J4530 Mild persistent asthma, uncomplicated: Secondary | ICD-10-CM

## 2020-12-30 ENCOUNTER — Encounter: Payer: Self-pay | Admitting: Family Medicine

## 2020-12-30 ENCOUNTER — Ambulatory Visit: Payer: BC Managed Care – PPO | Admitting: Family Medicine

## 2020-12-30 ENCOUNTER — Other Ambulatory Visit: Payer: Self-pay

## 2020-12-30 VITALS — BP 120/78 | HR 75 | Temp 97.6°F | Ht 72.0 in | Wt 204.6 lb

## 2020-12-30 DIAGNOSIS — E538 Deficiency of other specified B group vitamins: Secondary | ICD-10-CM

## 2020-12-30 DIAGNOSIS — F902 Attention-deficit hyperactivity disorder, combined type: Secondary | ICD-10-CM

## 2020-12-30 DIAGNOSIS — F319 Bipolar disorder, unspecified: Secondary | ICD-10-CM

## 2020-12-30 DIAGNOSIS — E291 Testicular hypofunction: Secondary | ICD-10-CM

## 2020-12-30 DIAGNOSIS — F5101 Primary insomnia: Secondary | ICD-10-CM

## 2020-12-30 DIAGNOSIS — G8929 Other chronic pain: Secondary | ICD-10-CM

## 2020-12-30 DIAGNOSIS — M545 Low back pain, unspecified: Secondary | ICD-10-CM | POA: Diagnosis not present

## 2020-12-30 LAB — VITAMIN B12: Vitamin B-12: 730 pg/mL (ref 211–911)

## 2020-12-30 MED ORDER — MELOXICAM 15 MG PO TABS: 15.0000 mg | ORAL_TABLET | Freq: Every day | ORAL | 0 refills | Status: DC

## 2020-12-30 MED ORDER — LISDEXAMFETAMINE DIMESYLATE 60 MG PO CAPS: 60.0000 mg | ORAL_CAPSULE | ORAL | 0 refills | Status: DC

## 2020-12-30 NOTE — Patient Instructions (Signed)
Please return in 3 months for recheck.   I will see if a prior authorization will get the caplyta covered.   Try the meloxicam in place of the celebrex. See which works better.   I have sent in Vyvanse for the next 3 months.   If you have any questions or concerns, please don't hesitate to send me a message via MyChart or call the office at [phone]. Thank you for visiting with [place] today! It's our pleasure caring for you.

## 2020-12-30 NOTE — Progress Notes (Signed)
Subjective  CC:  Chief Complaint  Patient presents with   ADHD    HPI: Brett [name] is a 45 y.o. male who presents to the office today to address the problems listed above in the chief complaint.  Patient is here today for follow up of ADD/ADHD and bipolar d/o. Doing well overall. Conintues with vyvanse, wellbutrin and trazadone for sleep. Never started caplyta due to insurance issues. He is hesitant because struggles with inconvenience of dealing with coverage barriers. But still open to trying it if he could.   Back and joint pain: reviewed SM notes. Neg inflammatory panel. Celebrex and mm relaxers along with lyrica for neuropathy. Now with OMT as well. Has orthopedist  Low T and b12 due for recheck.    Assessment  1. Bipolar 1 disorder ([place])   2. Attention deficit hyperactivity disorder (ADHD), combined type   3. Primary insomnia   4. Chronic midline low back pain without sciatica   5. Hypogonadism in male   44. Vitamin B12 deficiency      Plan  ADD/bipolar:  reiterated preference of trying caplyta. Will see if PA is needed. Refilled Vyvanse. Continue wellbutrin and trazadone for now. Would consider zyprexa with caplyta in future.  Pain, musculoskeletal and multifactorial: he exercises excessively for weight control and mood control. Concerned about problems with pain mgt in future. Celebrex is helpful. He will try mobic to see which he prefers. Discussed risks/beneftis of nsaid use. Continue lyrica and mm relaxers Recheck T and b12. Adjust doses if needed.    Follow up: Return in about 3 months (around 04/01/2021) for mood follow up, follow up on ADD.  Orders Placed This Encounter  Procedures   Testosterone,Free and Total   Vitamin B12   Meds ordered this encounter  Medications   lisdexamfetamine (VYVANSE) 60 MG capsule    Sig: Take 1 capsule (60 mg total) by mouth every morning.    Dispense:  30 capsule    Refill:  0   lisdexamfetamine (VYVANSE) 60 MG capsule     Sig: Take 1 capsule (60 mg total) by mouth every morning.    Dispense:  30 capsule    Refill:  0   lisdexamfetamine (VYVANSE) 60 MG capsule    Sig: Take 1 capsule (60 mg total) by mouth every morning.    Dispense:  30 capsule    Refill:  0   meloxicam (MOBIC) 15 MG tablet    Sig: Take 1 tablet (15 mg total) by mouth daily.    Dispense:  30 tablet    Refill:  0      I reviewed the patients updated PMH, FH, and SocHx.    Patient Active Problem List   Diagnosis Date Noted   Attention deficit hyperactivity disorder (ADHD), combined type 09/27/2017    Priority: 1.   Bipolar 1 disorder ([place]) 06/15/2016    Priority: 1.   Peripheral polyneuropathy 06/15/2016    Priority: 1.   Insomnia 06/15/2016    Priority: 1.   Unilateral primary osteoarthritis, left knee 07/10/2017    Priority: 2.   Chronic midline low back pain without sciatica 07/10/2017    Priority: 2.   Hypogonadism in male 05/20/2017    Priority: 2.   Moderate persistent asthma     Priority: 2.   Vitamin B12 deficiency 05/17/2019    Priority: 3.   Spondylolisthesis, lumbosacral region 07/10/2017    Priority: 3.   Left rotator cuff tear arthropathy, s/p PT and repair  06/15/2016    Priority: 3.   Chronic seasonal allergic rhinitis due to pollen 06/15/2016    Priority: 3.   Somatic dysfunction of spine, lumbar 12/04/2020   History of diabetes mellitus 05/17/2019   Current Meds  Medication Sig   albuterol (VENTOLIN HFA) 108 (90 Base) MCG/ACT inhaler INHALE 2 PUFFS BY MOUTH EVERY 6 HOURS AS NEEDED FOR WHEEZE OR SHORTNESS OF BREATH   buPROPion (WELLBUTRIN XL) 300 MG 24 hr tablet Take 1 tablet (300 mg total) by mouth daily.   celecoxib (CELEBREX) 100 MG capsule TAKE 1 CAPSULE BY MOUTH TWICE A DAY AS NEEDED   MAGNESIUM CITRATE PO Take 1 tablet by mouth every evening.   meloxicam (MOBIC) 15 MG tablet Take 1 tablet (15 mg total) by mouth daily.   methocarbamol (ROBAXIN) 750 MG tablet TAKE 1 TABLET (750 MG TOTAL) BY MOUTH  2 (TWO) TIMES DAILY AS NEEDED FOR MUSCLE SPASMS.   pregabalin (LYRICA) 200 MG capsule TAKE 1 CAPSULE BY MOUTH TWICE A DAY   testosterone cypionate (DEPOTESTOSTERONE CYPIONATE) 200 MG/ML injection INJECT 1 ML (200 MG TOTAL) INTO THE MUSCLE EVERY 14 (FOURTEEN) DAYS.   traZODone (DESYREL) 50 MG tablet Take 1 tablet (50 mg total) by mouth at bedtime as needed for sleep.   [DISCONTINUED] lisdexamfetamine (VYVANSE) 60 MG capsule Take 1 capsule (60 mg total) by mouth every morning.   [DISCONTINUED] lisdexamfetamine (VYVANSE) 60 MG capsule Take 1 capsule (60 mg total) by mouth every morning.   [DISCONTINUED] lisdexamfetamine (VYVANSE) 60 MG capsule Take 1 capsule (60 mg total) by mouth every morning.    Allergies: Patient has No Known Allergies. Family History: Patient family history is not on file. Social History:  Patient  reports that he has never smoked. He has never used smokeless tobacco. He reports that he does not drink alcohol and does not use drugs.  Review of Systems: Constitutional: Negative for fever malaise or anorexia Cardiovascular: negative for chest pain Respiratory: negative for SOB or persistent cough Gastrointestinal: negative for abdominal pain  Objective  Vitals: BP 120/78   Pulse 75   Temp 97.6 F (36.4 C) (Temporal)   Ht 6' (1.829 m)   Wt 204 lb 9.6 oz (92.8 kg)   SpO2 98%   BMI 27.75 kg/m  General: no acute distress , A&Ox3   Commons side effects, risks, benefits, and alternatives for medications and treatment plan prescribed today were discussed, and the patient expressed understanding of the given instructions. Patient is instructed to call or message via MyChart if he/she has any questions or concerns regarding our treatment plan. No barriers to understanding were identified. We discussed Red Flag symptoms and signs in detail. Patient expressed understanding regarding what to do in case of urgent or emergency type symptoms.  Medication list was reconciled,  printed and provided to the patient in AVS. Patient instructions and summary information was reviewed with the patient as documented in the AVS. This note was prepared with assistance of Dragon voice recognition software. Occasional wrong-word or sound-a-like substitutions may have occurred due to the inherent limitations of voice recognition software

## 2021-01-01 ENCOUNTER — Telehealth: Payer: Self-pay

## 2021-01-01 DIAGNOSIS — F319 Bipolar disorder, unspecified: Secondary | ICD-10-CM

## 2021-01-01 MED ORDER — CAPLYTA 42 MG PO CAPS
42.0000 mg | ORAL_CAPSULE | Freq: Every day | ORAL | 11 refills | Status: DC
Start: 2021-01-01 — End: 2021-04-06

## 2021-01-01 MED ORDER — CAPLYTA 42 MG PO CAPS: 42.0000 mg | ORAL_CAPSULE | Freq: Every day | ORAL | 11 refills | Status: DC

## 2021-01-01 NOTE — Telephone Encounter (Signed)
Spoke with patient about PA for Caplyta being approved. Gave a verbal understanding, will send medication to the pharmacy

## 2021-01-03 LAB — TESTOSTERONE,FREE AND TOTAL
Testosterone, Free: 16.2 pg/mL (ref 6.8–21.5)
Testosterone: 1063 ng/dL — ABNORMAL HIGH (ref 264–916)

## 2021-01-04 ENCOUNTER — Other Ambulatory Visit: Payer: Self-pay

## 2021-01-04 DIAGNOSIS — E291 Testicular hypofunction: Secondary | ICD-10-CM

## 2021-01-04 MED ORDER — TESTOSTERONE CYPIONATE 200 MG/ML IM SOLN
INTRAMUSCULAR | 5 refills | Status: DC
Start: 2021-01-04 — End: 2021-01-04

## 2021-01-04 MED ORDER — TESTOSTERONE CYPIONATE 200 MG/ML IM SOLN: INTRAMUSCULAR | 1 refills | Status: DC

## 2021-01-04 NOTE — Progress Notes (Signed)
Last OV 12/30/2020 dx bipolar 1 disorder  Patient requesting refill so he does not run out

## 2021-01-06 NOTE — Progress Notes (Signed)
Brett [name] [address] [place] Phone: [phone] Subjective:   Brett [name], am serving as a scribe for Dr. Hulan Saas. This visit occurred during the SARS-CoV-2 public health emergency.  Safety protocols were in place, including screening questions prior to the visit, additional usage of staff PPE, and extensive cleaning of exam room while observing appropriate contact time as indicated for disinfecting solutions.   I'm seeing this patient by the request  of:  [name], MD  CC: Back and neck pain  [id number]  Brett [name] is a 45 y.o. male coming in with complaint of back and neck pain. OMT 12/04/2020. Patient states hips feel week, he has problems making a fist. He's been doing more jiujutsu and throws and can feel that something is out of place.  Medications patient has been prescribed: None  Taking:  Reviewed patient's imaging of the x-ray from 2019.  X-rays show the patient did have a grade 1 spondylolisthesis at L5-S1 with a very large transverse process at the L5        Past Medical History:  Diagnosis Date   Asthma    Bipolar 1 disorder ([place]) 06/15/2016   Previously followed by Psychiatry. Hx of "anger issues." Hx of medication use has included Seroquel and Ativan, both of which have helped him in the past.    Chronic pain of both knees 06/15/2016   Chronic seasonal allergic rhinitis due to pollen 06/15/2016   Depression    Gastroesophageal reflux disease without esophagitis 06/15/2016   History of diabetes mellitus 05/17/2019   Resolved after > 100 pound weight loss and lifestyle changes. 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 No ongoing issues.   Morbid obesity ([place]) 06/15/2016      Review of Systems:  No headache, visual changes, nausea, vomiting, diarrhea, constipation,  dizziness, abdominal pain, skin rash, fevers, chills, night sweats, weight loss, swollen lymph nodes, body aches, joint swelling, chest pain, shortness of breath, mood changes. POSITIVE muscle aches  Objective  Blood pressure 132/86, pulse 91, height 6' (1.829 m), weight 206 lb (93.4 kg), SpO2 99 %.   General: No apparent distress alert and oriented x3 mood and affect normal, dressed appropriately.  HEENT: Pupils equal, extraocular movements intact  Respiratory: Patient's speak in full sentences and does not appear short of breath  Cardiovascular: No lower extremity edema, non tender, no erythema  Low back exam shows some loss of lordosis.  Patient does have increasing atrophy of the lower extremities that appears.  Seems to be more of the thigh as well as the calf.  Patient also has increasing weakness with 4 out of 5 strength with hip flexion as well as dorsiflexion of the feet.  Osteopathic findings  C2 flexed rotated and side bent right C7 flexed rotated and side bent left T3 extended rotated and side bent right inhaled rib T5 extended rotated and side bent left        Assessment and Plan:  Spondylolisthesis, lumbosacral region History of spondylolisthesis.  Patient is having worsening back pain and some lower leg pain.  Has noticed some increasing in weakness.  We have attempted osteopathic manipulation but patient feels like he is worsening over the course of time now.  Discussed with patient icing regimen and home exercises.  Patient is to increase activity slowly.  Patient has had abnormal x-rays previously.   Nonallopathic problems  Decision today to treat with OMT was based on Physical Exam  After verbal consent patient was treated with  ME, FPR techniques in cervical, rib, thoracic,   areas avoided lower back secondary to symptoms.  Patient tolerated the procedure well with improvement in symptoms  Patient given exercises, stretches and lifestyle modifications  See  medications in patient instructions if given  Patient will follow up in 4-8 weeks      The above documentation has been reviewed and is accurate and complete [name], DO        Note: This dictation was prepared with Dragon dictation along with smaller phrase technology. Any transcriptional errors that result from this process are unintentional.

## 2021-01-07 ENCOUNTER — Ambulatory Visit (INDEPENDENT_AMBULATORY_CARE_PROVIDER_SITE_OTHER): Payer: BC Managed Care – PPO

## 2021-01-07 ENCOUNTER — Other Ambulatory Visit: Payer: Self-pay

## 2021-01-07 ENCOUNTER — Ambulatory Visit: Payer: BC Managed Care – PPO | Admitting: Family Medicine

## 2021-01-07 VITALS — BP 132/86 | HR 91 | Ht 72.0 in | Wt 206.0 lb

## 2021-01-07 DIAGNOSIS — G8929 Other chronic pain: Secondary | ICD-10-CM

## 2021-01-07 DIAGNOSIS — M9902 Segmental and somatic dysfunction of thoracic region: Secondary | ICD-10-CM | POA: Diagnosis not present

## 2021-01-07 DIAGNOSIS — M4317 Spondylolisthesis, lumbosacral region: Secondary | ICD-10-CM | POA: Diagnosis not present

## 2021-01-07 DIAGNOSIS — M9908 Segmental and somatic dysfunction of rib cage: Secondary | ICD-10-CM

## 2021-01-07 DIAGNOSIS — M545 Low back pain, unspecified: Secondary | ICD-10-CM | POA: Diagnosis not present

## 2021-01-07 DIAGNOSIS — M9901 Segmental and somatic dysfunction of cervical region: Secondary | ICD-10-CM | POA: Diagnosis not present

## 2021-01-07 IMAGING — DX DG PELVIS 1-2V
2 series · 2 of 2 positions shown · non-contrast
Comparison: None.

CLINICAL DATA: Back pain. Chronic midline low back and pelvic pain.
No sciatica.

EXAM:
PELVIS - 1-2 VIEW

[pelvis ap (1 of 2)]
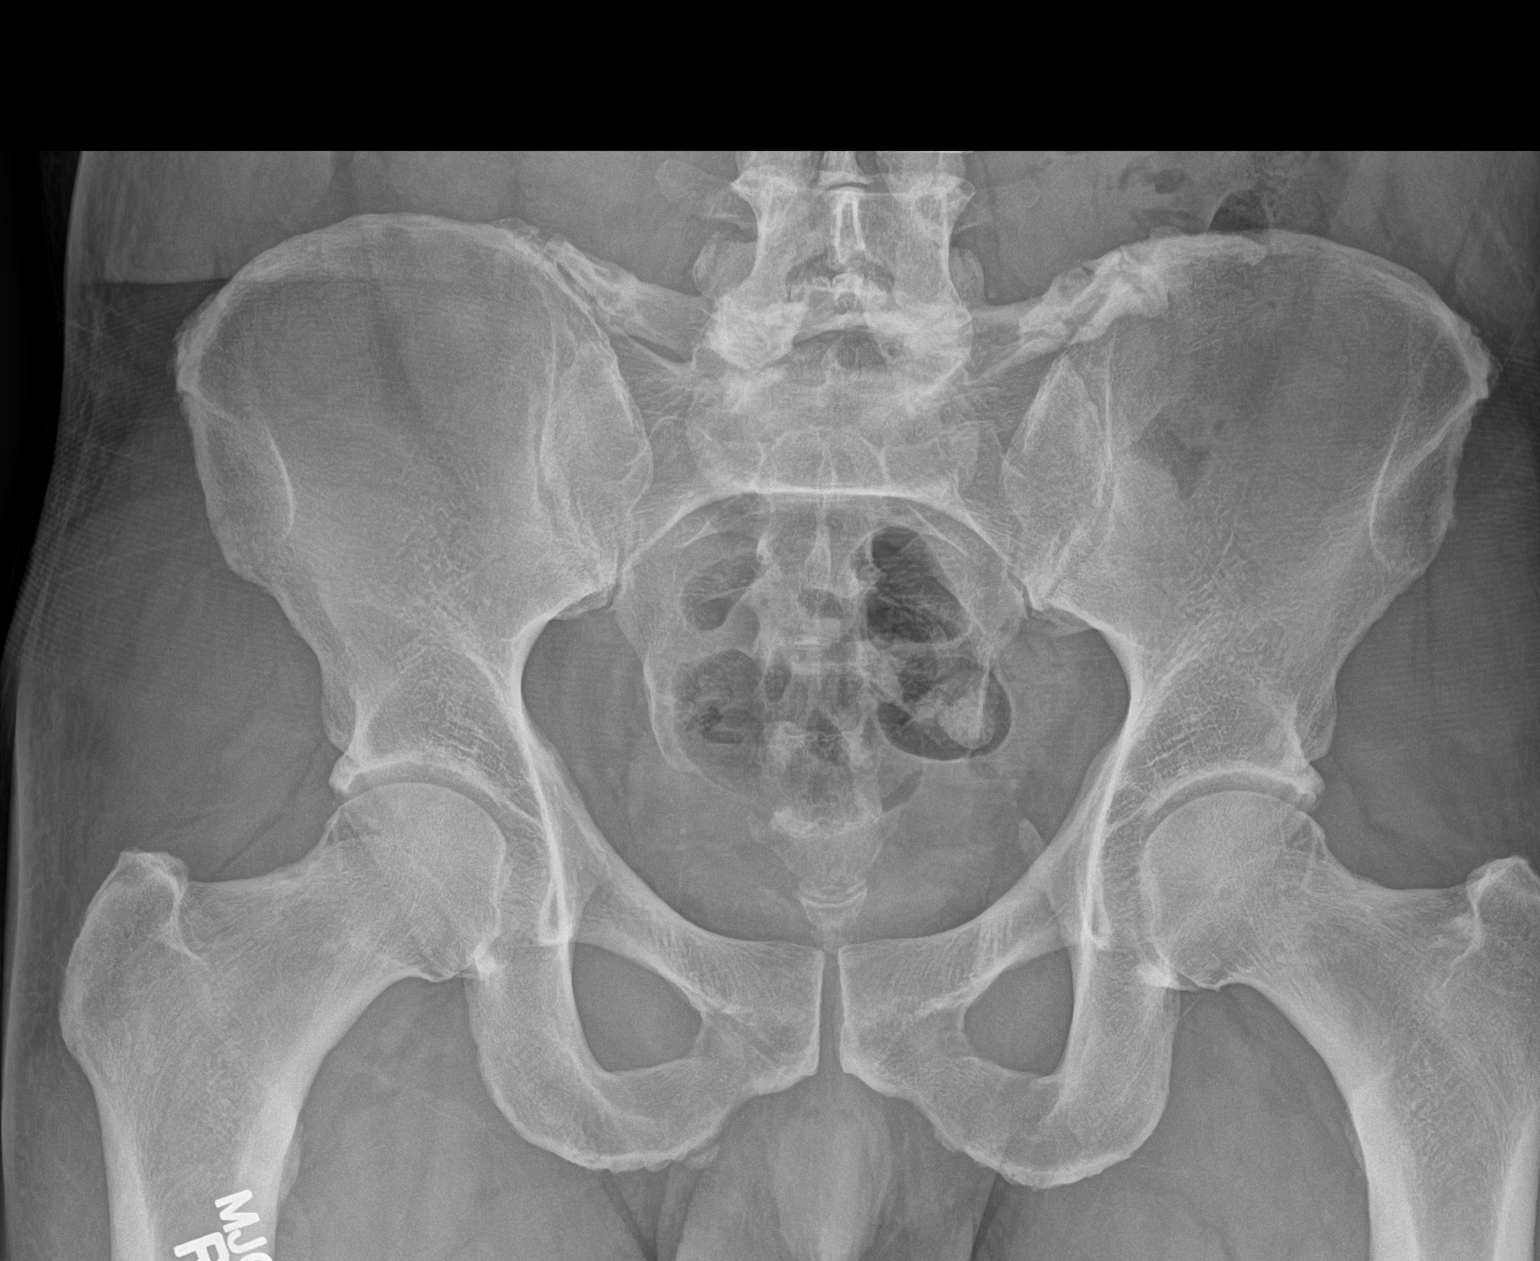

[pelvis ap (2 of 2)]
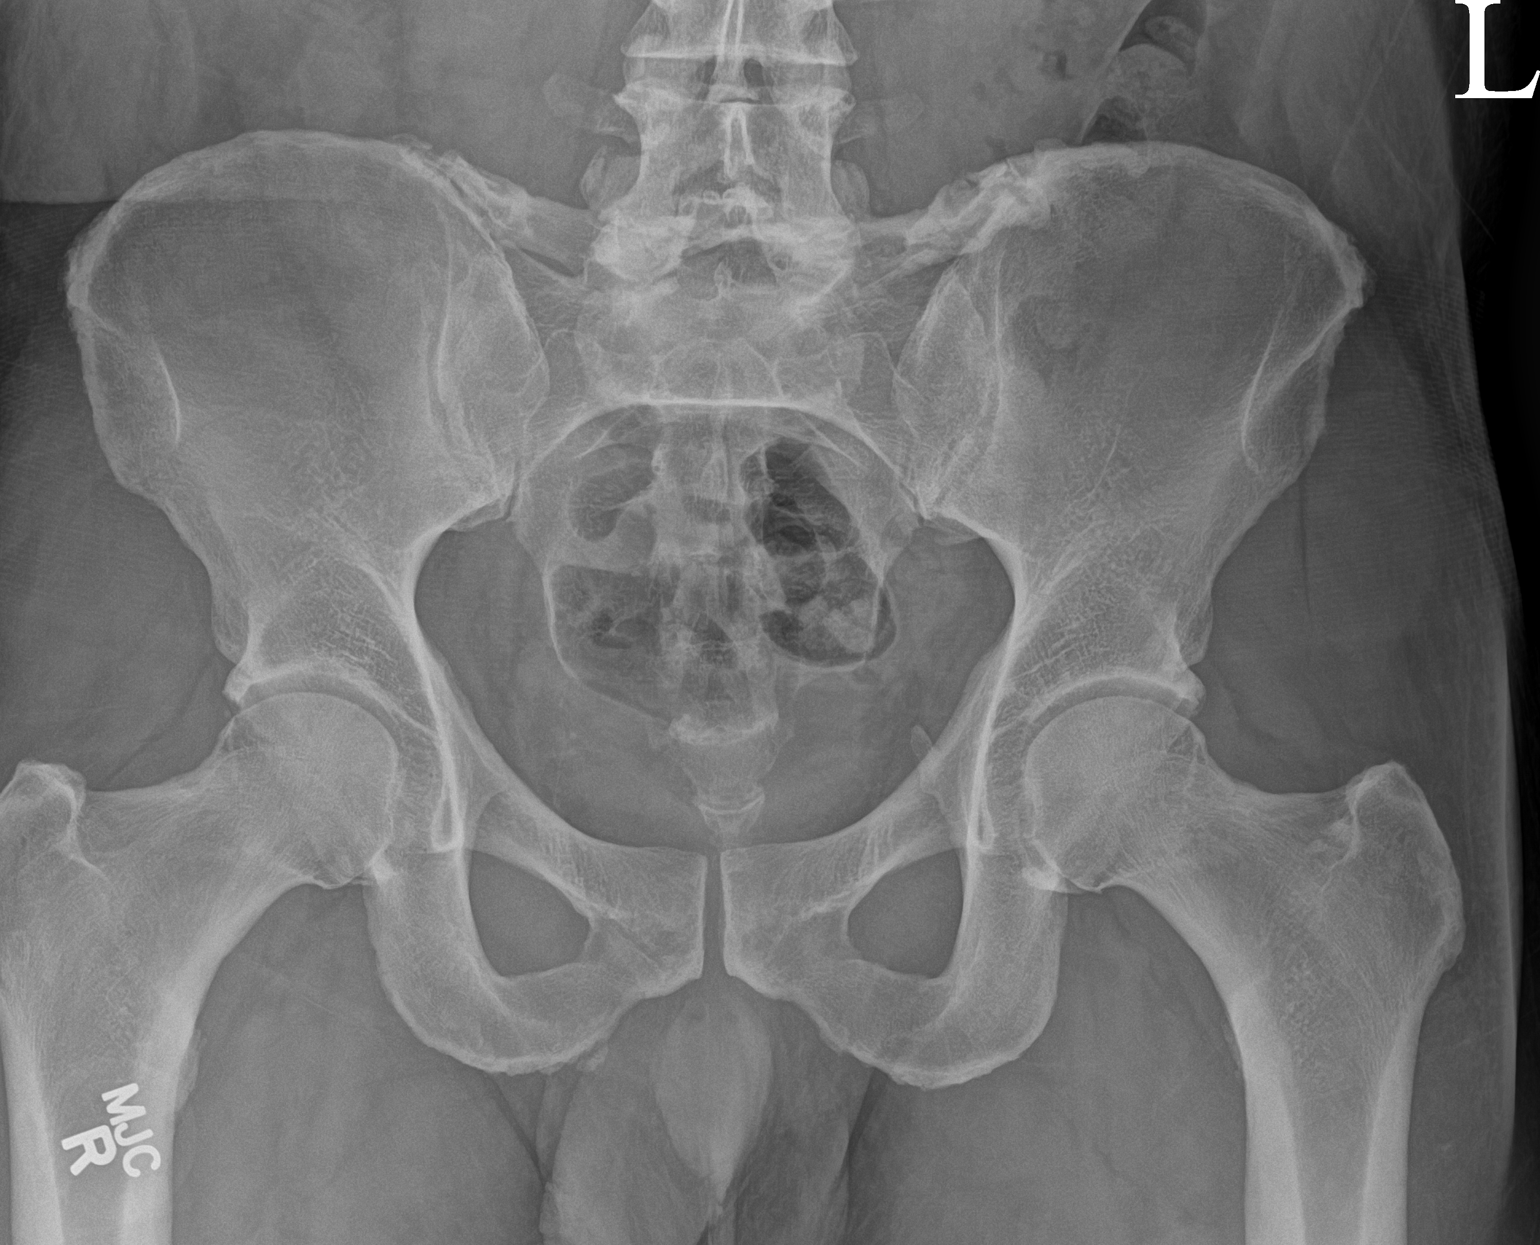

[2 of 2 positions shown; findings below may reference images not displayed]

FINDINGS: No fracture. Both femoral heads are well seated in the respective
acetabulum. Mild bilateral hip osteoarthritis with acetabular and
femoral head neck spurring. No avascular necrosis. There is
ossification of the sacral spinous ligaments. Pubic symphysis and
sacroiliac joints are congruent. Incidental osseous projection
arising from the medial left acetabulum, likely the ligamentous
calcification.
IMPRESSION: Mild bilateral hip osteoarthritis.

## 2021-01-07 IMAGING — DX DG LUMBAR SPINE 2-3V
3 series · 3 of 3 positions shown · non-contrast
Comparison: Lumbar radiograph [DATE]

CLINICAL DATA: Chronic midline low back pain without sciatica.

EXAM:
LUMBAR SPINE - 2-3 VIEW

[l-spine ap]
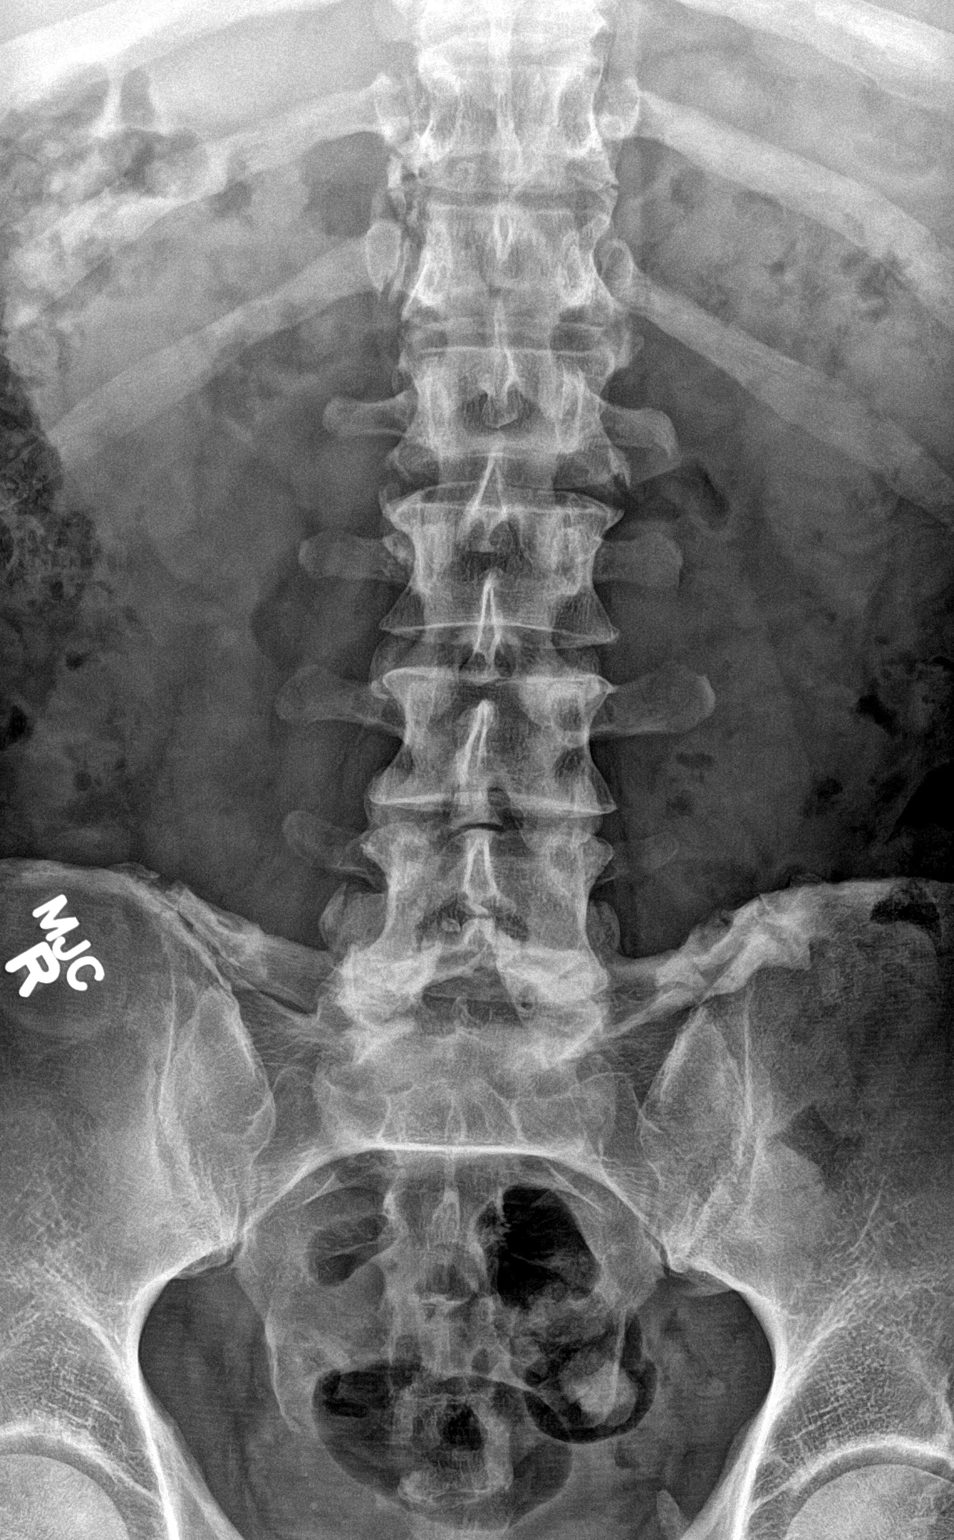

[l-spine lateral]
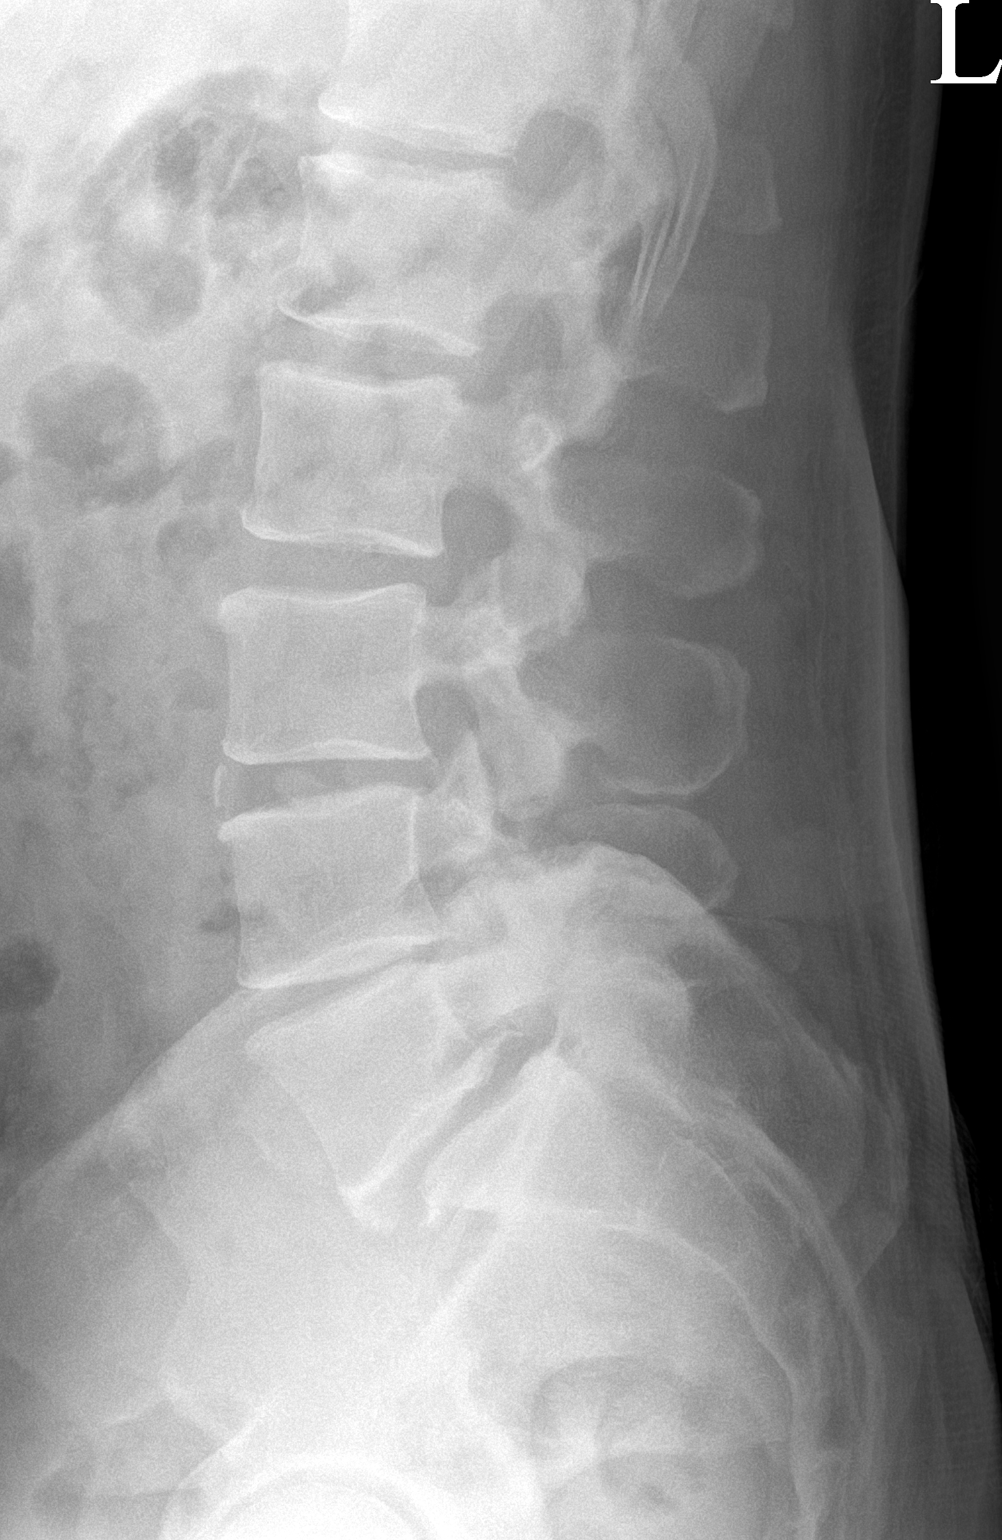

[l-spine spot]
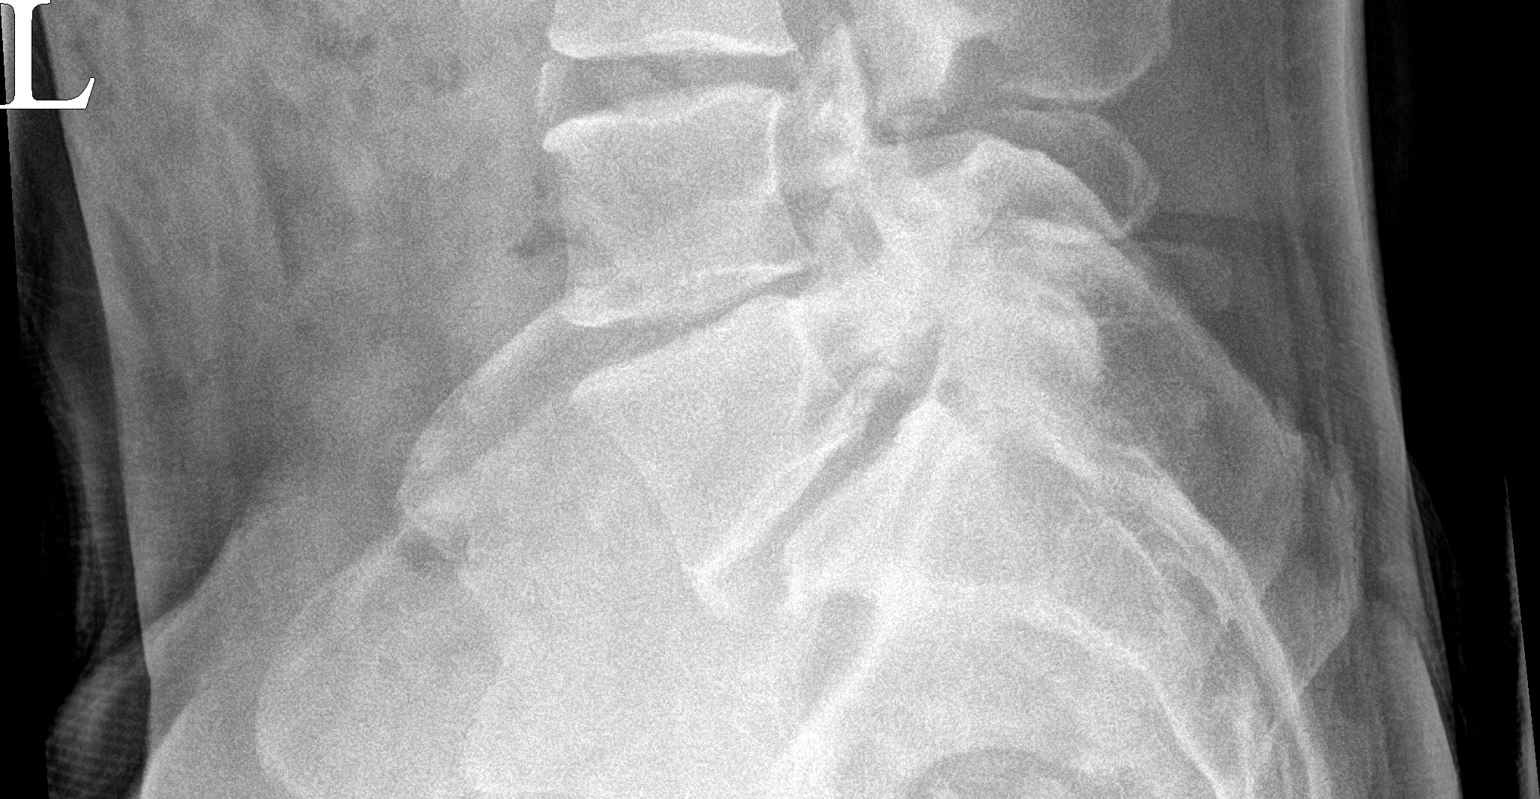

[3 of 3 positions shown; findings below may reference images not displayed]

FINDINGS: There are 5 lumbar type vertebra. Grade 1 anterolisthesis of L5 on
S1, no definite pars defects are seen. There is L5-S1 facet
hypertrophy and degenerative disc disease. Ossification of the
sacral spinous ligaments. Trace retrolisthesis of L4 on L5. Mild
L4-L5 disc space narrowing. Vertebral body heights are normal. No
evidence of fracture or bony destruction.
IMPRESSION: 1. Grade 1 anterolisthesis of L5 on S1. There is L5-S1 facet
hypertrophy, no definite pars defects are seen.
2. Degenerative disc disease at L5-S1. Mild L4-L5 disc space
narrowing.

## 2021-01-07 NOTE — Assessment & Plan Note (Addendum)
History of spondylolisthesis.  Patient is having worsening back pain and some lower leg pain.  Has noticed some increasing in weakness.  We have attempted osteopathic manipulation but patient feels like he is worsening over the course of time now.  Discussed with patient icing regimen and home exercises.  Patient is to increase activity slowly.  Patient has had abnormal x-rays previously.

## 2021-01-07 NOTE — Patient Instructions (Addendum)
Xray today Brett [name] [phone] Call Today See you again in 4-6 weeks

## 2021-01-22 ENCOUNTER — Other Ambulatory Visit: Payer: Self-pay | Admitting: Family Medicine

## 2021-01-22 DIAGNOSIS — M791 Myalgia, unspecified site: Secondary | ICD-10-CM

## 2021-02-01 ENCOUNTER — Telehealth: Payer: Self-pay | Admitting: Family Medicine

## 2021-02-01 NOTE — Telephone Encounter (Signed)
[facility] called, he is scheduled for 02/10/2021. His auth expires 02/06/2021.  They cannot get him in any earlier as this is a dual study, need auth extended.

## 2021-02-01 NOTE — Telephone Encounter (Signed)
[facility] called

## 2021-02-03 NOTE — Telephone Encounter (Signed)
Taren with [facility] called stating that the patient is scheduled for 11/16 and is needing authorization for both MRIs.

## 2021-02-03 NOTE — Telephone Encounter (Signed)
Spoke to [facility] and they will not allow me to extend the prior auth dates. There will need to be a whole new prior auth initiated again.

## 2021-02-05 ENCOUNTER — Encounter: Payer: Self-pay | Admitting: Family Medicine

## 2021-02-06 MED ORDER — ALPRAZOLAM 0.5 MG PO TABS: 0.5000 mg | ORAL_TABLET | ORAL | 0 refills | Status: DC

## 2021-02-09 NOTE — Telephone Encounter (Signed)
Pre-cert re approved.

## 2021-02-10 ENCOUNTER — Ambulatory Visit
Admission: RE | Admit: 2021-02-10 | Discharge: 2021-02-10 | Disposition: A | Discharge status: Home / Self Care | Payer: BC Managed Care – PPO | Source: Ambulatory Visit | Attending: Family Medicine | Admitting: Family Medicine

## 2021-02-10 ENCOUNTER — Other Ambulatory Visit: Payer: Self-pay

## 2021-02-10 DIAGNOSIS — G8929 Other chronic pain: Secondary | ICD-10-CM

## 2021-02-10 DIAGNOSIS — M545 Low back pain, unspecified: Secondary | ICD-10-CM

## 2021-02-10 IMAGING — MR MR PELVIS WO/W CM
5 of 9 series · 29 of 48 positions shown · IV contrast (20ML MULTIHANCE)
Comparison: None.

CLINICAL DATA: Back pain, hip pain for several years

EXAM:
MRI PELVIS WITHOUT AND WITH CONTRAST
TECHNIQUE: Multiplanar multisequence MR imaging of the pelvis was performed
both before and after administration of intravenous contrast.
CONTRAST:  20mL MULTIHANCE GADOBENATE DIMEGLUMINE 529 MG/ML IV SOLN

[Series 3: STIR · coronal · 4.0mm · 0.70mm/px · 5 of 31 slices shown]
[im 1/31]
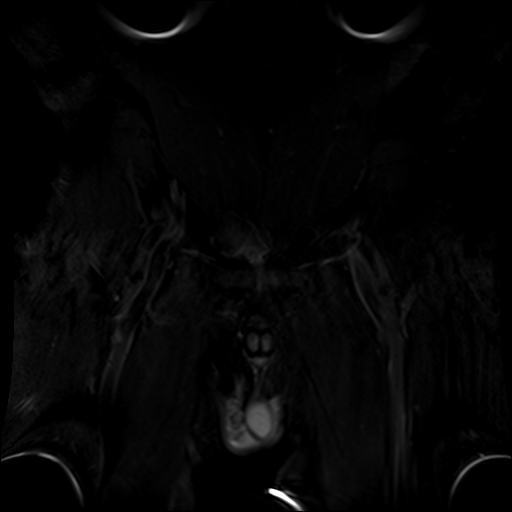
[im 8/31]
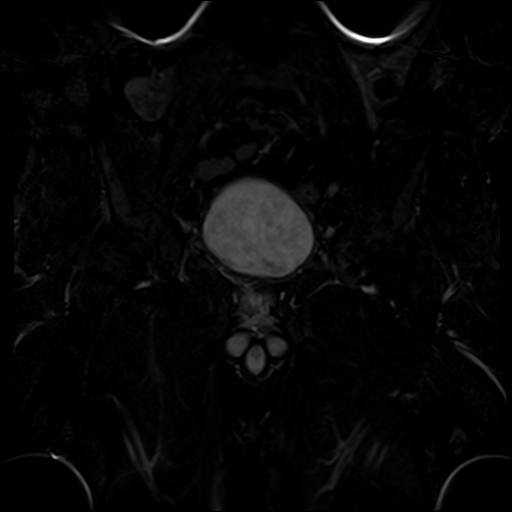
[im 16/31]
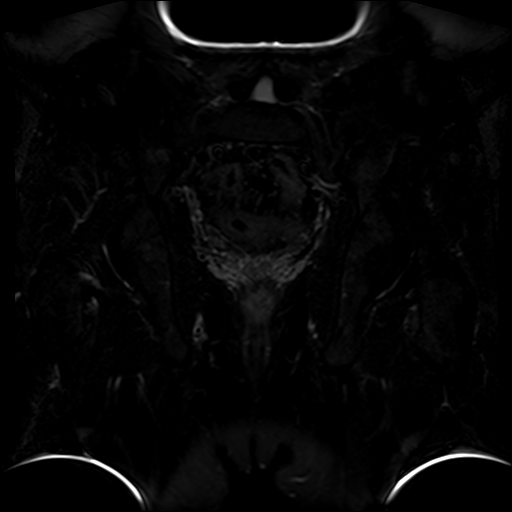
[im 23/31]
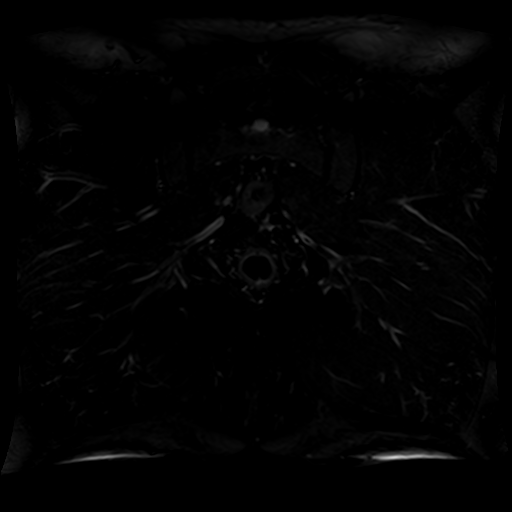
[im 31/31]
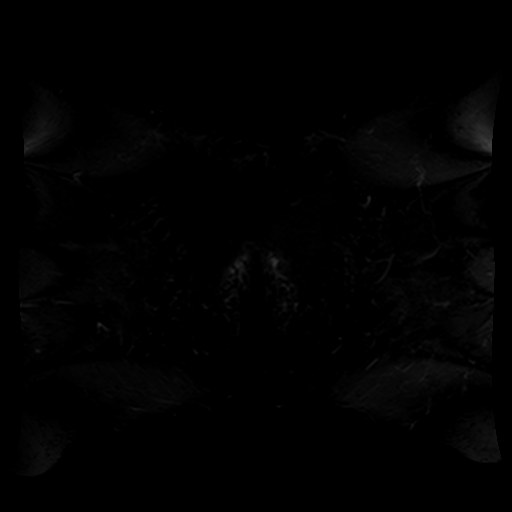

[Series 4: T1 · coronal · 4.0mm · 1.12mm/px · 5 of 31 slices shown (1 of 2)]
[im 1/31]
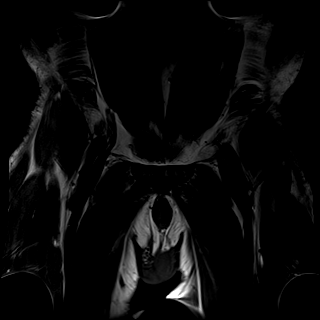
[im 8/31]
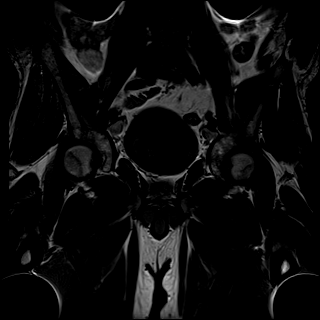
[im 16/31]
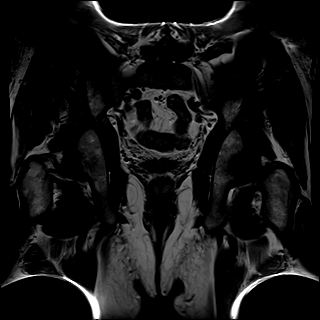
[im 23/31]
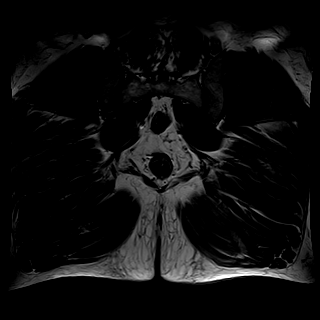
[im 31/31]
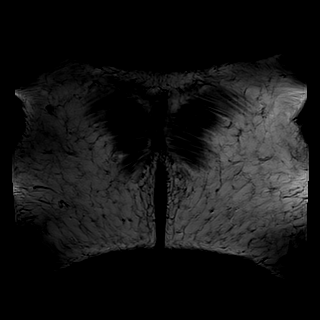

[Series 5: T1 · axial · 4.0mm · 1.00mm/px · z∈[-97,+130]mm · 6 of 43 slices shown (2 of 2)]
[im 1/43]
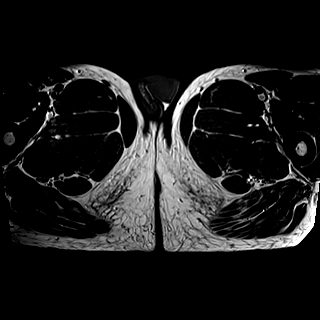
[im 9/43]
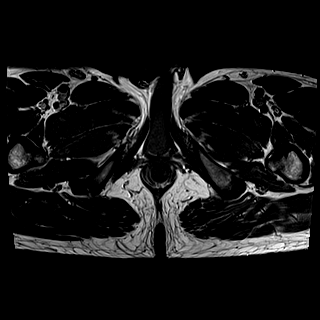
[im 17/43]
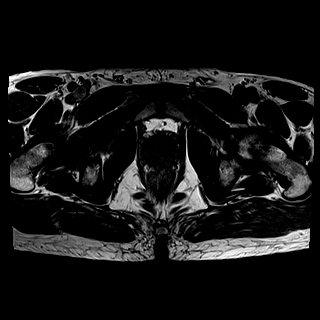
[im 26/43]
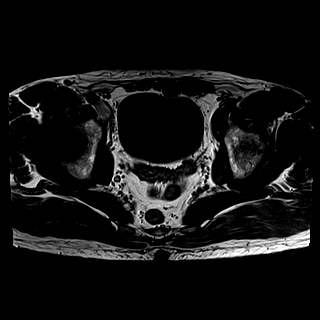
[im 34/43]
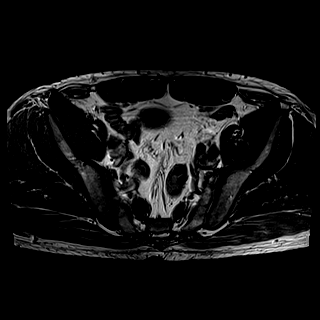
[im 43/43]
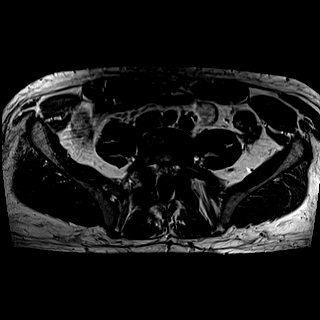

[Series 6: T2 fat-sat · axial · 4.0mm · 1.00mm/px · z∈[-107,+120]mm · 6 of 43 slices shown (1 of 2)]
[im 1/43]
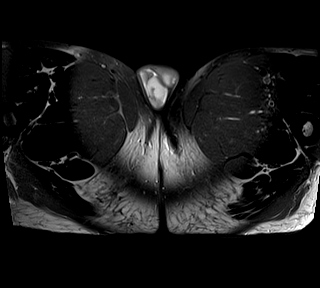
[im 9/43]
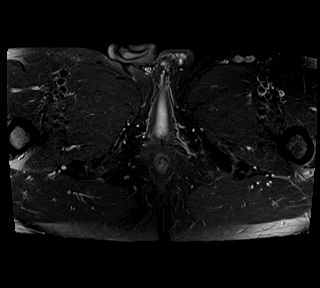
[im 17/43]
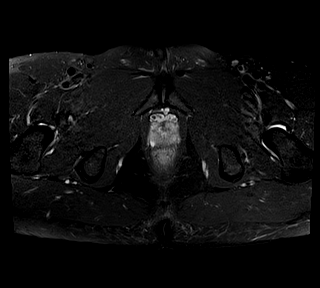
[im 26/43]
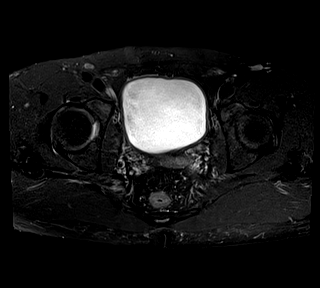
[im 34/43]
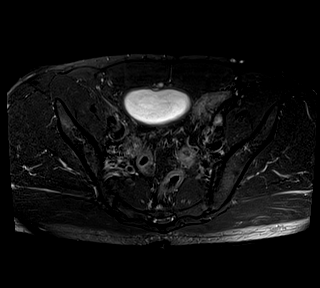
[im 43/43]
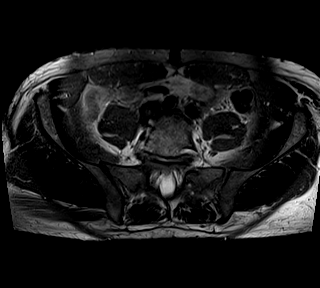

[Series 7: T2 fat-sat · sagittal · 4.0mm · 0.51mm/px · 7 of 53 slices shown (2 of 2)]
[im 1/53]
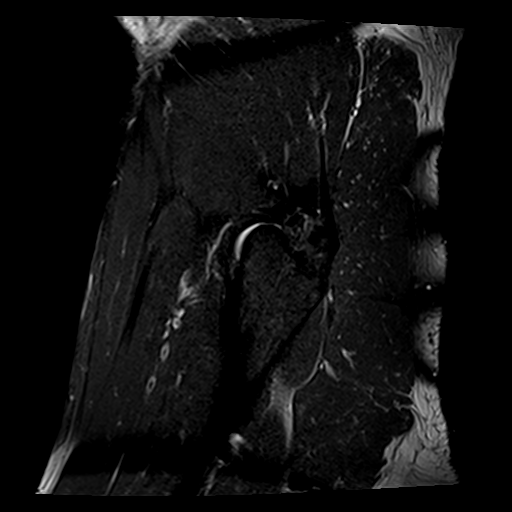
[im 8/53]
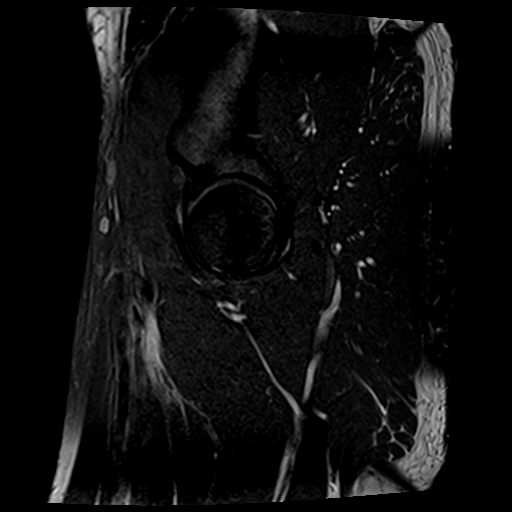
[im 15/53]
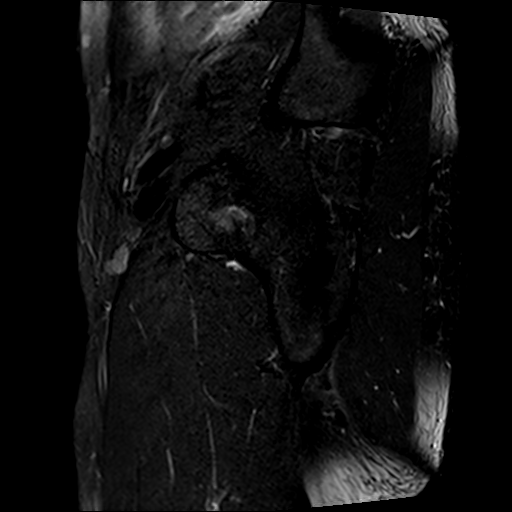
[im 23/53]
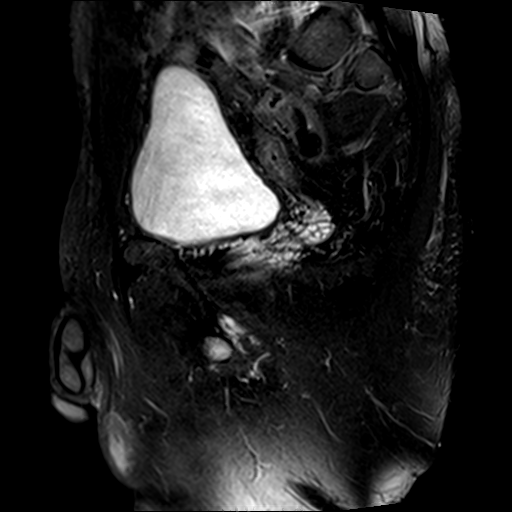
[im 30/53]
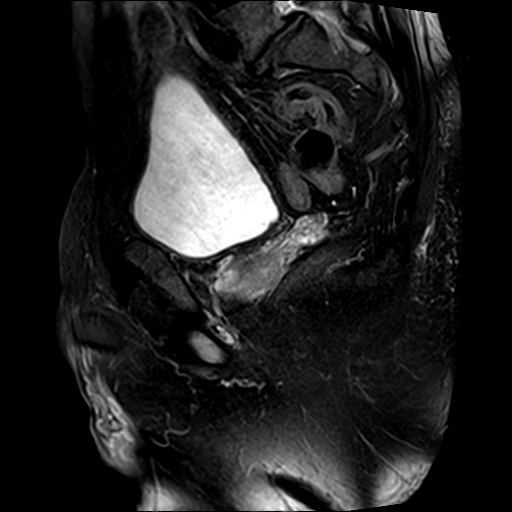
[im 38/53]
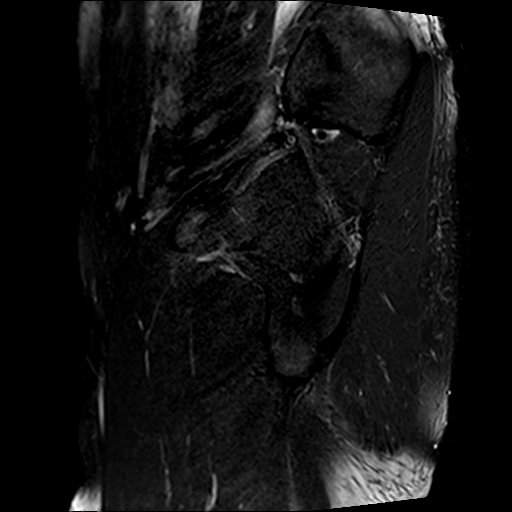
[im 45/53]
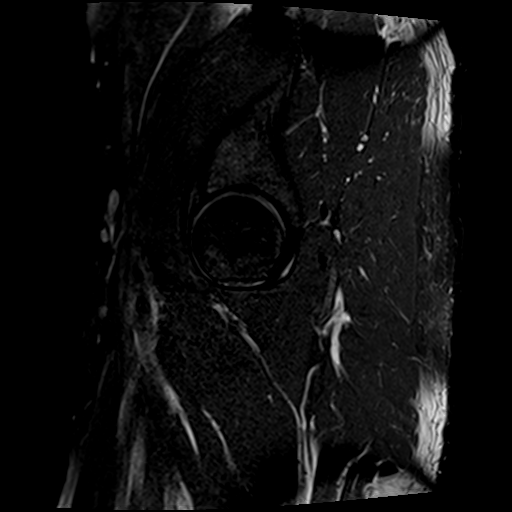

[29 of 48 positions shown; findings below may reference images not displayed]

FINDINGS: Bones:

No hip fracture, dislocation or avascular necrosis.

No periosteal reaction or bone destruction. No aggressive osseous
lesion.

Normal sacrum and sacroiliac joints. No SI joint widening or erosive
changes.

Degenerative disease with disc height loss at L4-5 and L5-S1. at
L5-S1 there is grade 1 anterolisthesis of L5 on S1 with a pseudodisc
bulge, moderate bilateral facet arthropathy and moderate bilateral
foraminal stenosis.

Articular cartilage and labrum

Articular cartilage:  No chondral defect.

Labrum: Grossly intact, but evaluation is limited by lack of
intraarticular fluid.

Joint or bursal effusion

Joint effusion:  No hip joint effusion.  No SI joint effusion.

Bursae:  No bursa formation.

Muscles and tendons

No muscle atrophy. No muscle edema. No intramuscular fluid
collection or hematoma.

Other findings

No pelvic free fluid. No fluid collection or hematoma. No inguinal
lymphadenopathy. No inguinal hernia.
IMPRESSION: 1. No acute injury of the pelvis.
2. Lower lumbar spine spondylosis as described above, better
characterized on MRI of the lumbar spine performed earlier same day.

## 2021-02-10 IMAGING — MR MR LUMBAR SPINE W/O CM
4 of 5 series · 25 of 48 positions shown · non-contrast
Comparison: Lumbar radiographs [DATE].

CLINICAL DATA: 45-year-old male with persistent low back and leg
pain.

EXAM:
MRI LUMBAR SPINE WITHOUT CONTRAST
TECHNIQUE: Multiplanar, multisequence MR imaging of the lumbar spine was
performed. No intravenous contrast was administered.

[Series 2: T2 · sagittal · 4.0mm · 0.59mm/px · 6 of 15 slices shown (1 of 2)]
[im 1/15]
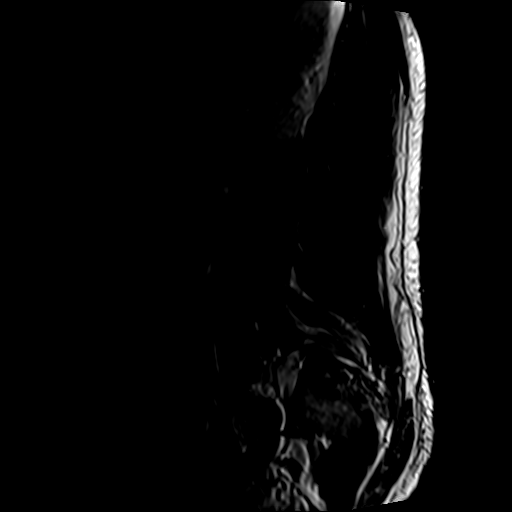
[im 3/15]
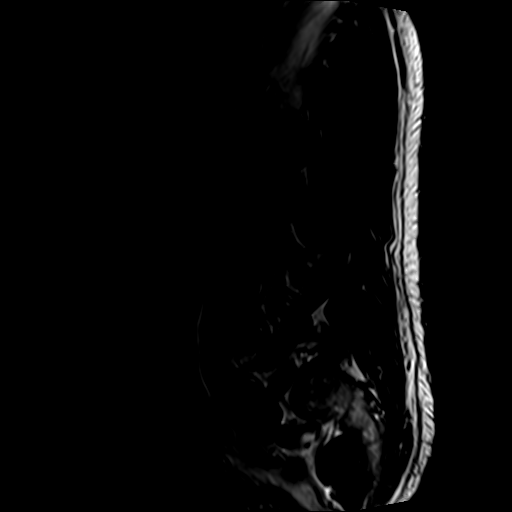
[im 6/15]
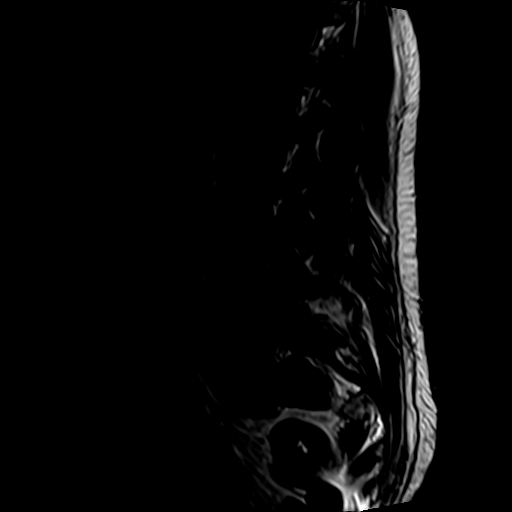
[im 9/15]
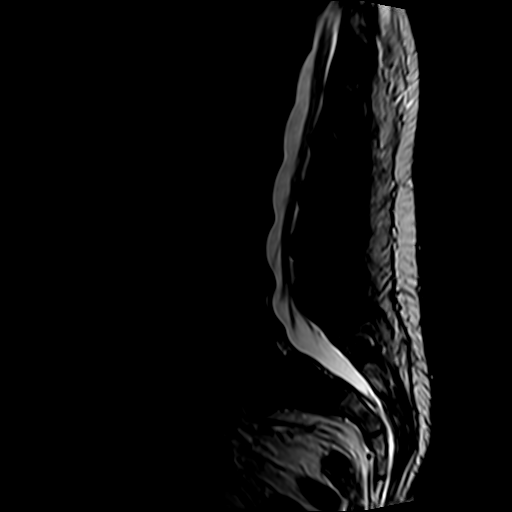
[im 12/15]
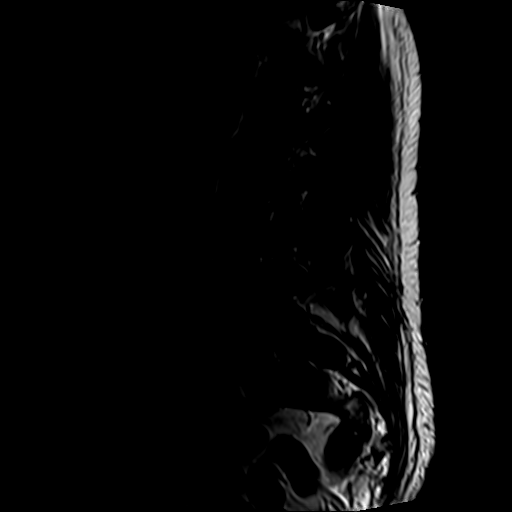
[im 15/15]
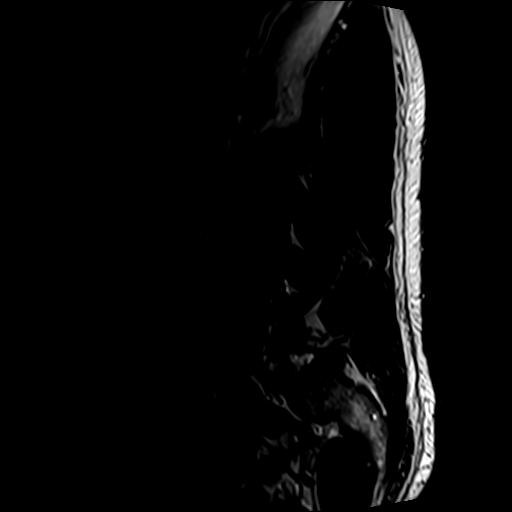

[Series 4: T1 · sagittal · 4.0mm · 0.59mm/px · 6 of 15 slices shown (1 of 2)]
[im 1/15]
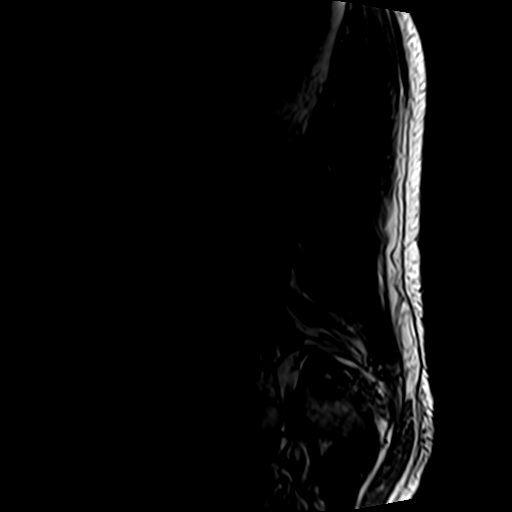
[im 3/15]
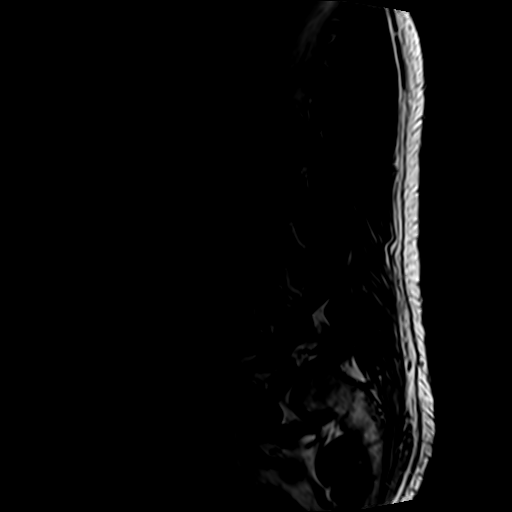
[im 6/15]
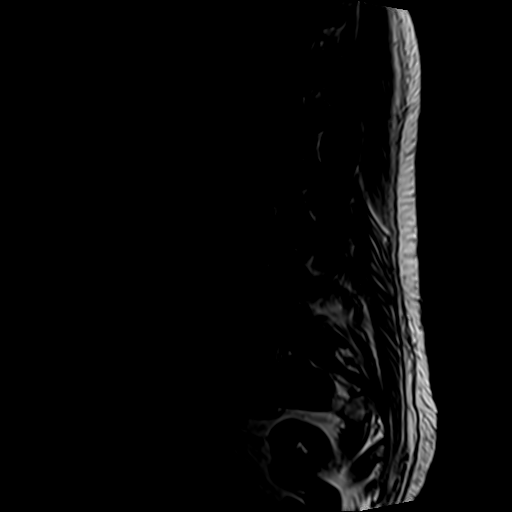
[im 9/15]
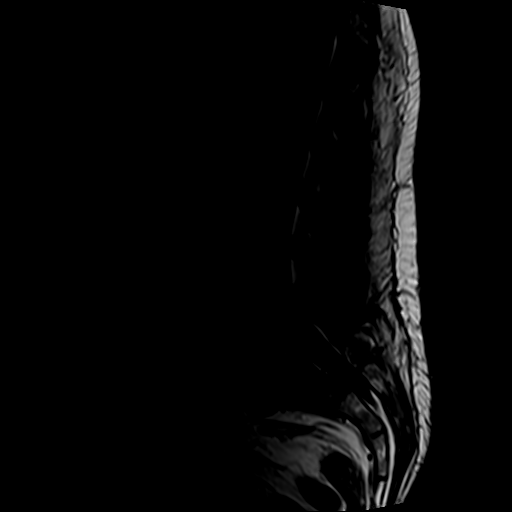
[im 12/15]
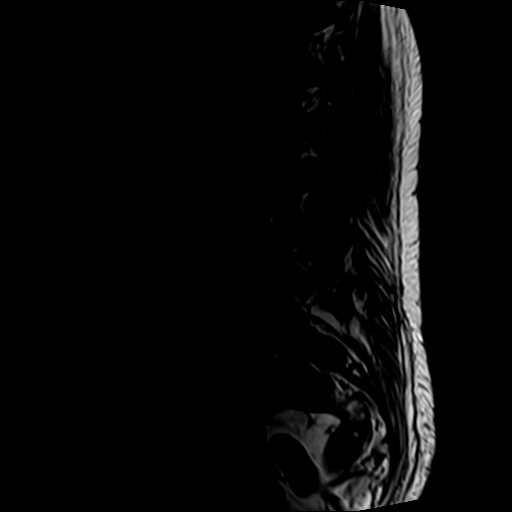
[im 15/15]
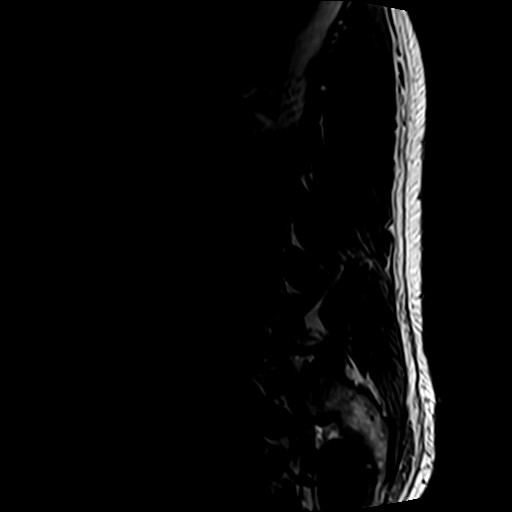

[Series 5: T2 · axial · 4.0mm · 0.70mm/px · z∈[-101,+119]mm · 9 of 39 slices shown (2 of 2)]
[im 1/39]
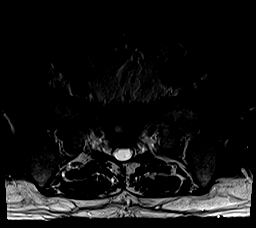
[im 6/39]
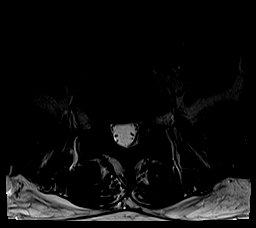
[im 11/39]
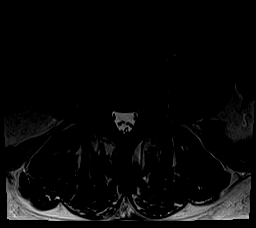
[im 17/39]
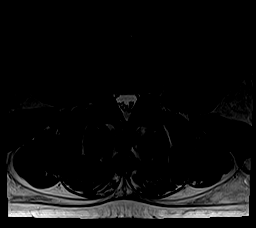
[im 20/39]
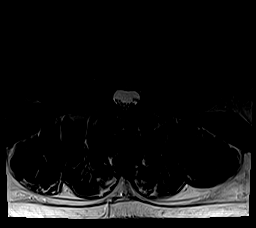
[im 22/39]
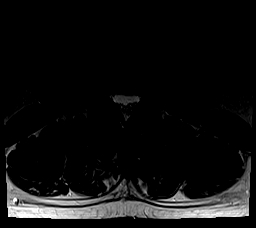
[im 28/39]
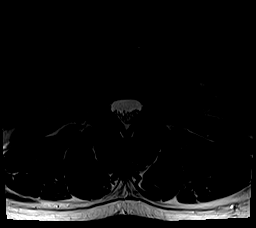
[im 33/39]
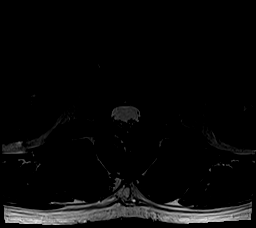
[im 39/39]
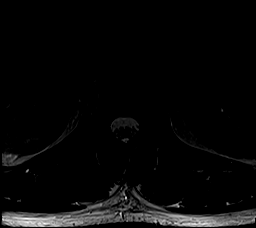

[Series 6: T1 · axial · 4.0mm · 0.35mm/px · z∈[-101,+88]mm · 4 of 39 slices shown (2 of 2)]
[im 1/39]
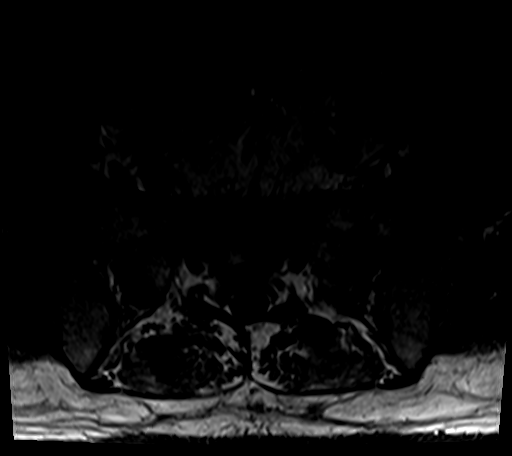
[im 6/39]
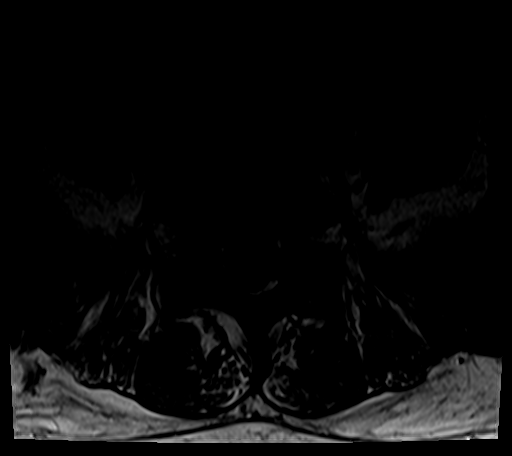
[im 20/39]
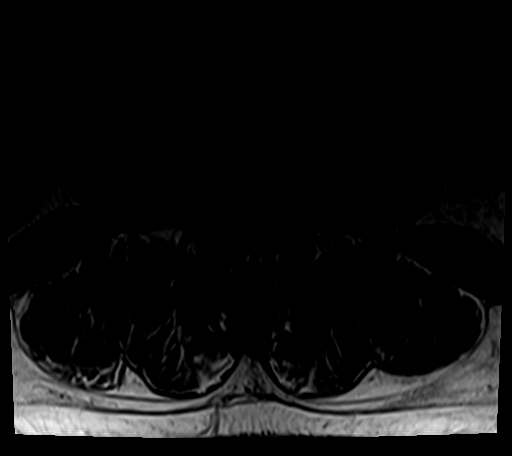
[im 33/39]
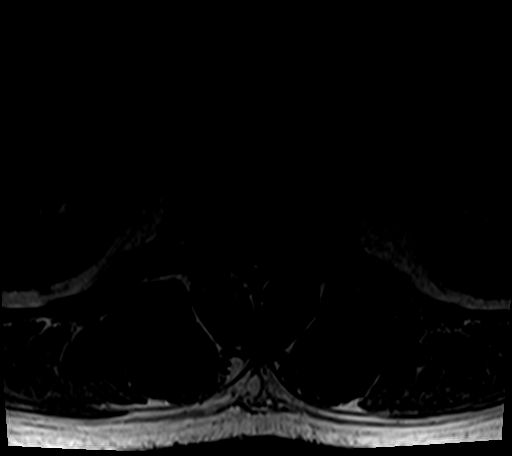

[25 of 48 positions shown; findings below may reference images not displayed]

FINDINGS: Segmentation:  Normal on the comparison.

Alignment: Anterolisthesis of L5 on S1 measures up to 9 mm. Mild
superimposed retrolisthesis of L4 on L5. Mild straightening of
lumbar lordosis elsewhere.

Vertebrae: No marrow edema or evidence of acute osseous abnormality.
Visualized bone marrow signal is within normal limits. Intact
visible sacrum and SI joints.

Conus medullaris and cauda equina: Conus extends to the T12 level.
No lower spinal cord or conus signal abnormality. Fairly capacious
underlying spinal canal.

Paraspinal and other soft tissues: Negative; small right renal
midpole cyst.

Disc levels:

T11-T12: Negative.

T12-L1:  Negative.

L1-L2:  Negative.

L2-L3: Mild disc desiccation, disc space loss and circumferential
disc bulge. Posterior midline annular fissure of the disc (series 5,
image 17). Mild facet hypertrophy. No stenosis.

L3-L4: Disc desiccation. Possible trace vacuum disc. Circumferential
disc bulge with superimposed central and caudal disc extrusion
(series 5, image 25). Mild to moderate facet hypertrophy. No
significant spinal stenosis. Both lateral recesses are effaced at
the descending L4 nerve levels. No significant foraminal stenosis.

L4-L5: Disc desiccation and disc space loss. Circumferential disc
bulge and endplate spurring with bulky broad-based posterior
component. Mild to moderate facet hypertrophy. No spinal or
convincing lateral recess stenosis. Mild L4 foraminal stenosis
greater on the right.

L5-S1: Anterolisthesis with disc space loss. Circumferential
disc/pseudo disc with endplate spurring and moderate facet
hypertrophy. No spinal or lateral recess stenosis. Moderate
bilateral L5 foraminal stenosis.
IMPRESSION: 1. Lumbar disc and posterior element degeneration L2-L3 through
L5-S1, with grade 1 spondylolisthesis up to 9 mm at the latter.
Small caudal midline disc extrusion at L3-L4.

2. Capacious underlying spinal canal, no significant lumbar spinal
stenosis. Up to mild bilateral lateral recess stenosis at the L4
nerve levels. And moderate bilateral L5 neural foraminal stenosis.

## 2021-02-10 MED ORDER — GADOBENATE DIMEGLUMINE 529 MG/ML IV SOLN
20.0000 mL | Freq: Once | INTRAVENOUS | Status: AC | PRN
  Administered 2021-02-10: 20 mL via INTRAVENOUS

## 2021-02-11 ENCOUNTER — Ambulatory Visit: Payer: BC Managed Care – PPO | Admitting: Family Medicine

## 2021-02-22 ENCOUNTER — Other Ambulatory Visit: Payer: Self-pay | Admitting: Family Medicine

## 2021-02-24 MED ORDER — LISDEXAMFETAMINE DIMESYLATE 60 MG PO CAPS: 60.0000 mg | ORAL_CAPSULE | ORAL | 0 refills | Status: DC

## 2021-03-11 NOTE — Progress Notes (Signed)
[place] Phone: [phone] Subjective:   Brett Brett [name], am serving as a scribe for Dr. Hulan Saas. This visit occurred during the SARS-CoV-2 public health emergency.  Safety protocols were in place, including screening questions prior to the visit, additional usage of staff PPE, and extensive cleaning of exam room while observing appropriate contact time as indicated for disinfecting solutions.   I'm seeing this patient by the request  of:  Brett Arnt, MD  CC: Low back pain, left knee pain  [id number]  Brett Brett [name] is a 45 y.o. male coming in with complaint of back and neck pain. OMT 01/07/2021. Patient states that he continues to have pain in lower back and SI joint. Does not feel better since last visit.  Patient did have an MRI.  Found to have a significant amount of arthritic changes, facet arthritis as well as nerve root impingement in the lower spine especially at the L4-L5 area.  Also complaining of anterior knee pain. Has sharp pain in L knee when kneeling down. Pain worse when moving lateral.   Complaining of pain in upper portion of sternum. Notes popping on L side.   Medications patient has been prescribed: None  Taking:         Reviewed prior external information including notes and imaging from previsou exam, outside providers and external EMR if available.   As well as notes that were available from care everywhere and other healthcare systems.  Past medical history, social, surgical and family history all reviewed in electronic medical record.  Brett [name] pertanent information unless stated regarding to the chief complaint.   Past Medical History:  Diagnosis Date   Asthma    Bipolar 1 disorder ([place]) 06/15/2016   Previously followed by Psychiatry. Hx of "anger issues." Hx of medication use has included Seroquel and Ativan, both of which have helped him in the past.    Chronic  pain of both knees 06/15/2016   Chronic seasonal allergic rhinitis due to pollen 06/15/2016   Depression    Gastroesophageal reflux disease without esophagitis 06/15/2016   History of diabetes mellitus 05/17/2019   Resolved after > 100 pound weight loss and lifestyle changes. 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 Brett [name] ongoing issues.   Morbid obesity ([place]) 06/15/2016    Brett [name] Known Allergies   Review of Systems:  Brett [name] headache, visual changes, nausea, vomiting, diarrhea, constipation, dizziness, abdominal pain, skin rash, fevers, chills, night sweats, weight loss, swollen lymph nodes, body aches, joint swelling, chest pain, shortness of breath, mood changes. POSITIVE muscle aches  Objective  Blood pressure (!) 136/96, pulse (!) 101, height 6' (1.829 m), weight 208 lb (94.3 kg), SpO2 98 %.   General: Brett [name] apparent distress alert and oriented x3 mood and affect normal, dressed appropriately.  HEENT: Pupils equal, extraocular movements intact  Respiratory: Patient's speak in full sentences and does not appear short of breath  Cardiovascular: Brett [name] lower extremity edema, non tender, Brett [name] erythema  Low back exam does have loss of lordosis.  Worsening pain with extension of the back.  Mild tightness with straight leg test for left greater than right with mild radicular symptoms. Left knee exam does have some tenderness to palpation.  Patient does have trace of fluid noted in the patellofemoral joint.  Crepitus noted.  Tender  to palpation over the medial joint line with narrowing noted.  Osteopathic findings   C6 flexed rotated and side bent left C7 flexed rotated and side bent right T6 extended rotated and side bent left inhaled rib L2 flexed rotated and side bent right L5 flexed rotated and side bent left Sacrum right on right   After informed written and verbal consent, patient  was seated on exam table. Left knee was prepped with alcohol swab and utilizing anterolateral approach, patient's left knee space was injected with 4:1  marcaine 0.5%: Kenalog 40mg /dL. Patient tolerated the procedure well without immediate complications.    Assessment and Plan: Chronic midline low back pain without sciatica Chronic problem with exacerbation.  Attempted again osteopathic manipulation.  Patient does have Celebrex for breakthrough pain.  Discussed with patient though with the MRI findings I do feel at L4-L5 epidural could be beneficial.  Patient will be scheduled for this.  Discussed icing regimen and home exercises.  We will continue to keep with the core stability.  Follow-up again in 4 to 8 weeks.    Nonallopathic problems  Decision today to treat with OMT was based on Physical Exam  After verbal consent patient was treated with HVLA, ME, FPR techniques in cervical, rib, thoracic, lumbar, and sacral  areas  Patient tolerated the procedure well with improvement in symptoms  Patient given exercises, stretches and lifestyle modifications  See medications in patient instructions if given  Patient will follow up in 4-8 weeks      The above documentation has been reviewed and is accurate and complete Brett Pulley, DO        Note: This dictation was prepared with Dragon dictation along with smaller phrase technology. Any transcriptional errors that result from this process are unintentional.

## 2021-03-12 ENCOUNTER — Encounter: Payer: Self-pay | Admitting: Family Medicine

## 2021-03-12 ENCOUNTER — Ambulatory Visit: Payer: BC Managed Care – PPO | Admitting: Family Medicine

## 2021-03-12 ENCOUNTER — Ambulatory Visit (INDEPENDENT_AMBULATORY_CARE_PROVIDER_SITE_OTHER): Payer: BC Managed Care – PPO

## 2021-03-12 ENCOUNTER — Other Ambulatory Visit: Payer: Self-pay

## 2021-03-12 VITALS — BP 136/96 | HR 101 | Ht 72.0 in | Wt 208.0 lb

## 2021-03-12 DIAGNOSIS — M9901 Segmental and somatic dysfunction of cervical region: Secondary | ICD-10-CM

## 2021-03-12 DIAGNOSIS — M25562 Pain in left knee: Secondary | ICD-10-CM

## 2021-03-12 DIAGNOSIS — M9908 Segmental and somatic dysfunction of rib cage: Secondary | ICD-10-CM | POA: Diagnosis not present

## 2021-03-12 DIAGNOSIS — M1712 Unilateral primary osteoarthritis, left knee: Secondary | ICD-10-CM | POA: Diagnosis not present

## 2021-03-12 DIAGNOSIS — M4317 Spondylolisthesis, lumbosacral region: Secondary | ICD-10-CM

## 2021-03-12 DIAGNOSIS — M545 Low back pain, unspecified: Secondary | ICD-10-CM | POA: Diagnosis not present

## 2021-03-12 DIAGNOSIS — G8929 Other chronic pain: Secondary | ICD-10-CM

## 2021-03-12 DIAGNOSIS — M9903 Segmental and somatic dysfunction of lumbar region: Secondary | ICD-10-CM | POA: Diagnosis not present

## 2021-03-12 DIAGNOSIS — M9904 Segmental and somatic dysfunction of sacral region: Secondary | ICD-10-CM | POA: Diagnosis not present

## 2021-03-12 DIAGNOSIS — M9902 Segmental and somatic dysfunction of thoracic region: Secondary | ICD-10-CM

## 2021-03-12 IMAGING — DX DG KNEE 3 VIEWS*L*
3 series · 3 of 3 positions shown · non-contrast
Comparison: [DATE]

CLINICAL DATA: Chronic pain, progressive

EXAM:
LEFT KNEE - 3 VIEW

[knee ap]
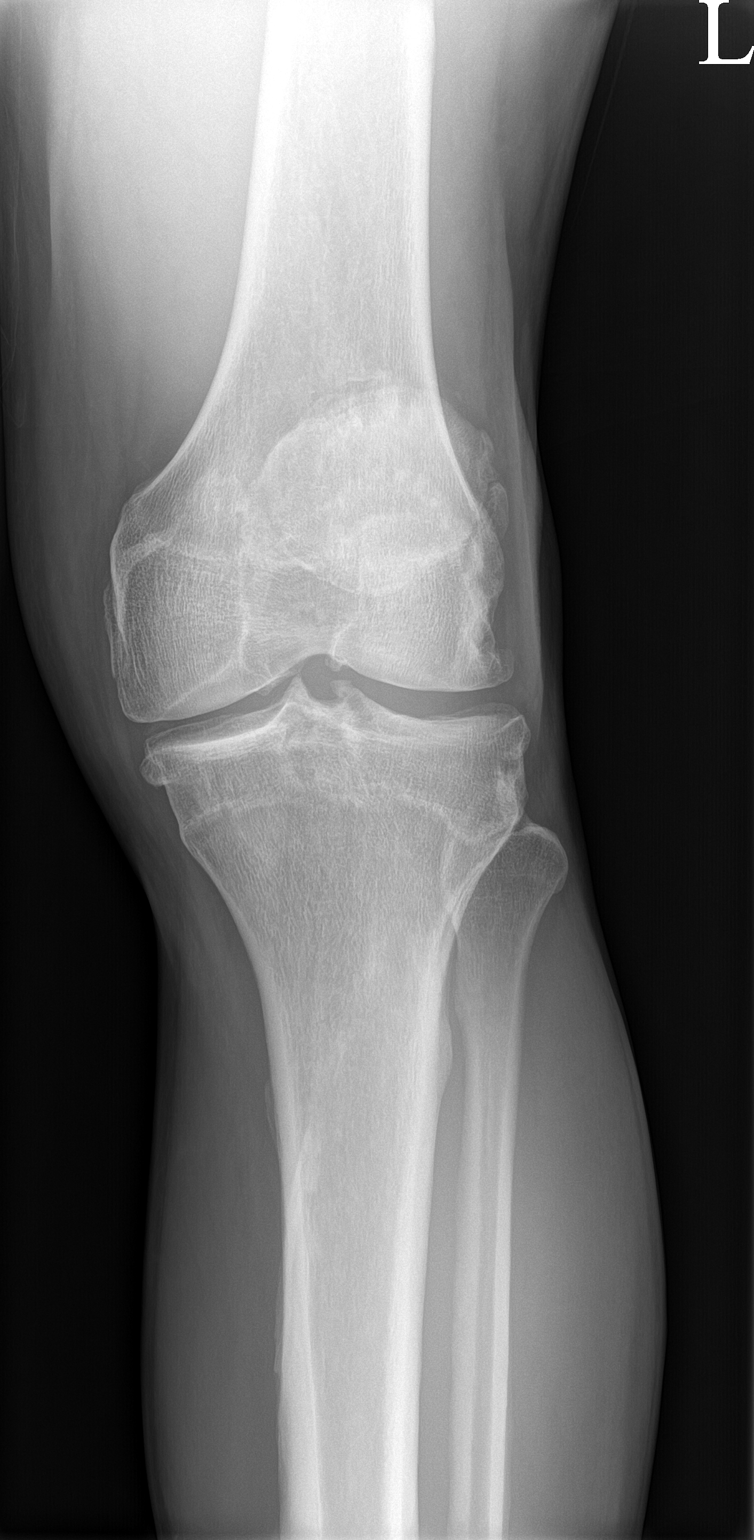

[knee lat]
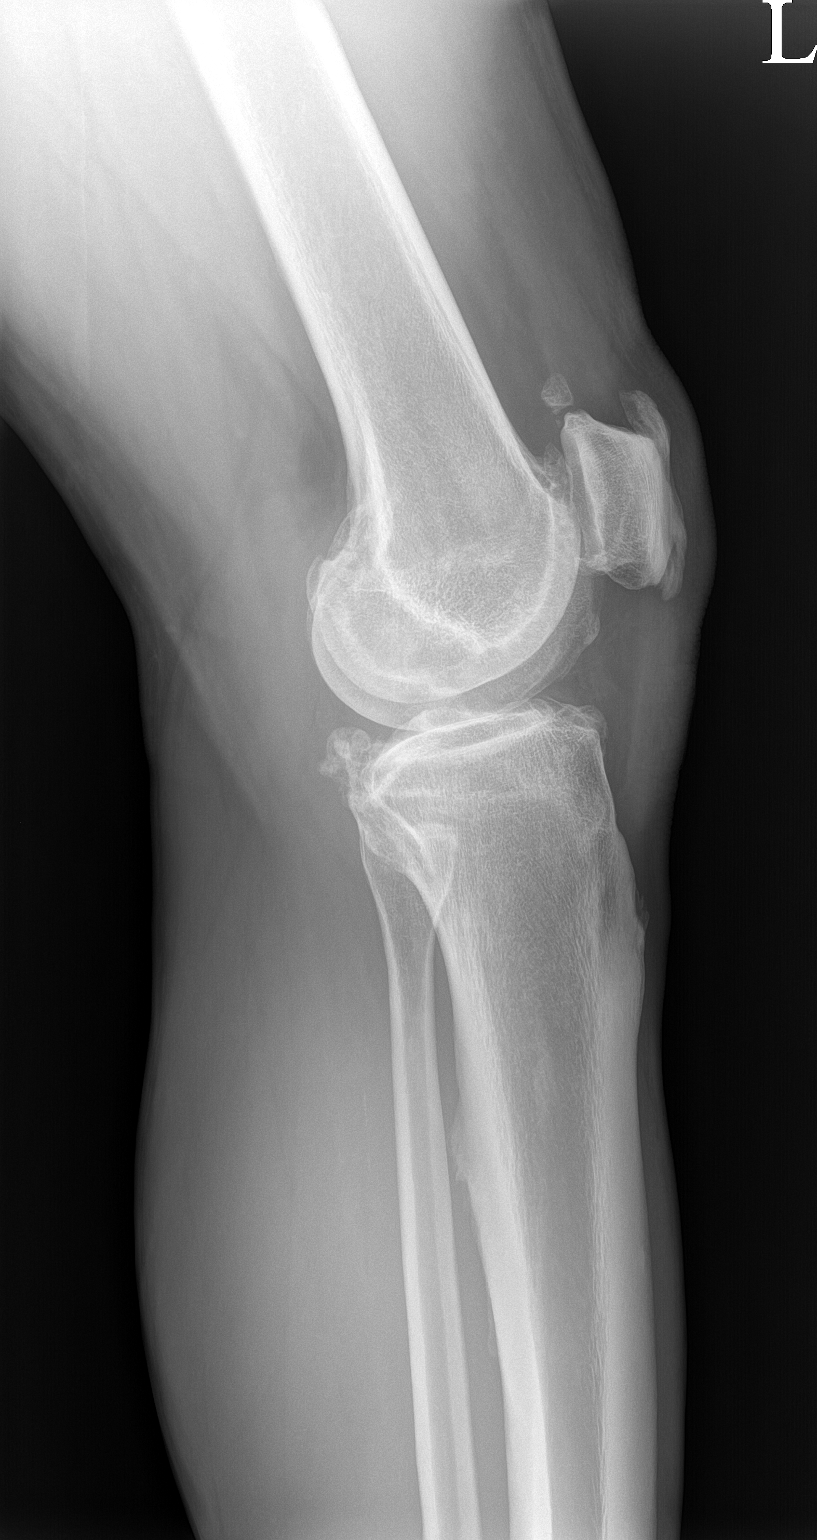

[patella]
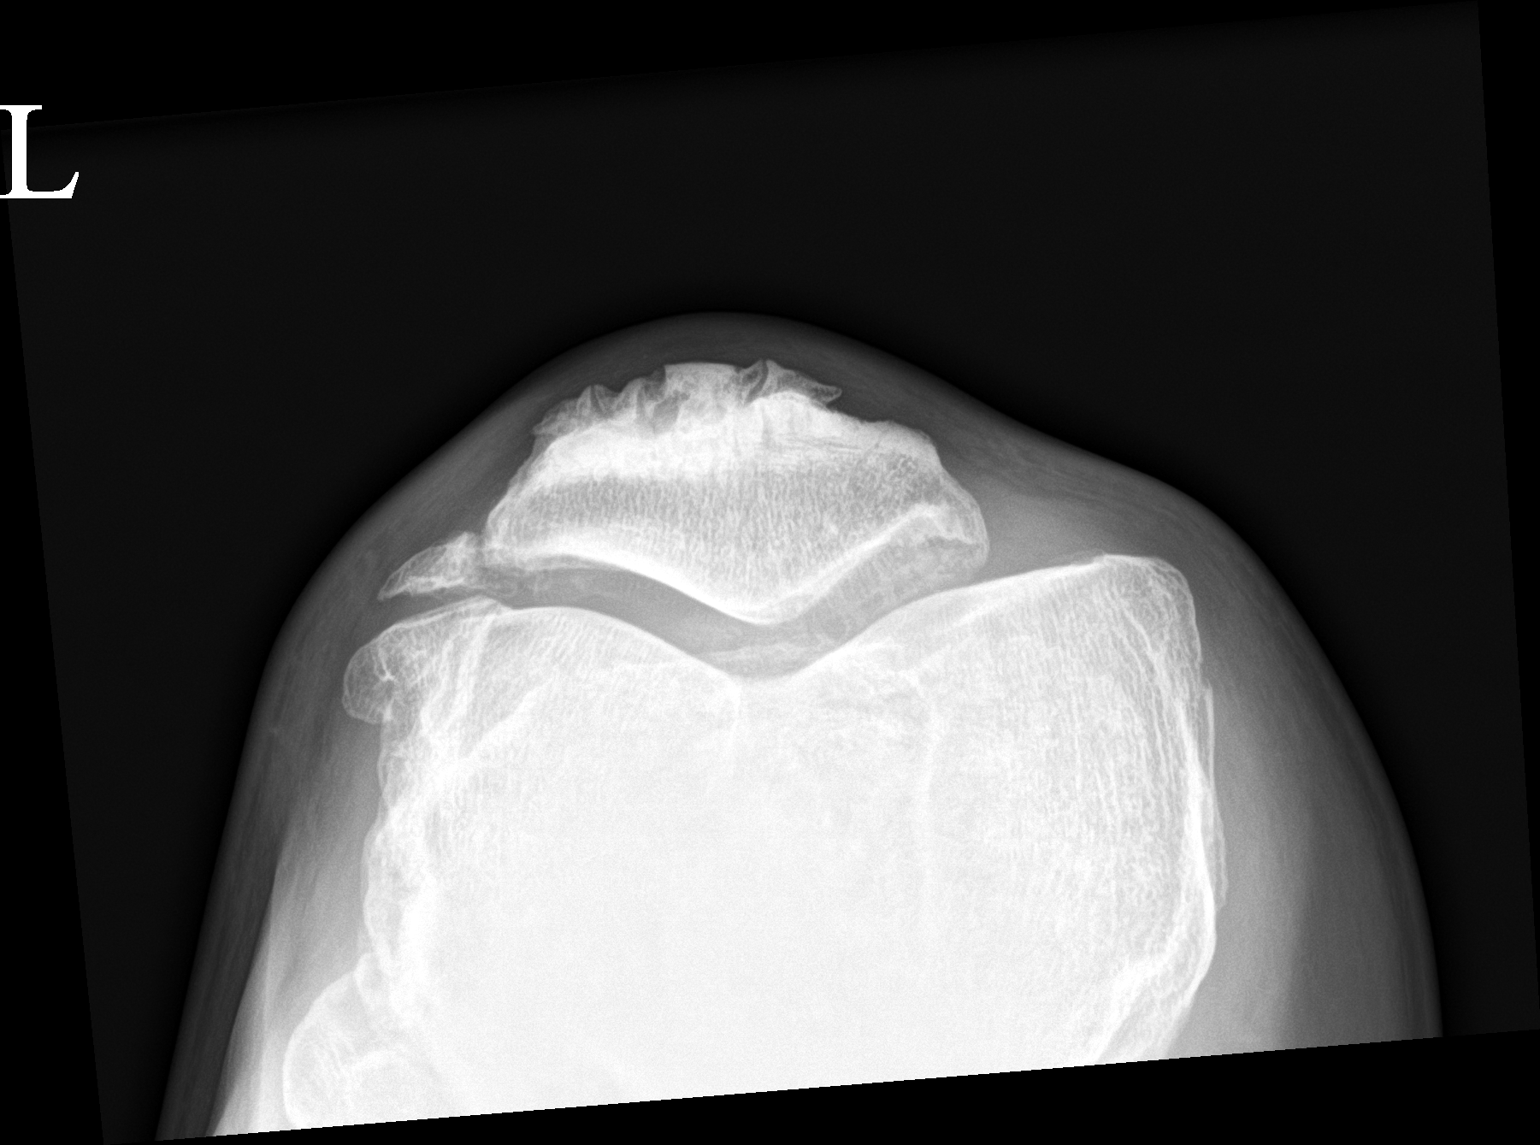

[3 of 3 positions shown; findings below may reference images not displayed]

FINDINGS: No fracture, dislocation, or effusion. Small marginal spurs about
all 3 compartments of the knee, progressive around the patella. Mild
narrowing of the articular cartilage in the medial compartment as
before. Regional soft tissues unremarkable. No radiodense foreign
body.
IMPRESSION: Tricompartmental DJD without acute findings.

## 2021-03-12 NOTE — Patient Instructions (Signed)
Knee injection today Keep hands in peripheral vision Epidural L4/L5 [id number] See me in 6-8 eeks

## 2021-03-12 NOTE — Assessment & Plan Note (Signed)
Chronic problem with exacerbation.  Attempted again osteopathic manipulation.  Patient does have Celebrex for breakthrough pain.  Discussed with patient though with the MRI findings I do feel at L4-L5 epidural could be beneficial.  Patient will be scheduled for this.  Discussed icing regimen and home exercises.  We will continue to keep with the core stability.  Follow-up again in 4 to 8 weeks.

## 2021-03-17 ENCOUNTER — Ambulatory Visit
Admission: RE | Admit: 2021-03-17 | Discharge: 2021-03-17 | Disposition: A | Discharge status: Home / Self Care | Payer: BC Managed Care – PPO | Source: Ambulatory Visit | Attending: Family Medicine | Admitting: Family Medicine

## 2021-03-17 ENCOUNTER — Other Ambulatory Visit: Payer: Self-pay

## 2021-03-17 DIAGNOSIS — M4317 Spondylolisthesis, lumbosacral region: Secondary | ICD-10-CM

## 2021-03-17 IMAGING — XA Imaging study
2 series · 2 of 2 positions shown · non-contrast
Comparison: none

CLINICAL DATA: Lumbosacral spondylosis without myelopathy. Patient
has left greater than right lower back pain. MR imaging demonstrates
degenerative disc disease at L3-L4 and L4-L5. There is a significant
paucity of epidural fat at L4-L5 precluding epidural steroid
injection. Therefore, steroid injection will be targeted at L3-L4.

[Series 1: ortho standard · 1 of 1 slices shown (1 of 2)]
[im 1/1]
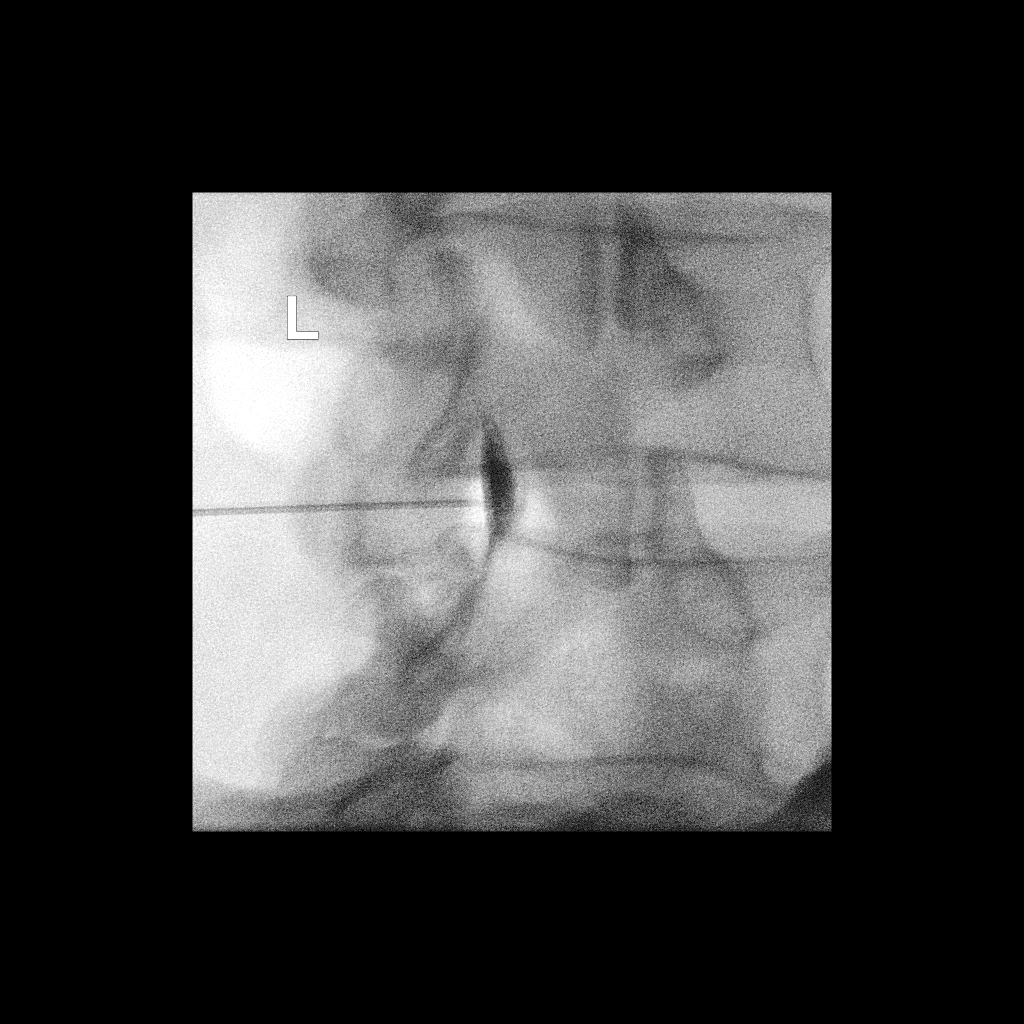

[Series 2: ortho standard · 1 of 1 slices shown (2 of 2)]
[im 1/1]
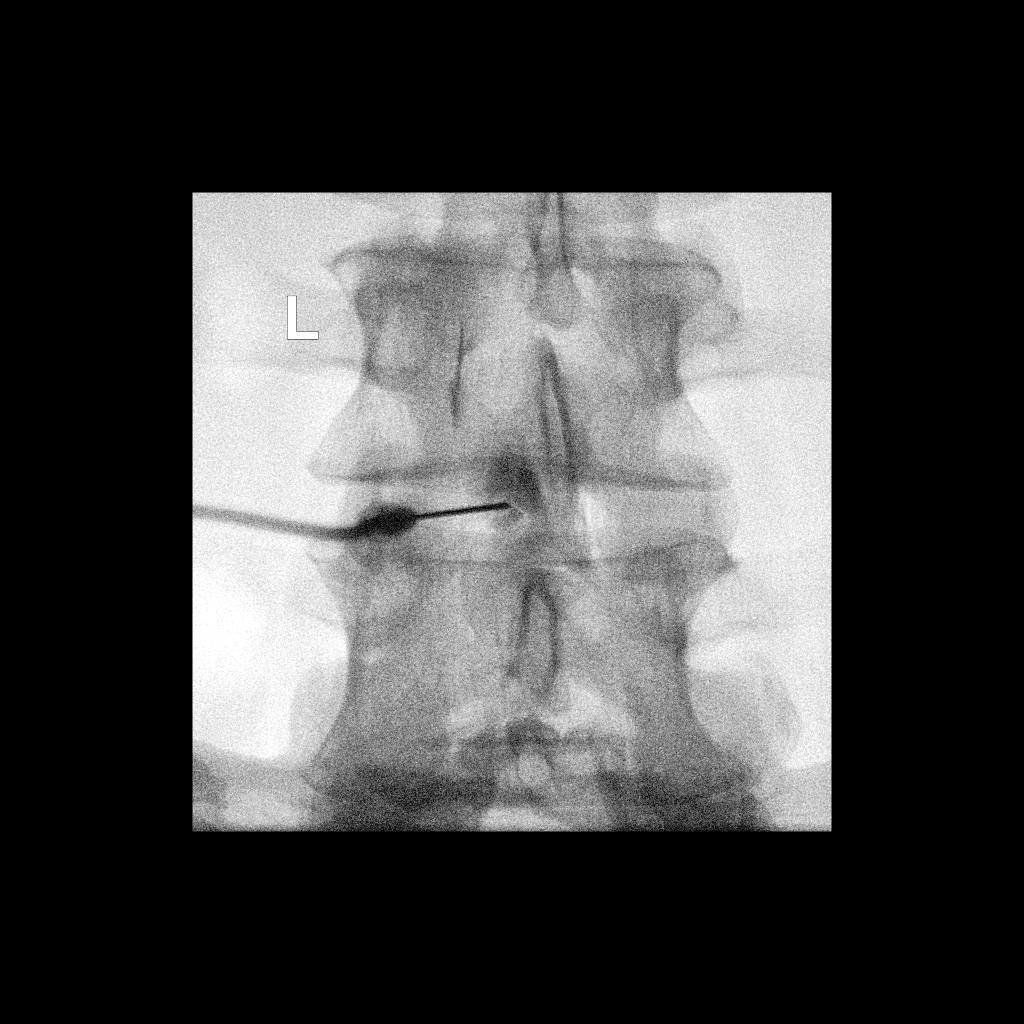

[2 of 2 positions shown; findings below may reference images not displayed]

FLUOROSCOPY TIME:  0 minutes 21 seconds 1.6 mGy

PROCEDURE:
The procedure, risks, benefits, and alternatives were explained to
the patient. Questions regarding the procedure were encouraged and
answered. The patient understands and consents to the procedure.

LUMBAR EPIDURAL INJECTION:

An interlaminar approach was performed on left at L3-L4. The
overlying skin was cleansed and anesthetized. A 20 gauge epidural
needle was advanced using loss-of-resistance technique.

DIAGNOSTIC EPIDURAL INJECTION:

Injection of Isovue-M 200 shows a good epidural pattern with spread
above and below the level of needle placement, primarily on the left
no vascular opacification is seen.

THERAPEUTIC EPIDURAL INJECTION:

40 mg of Depo-Medrol mixed with 2 mL 1% lidocaine were instilled.
The procedure was well-tolerated, and the patient was discharged
thirty minutes following the injection in good condition.

COMPLICATIONS:
None.
IMPRESSION: Technically successful epidural injection on the left L3-L4 #1.

Epidural steroid injection at L4-L5 precluded by a paucity of
epidural fat at this level.

## 2021-03-17 MED ORDER — METHYLPREDNISOLONE ACETATE 40 MG/ML INJ SUSP (RADIOLOG
80.0000 mg | Freq: Once | INTRAMUSCULAR | Status: AC
  Administered 2021-03-17: 09:00:00 80 mg via EPIDURAL

## 2021-03-17 MED ORDER — IOPAMIDOL (ISOVUE-M 300) INJECTION 61%
1.0000 mL | Freq: Once | INTRAMUSCULAR | Status: AC
  Administered 2021-03-17: 09:00:00 1 mL via EPIDURAL

## 2021-03-17 NOTE — Discharge Instructions (Signed)

## 2021-04-06 ENCOUNTER — Other Ambulatory Visit: Payer: Self-pay

## 2021-04-06 ENCOUNTER — Ambulatory Visit: Payer: BC Managed Care – PPO | Admitting: Family Medicine

## 2021-04-06 ENCOUNTER — Encounter: Payer: Self-pay | Admitting: Family Medicine

## 2021-04-06 VITALS — BP 136/90 | HR 71 | Temp 97.5°F | Ht 71.0 in | Wt 209.8 lb

## 2021-04-06 DIAGNOSIS — E291 Testicular hypofunction: Secondary | ICD-10-CM | POA: Diagnosis not present

## 2021-04-06 DIAGNOSIS — F319 Bipolar disorder, unspecified: Secondary | ICD-10-CM

## 2021-04-06 DIAGNOSIS — F902 Attention-deficit hyperactivity disorder, combined type: Secondary | ICD-10-CM

## 2021-04-06 MED ORDER — LISDEXAMFETAMINE DIMESYLATE 60 MG PO CAPS: 60.0000 mg | ORAL_CAPSULE | ORAL | 0 refills | Status: DC

## 2021-04-06 NOTE — Progress Notes (Signed)
Subjective  CC:  Chief Complaint  Patient presents with   ADD    Its been well.     HPI: Brett [name] is a 46 y.o. male who presents to the office today to address the problems listed above in the chief complaint.  Patient is here today for follow up of ADD/ADHD. He is taking medication as directed and continues to feel it is beneficial.  Bipolar disorder: He did try the Zimmerman however it became difficult because of side effects.  He reports it took him to a dark depressed state.  He took it for 4 weeks.  After stopping his mood returned to his baseline.  He continues to exercise to manage his bipolar symptoms. Low testosterone: On testosterone replacement we recently decreased dose due to elevated testosterone levels.  He took his most recent shot 4 days ago.  Feels well.  Assessment  1. Attention deficit hyperactivity disorder (ADHD), combined type   2. Bipolar 1 disorder ([place])   3. Hypogonadism in male      Plan  ADD: Well-controlled on Vyvanse.  Refill Vyvanse 60 mg daily for 3 months. Bipolar disorder: Manages behaviorally. Hypotestosteronism: He will return for fasting testosterone level 1 week prior to his next injection.  Can adjust dose from there.  Recheck CBC at that time as well.  Patient feels stable  Follow up: No follow-ups on file.  Orders Placed This Encounter  Procedures   Testosterone,Free and Total   CBC with Differential/Platelet   Meds ordered this encounter  Medications   lisdexamfetamine (VYVANSE) 60 MG capsule    Sig: Take 1 capsule (60 mg total) by mouth every morning.    Dispense:  30 capsule    Refill:  0   lisdexamfetamine (VYVANSE) 60 MG capsule    Sig: Take 1 capsule (60 mg total) by mouth every morning.    Dispense:  30 capsule    Refill:  0   lisdexamfetamine (VYVANSE) 60 MG capsule    Sig: Take 1 capsule (60 mg total) by mouth every morning.    Dispense:  30 capsule    Refill:  0      I reviewed the patients updated PMH,  FH, and SocHx.    Patient Active Problem List   Diagnosis Date Noted   Attention deficit hyperactivity disorder (ADHD), combined type 09/27/2017    Priority: High   Bipolar 1 disorder ([place]) 06/15/2016    Priority: High   Peripheral polyneuropathy 06/15/2016    Priority: High   Insomnia 06/15/2016    Priority: High   Unilateral primary osteoarthritis, left knee 07/10/2017    Priority: Medium    Chronic midline low back pain without sciatica 07/10/2017    Priority: Medium    Hypogonadism in male 05/20/2017    Priority: Medium    Moderate persistent asthma     Priority: Medium    Vitamin B12 deficiency 05/17/2019    Priority: Low   Spondylolisthesis, lumbosacral region 07/10/2017    Priority: Low   Left rotator cuff tear arthropathy, s/p PT and repair 06/15/2016    Priority: Low   Chronic seasonal allergic rhinitis due to pollen 06/15/2016    Priority: Low   Somatic dysfunction of spine, lumbar 12/04/2020   History of diabetes mellitus 05/17/2019   Current Meds  Medication Sig   albuterol (VENTOLIN HFA) 108 (90 Base) MCG/ACT inhaler INHALE 2 PUFFS BY MOUTH EVERY 6 HOURS AS NEEDED FOR WHEEZE OR SHORTNESS OF BREATH   buPROPion [facility]  XL) 300 MG 24 hr tablet Take 1 tablet (300 mg total) by mouth daily.   celecoxib (CELEBREX) 100 MG capsule TAKE 1 CAPSULE BY MOUTH TWICE A DAY AS NEEDED   Insulin Pen Needle 32G X 4 MM MISC Use 1 needle per day as directed   MAGNESIUM CITRATE PO Take 1 tablet by mouth every evening.   methocarbamol (ROBAXIN) 750 MG tablet TAKE 1 TABLET (750 MG TOTAL) BY MOUTH 2 (TWO) TIMES DAILY AS NEEDED FOR MUSCLE SPASMS.   pregabalin (LYRICA) 200 MG capsule TAKE 1 CAPSULE BY MOUTH TWICE A DAY   testosterone cypionate (DEPOTESTOSTERONE CYPIONATE) 200 MG/ML injection Alternate 100mg  SQ and 200mg  SQ every 14 days. (300mg  total dose / month)   traZODone (DESYREL) 50 MG tablet Take 1 tablet (50 mg total) by mouth at bedtime as needed for sleep.   [DISCONTINUED]  lisdexamfetamine (VYVANSE) 60 MG capsule Take 1 capsule (60 mg total) by mouth every morning.   [DISCONTINUED] lisdexamfetamine (VYVANSE) 60 MG capsule Take 1 capsule (60 mg total) by mouth every morning.   [DISCONTINUED] lisdexamfetamine (VYVANSE) 60 MG capsule Take 1 capsule (60 mg total) by mouth every morning.    Allergies: Patient has No Known Allergies. Family History: Patient family history is not on file. Social History:  Patient  reports that he has never smoked. He has never used smokeless tobacco. He reports that he does not drink alcohol and does not use drugs.  Review of Systems: Constitutional: Negative for fever malaise or anorexia Cardiovascular: negative for chest pain Respiratory: negative for SOB or persistent cough Gastrointestinal: negative for abdominal pain  Objective  Vitals: BP 136/90    Pulse 71    Temp (!) 97.5 F (36.4 C) (Temporal)    Ht 5\' 11"  (1.803 m)    Wt 209 lb 12.8 oz (95.2 kg)    SpO2 93%    BMI 29.26 kg/m  General: no acute distress , A&Ox3   Commons side effects, risks, benefits, and alternatives for medications and treatment plan prescribed today were discussed, and the patient expressed understanding of the given instructions. Patient is instructed to call or message via MyChart if he/she has any questions or concerns regarding our treatment plan. No barriers to understanding were identified. We discussed Red Flag symptoms and signs in detail. Patient expressed understanding regarding what to do in case of urgent or emergency type symptoms.  Medication list was reconciled, printed and provided to the patient in AVS. Patient instructions and summary information was reviewed with the patient as documented in the AVS. This note was prepared with assistance of Dragon voice recognition software. Occasional wrong-word or sound-a-like substitutions may have occurred due to the inherent limitations of voice recognition software

## 2021-04-06 NOTE — Patient Instructions (Signed)
Please schedule a lab visit for the week prior to your next T injection.  Please return in 3 months for your complete physical with blood work. Come fasting.  Then we can space your visits out to every 6 months if all remains stable.   I have refilled ADD meds for 3 months.   If you have any questions or concerns, please don't hesitate to send me a message via MyChart or call the office at [phone]. Thank you for visiting with [place] today! It's our pleasure caring for you.

## 2021-04-15 ENCOUNTER — Other Ambulatory Visit (INDEPENDENT_AMBULATORY_CARE_PROVIDER_SITE_OTHER): Payer: BC Managed Care – PPO

## 2021-04-15 ENCOUNTER — Other Ambulatory Visit: Payer: Self-pay

## 2021-04-15 DIAGNOSIS — E291 Testicular hypofunction: Secondary | ICD-10-CM | POA: Diagnosis not present

## 2021-04-15 LAB — CBC WITH DIFFERENTIAL/PLATELET
Basophils Absolute: 0 10*3/uL (ref 0.0–0.1)
Basophils Relative: 0.5 % (ref 0.0–3.0)
Eosinophils Absolute: 0.1 10*3/uL (ref 0.0–0.7)
Eosinophils Relative: 1.8 % (ref 0.0–5.0)
HCT: 44.9 % (ref 39.0–52.0)
Hemoglobin: 14.8 g/dL (ref 13.0–17.0)
Lymphocytes Relative: 34.1 % (ref 12.0–46.0)
Lymphs Abs: 1.6 10*3/uL (ref 0.7–4.0)
MCHC: 32.9 g/dL (ref 30.0–36.0)
MCV: 90.5 fl (ref 78.0–100.0)
Monocytes Absolute: 0.5 10*3/uL (ref 0.1–1.0)
Monocytes Relative: 10.7 % (ref 3.0–12.0)
Neutro Abs: 2.4 10*3/uL (ref 1.4–7.7)
Neutrophils Relative %: 52.9 % (ref 43.0–77.0)
Platelets: 141 10*3/uL — ABNORMAL LOW (ref 150.0–400.0)
RBC: 4.96 Mil/uL (ref 4.22–5.81)
RDW: 14.7 % (ref 11.5–15.5)
WBC: 4.6 10*3/uL (ref 4.0–10.5)

## 2021-04-16 LAB — TESTOSTERONE: Testosterone: 397

## 2021-04-20 ENCOUNTER — Encounter: Payer: Self-pay | Admitting: Family Medicine

## 2021-04-20 DIAGNOSIS — E291 Testicular hypofunction: Secondary | ICD-10-CM

## 2021-04-20 LAB — TESTOSTERONE,FREE AND TOTAL
Testosterone, Free: 8.3 pg/mL (ref 6.8–21.5)
Testosterone: 397 ng/dL (ref 264–916)

## 2021-04-20 LAB — TESTOSTERONE: Testosterone: 397

## 2021-04-22 ENCOUNTER — Encounter: Payer: Self-pay | Admitting: Family Medicine

## 2021-04-22 NOTE — Progress Notes (Signed)
[name] Phone: [phone] Subjective:   [name], am serving as a scribe for Dr. Hulan Saas.This visit occurred during the SARS-CoV-2 public health emergency.  Safety protocols were in place, including screening questions prior to the visit, additional usage of staff PPE, and extensive cleaning of exam room while observing appropriate contact time as indicated for disinfecting solutions.  I'm seeing this patient by the request  of:  Brett Arnt, MD  CC: Knee pain, back pain and neck.  VHQ:[id number]  [name] is a 46 y.o. male coming in with complaint of back and neck pain. OMT 03/12/2021. Also f/u for L knee injection. Epidural 03/17/2021. Patient states that he did get some relief from epidural. Wonders if he needs another one.   Would like to get sports massage for ITB as he feels that it is tight. No relief from knee injection.   Also would like to know if he has ringworm near L eye.   Medications patient has been prescribed: None          Reviewed prior external information including notes and imaging from previsou exam, outside providers and external EMR if available.   As well as notes that were available from care everywhere and other healthcare systems.  Past medical history, social, surgical and family history all reviewed in electronic medical record.  No pertanent information unless stated regarding to the chief complaint.   Past Medical History:  Diagnosis Date   Asthma    Bipolar 1 disorder ([place]) 06/15/2016   Previously followed by Psychiatry. Hx of "anger issues." Hx of medication use has included Seroquel and Ativan, both of which have helped him in the past.    Chronic pain of both knees 06/15/2016   Chronic seasonal allergic rhinitis due to pollen 06/15/2016   Depression    Gastroesophageal reflux disease without esophagitis 06/15/2016   History of diabetes  mellitus 05/17/2019   Resolved after > 100 pound weight loss and lifestyle changes. 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 No ongoing issues.   Morbid obesity ([place]) 06/15/2016    No Known Allergies   Review of Systems:  No headache, visual changes, nausea, vomiting, diarrhea, constipation, dizziness, abdominal pain, skin rash, fevers, chills, night sweats, weight loss, swollen lymph nodes, body aches, joint swelling, chest pain, shortness of breath, mood changes. POSITIVE muscle aches  Objective  Blood pressure 114/78, pulse 61, height 5\' 11"  (1.803 m), weight 210 lb (95.3 kg), SpO2 99 %.   General: No apparent distress alert and oriented x3 mood and affect normal, dressed appropriately.  HEENT: Pupils equal, extraocular movements intact  Respiratory: Patient's speak in full sentences and does not appear short of breath  Cardiovascular: No lower extremity edema, non tender, no erythema  Low back exam does have loss of lordosis.  Some tightness with straight leg test left greater than right.  Patient's left knee does have instability noted.  Positive crepitus noted.  No significant swelling no noted.  Osteopathic findings  C2 flexed rotated and side bent right C7 flexed rotated and side bent left T3 extended rotated and side bent right inhaled rib T9 extended rotated and side bent left L2 flexed rotated and side bent right Sacrum right on right       Assessment and Plan:  Unilateral primary osteoarthritis, left knee Patient did not make significant improvement at this time.  Patient could be a candidate for possible viscosupplementation or PRP.  Given handouts to read.  Also discussed the possibility of advanced imaging with patient having some mild instability.  Do not think imaging would change our management at this time.  Patient will increase activity  slowly and follow-up with me again in 6 weeks  Chronic midline low back pain without sciatica Patient responded well to the epidural.  Did have to do the L3-L4 difficulty to get him into the L4-L5 area.  We will do another one and see if patient responds any better.  Continue with the physical therapy like exercises.  Patient will follow up again in 4 weeks after the epidural.  Scalp ringworm Diflucan given.  Discussed topical over-the-counter as well.   Nonallopathic problems  Decision today to treat with OMT was based on Physical Exam  After verbal consent patient was treated with HVLA, ME, FPR techniques in cervical, rib, thoracic, lumbar, and sacral  areas  Patient tolerated the procedure well with improvement in symptoms  Patient given exercises, stretches and lifestyle modifications  See medications in patient instructions if given  Patient will follow up in 4-8 weeks      The above documentation has been reviewed and is accurate and complete [name], DO        Note: This dictation was prepared with Dragon dictation along with smaller phrase technology. Any transcriptional errors that result from this process are unintentional.

## 2021-04-23 ENCOUNTER — Other Ambulatory Visit: Payer: Self-pay

## 2021-04-23 ENCOUNTER — Ambulatory Visit: Payer: BC Managed Care – PPO | Admitting: Family Medicine

## 2021-04-23 VITALS — BP 114/78 | HR 61 | Ht 71.0 in | Wt 210.0 lb

## 2021-04-23 DIAGNOSIS — M5416 Radiculopathy, lumbar region: Secondary | ICD-10-CM | POA: Diagnosis not present

## 2021-04-23 DIAGNOSIS — B350 Tinea barbae and tinea capitis: Secondary | ICD-10-CM | POA: Insufficient documentation

## 2021-04-23 DIAGNOSIS — M9903 Segmental and somatic dysfunction of lumbar region: Secondary | ICD-10-CM

## 2021-04-23 DIAGNOSIS — M9904 Segmental and somatic dysfunction of sacral region: Secondary | ICD-10-CM

## 2021-04-23 DIAGNOSIS — M545 Low back pain, unspecified: Secondary | ICD-10-CM

## 2021-04-23 DIAGNOSIS — M1712 Unilateral primary osteoarthritis, left knee: Secondary | ICD-10-CM

## 2021-04-23 DIAGNOSIS — M25562 Pain in left knee: Secondary | ICD-10-CM | POA: Diagnosis not present

## 2021-04-23 DIAGNOSIS — M9902 Segmental and somatic dysfunction of thoracic region: Secondary | ICD-10-CM

## 2021-04-23 DIAGNOSIS — M9908 Segmental and somatic dysfunction of rib cage: Secondary | ICD-10-CM | POA: Diagnosis not present

## 2021-04-23 DIAGNOSIS — M9901 Segmental and somatic dysfunction of cervical region: Secondary | ICD-10-CM

## 2021-04-23 DIAGNOSIS — G8929 Other chronic pain: Secondary | ICD-10-CM

## 2021-04-23 MED ORDER — FLUCONAZOLE 150 MG PO TABS: 150.0000 mg | ORAL_TABLET | Freq: Every day | ORAL | 0 refills | Status: DC

## 2021-04-23 NOTE — Assessment & Plan Note (Signed)
Diflucan given.  Discussed topical over-the-counter as well.

## 2021-04-23 NOTE — Assessment & Plan Note (Signed)
Patient did not make significant improvement at this time.  Patient could be a candidate for possible viscosupplementation or PRP.  Given handouts to read.  Also discussed the possibility of advanced imaging with patient having some mild instability.  Do not think imaging would change our management at this time.  Patient will increase activity slowly and follow-up with me again in 6 weeks

## 2021-04-23 NOTE — Patient Instructions (Signed)
PT [facility] for knee Will get gel approved Epidural [phone] See me again in 3 weeks after epidural

## 2021-04-23 NOTE — Assessment & Plan Note (Signed)
Patient responded well to the epidural.  Did have to do the L3-L4 difficulty to get him into the L4-L5 area.  We will do another one and see if patient responds any better.  Continue with the physical therapy like exercises.  Patient will follow up again in 4 weeks after the epidural.

## 2021-04-27 ENCOUNTER — Ambulatory Visit
Admission: RE | Admit: 2021-04-27 | Discharge: 2021-04-27 | Disposition: A | Discharge status: Home / Self Care | Payer: BC Managed Care – PPO | Source: Ambulatory Visit | Attending: Family Medicine | Admitting: Family Medicine

## 2021-04-27 ENCOUNTER — Other Ambulatory Visit: Payer: Self-pay

## 2021-04-27 DIAGNOSIS — M5416 Radiculopathy, lumbar region: Secondary | ICD-10-CM

## 2021-04-27 IMAGING — XA Imaging study
2 series · 2 of 2 positions shown · non-contrast
Comparison: none

CLINICAL DATA: Improvement following the initial injection. Some
persistent low back discomfort, worse on the left than the right.

[Series 1: ortho adipose · 1 of 1 slices shown (1 of 2)]
[im 1/1]
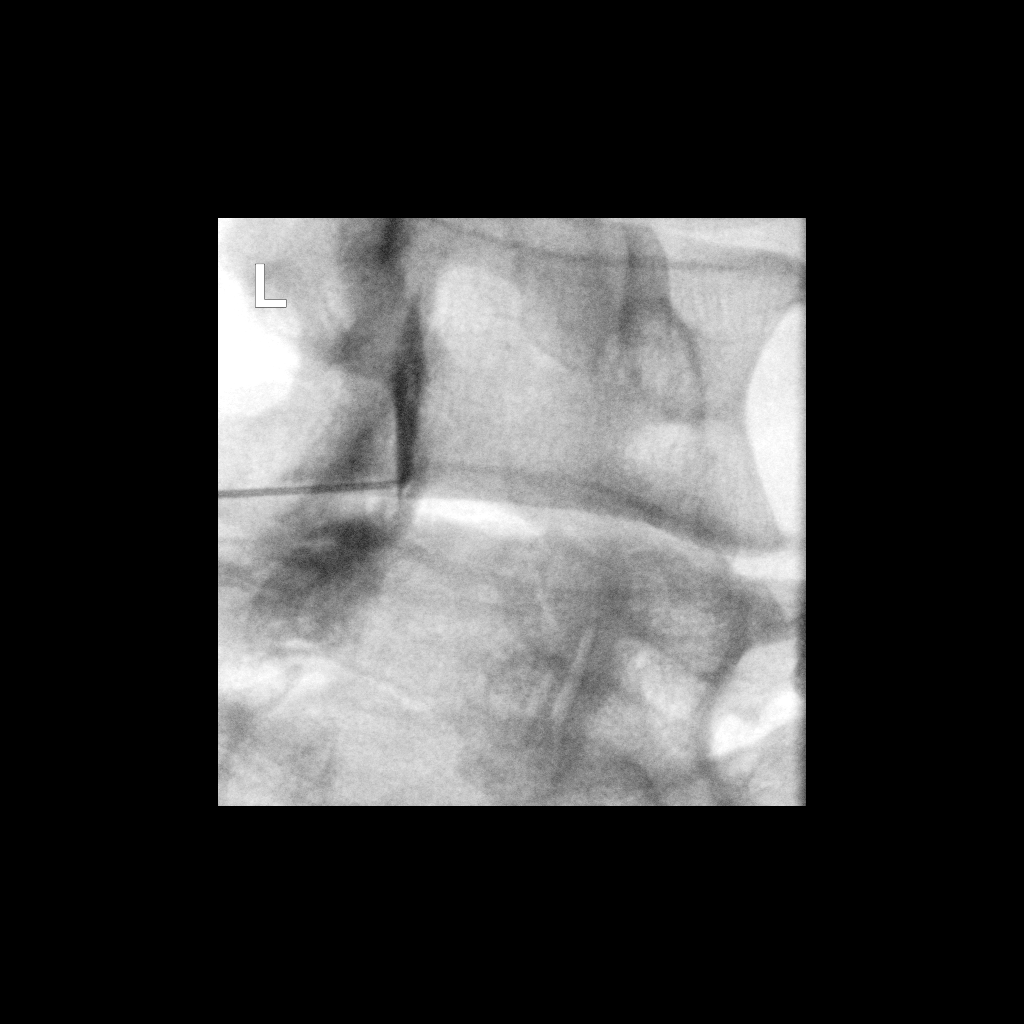

[Series 2: ortho adipose · 1 of 1 slices shown (2 of 2)]
[im 1/1]
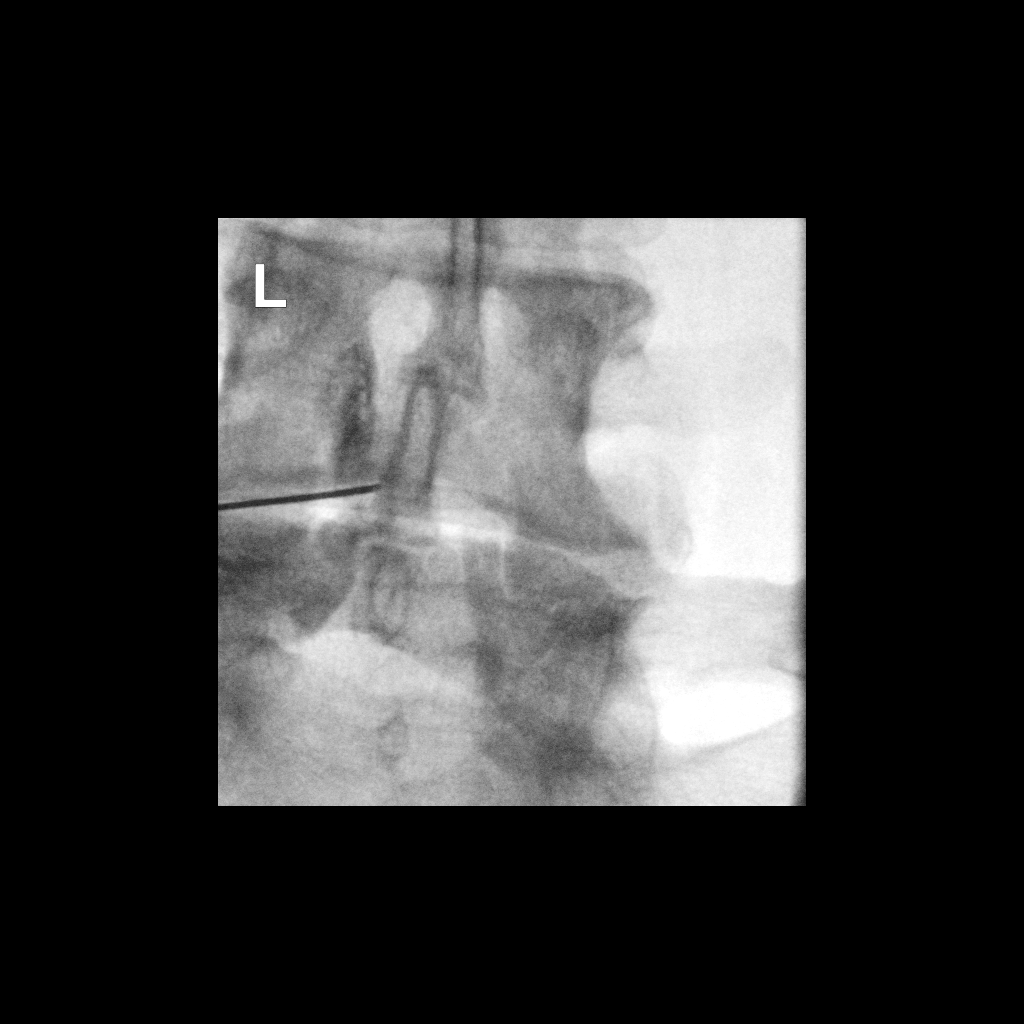

[2 of 2 positions shown; findings below may reference images not displayed]

FLUOROSCOPY TIME:  0 minutes 34 seconds. 40.18 micro gray meter
squared

PROCEDURE:
The procedure, risks, benefits, and alternatives were explained to
the patient. Questions regarding the procedure were encouraged and
answered. The patient understands and consents to the procedure.

LUMBAR EPIDURAL INJECTION:

An interlaminar approach was performed on the left at L4-5. The
overlying skin was cleansed and anesthetized. A 20 gauge epidural
needle was advanced using loss-of-resistance technique.

DIAGNOSTIC EPIDURAL INJECTION:

Injection of Isovue-M 200 shows a good epidural pattern with spread
above and below the level of needle placement, primarily on the
left. No vascular opacification is seen.

THERAPEUTIC EPIDURAL INJECTION:

Eighty mg of Depo-Medrol mixed with 2.5 cc 1% lidocaine were
instilled. The procedure was well-tolerated, and the patient was
discharged thirty minutes following the injection in good condition.

COMPLICATIONS:
None
IMPRESSION: Technically successful epidural injection on the left at L4-5.

In reviewing the MRI, I think it is possible/probable that the
patient has bilateral pars defects at L5.

## 2021-04-27 MED ORDER — IOPAMIDOL (ISOVUE-M 200) INJECTION 41%
1.0000 mL | Freq: Once | INTRAMUSCULAR | Status: AC
  Administered 2021-04-27: 1 mL via EPIDURAL

## 2021-04-27 MED ORDER — METHYLPREDNISOLONE ACETATE 40 MG/ML INJ SUSP (RADIOLOG
80.0000 mg | Freq: Once | INTRAMUSCULAR | Status: AC
  Administered 2021-04-27: 80 mg via EPIDURAL

## 2021-04-27 NOTE — Discharge Instructions (Signed)

## 2021-04-29 ENCOUNTER — Encounter: Payer: Self-pay | Admitting: Family Medicine

## 2021-05-04 ENCOUNTER — Other Ambulatory Visit: Payer: Self-pay | Admitting: Family Medicine

## 2021-05-04 DIAGNOSIS — M2550 Pain in unspecified joint: Secondary | ICD-10-CM

## 2021-05-05 ENCOUNTER — Other Ambulatory Visit: Payer: Self-pay | Admitting: Family Medicine

## 2021-05-05 ENCOUNTER — Other Ambulatory Visit: Payer: Self-pay

## 2021-05-05 DIAGNOSIS — M791 Myalgia, unspecified site: Secondary | ICD-10-CM

## 2021-05-05 MED ORDER — METHOCARBAMOL 750 MG PO TABS: 750.0000 mg | ORAL_TABLET | Freq: Two times a day (BID) | ORAL | 1 refills | Status: DC | PRN

## 2021-05-09 NOTE — Telephone Encounter (Signed)
Please call patient: Needs lab visit 1 week after his testosterone injections. I have ordered the future lab.   (He takes the injections every 2 weeks so can schedule when it is convenient for him as long it is in the am and 1 week after his injection).  I sent him a mychart message so he should be aware.   Thanks!

## 2021-05-11 ENCOUNTER — Other Ambulatory Visit: Payer: Self-pay

## 2021-05-11 ENCOUNTER — Encounter: Payer: Self-pay | Admitting: Physical Therapy

## 2021-05-11 ENCOUNTER — Ambulatory Visit: Payer: BC Managed Care – PPO | Admitting: Physical Therapy

## 2021-05-11 DIAGNOSIS — M545 Low back pain, unspecified: Secondary | ICD-10-CM | POA: Diagnosis not present

## 2021-05-11 DIAGNOSIS — M25561 Pain in right knee: Secondary | ICD-10-CM

## 2021-05-11 DIAGNOSIS — M25552 Pain in left hip: Secondary | ICD-10-CM

## 2021-05-11 DIAGNOSIS — M25562 Pain in left knee: Secondary | ICD-10-CM

## 2021-05-11 DIAGNOSIS — G8929 Other chronic pain: Secondary | ICD-10-CM

## 2021-05-11 MED ORDER — SYNVISC ONE 48 MG/6ML IX SOSY: 6.0000 mL | PREFILLED_SYRINGE | Freq: Once | INTRA_ARTICULAR | 0 refills | Status: AC

## 2021-05-11 NOTE — Therapy (Signed)
OUTPATIENT PHYSICAL THERAPY  EVALUATION   Patient Name: Brett [name] MRN: [id number] DOB:27-Nov-1975, 46 y.o., male Today's Date: 05/11/2021   PT End of Session - 05/11/21 1208     Visit Number 1    Number of Visits 16    Date for PT Re-Evaluation 07/06/21    Authorization Type BCBS    PT Start Time 0805    PT Stop Time 0900    PT Time Calculation (min) 55 min    Activity Tolerance Patient tolerated treatment well    Behavior During Therapy [facility] for tasks assessed/performed             Past Medical History:  Diagnosis Date   Asthma    Bipolar 1 disorder ([place]) 06/15/2016   Previously followed by Psychiatry. Hx of "anger issues." Hx of medication use has included Seroquel and Ativan, both of which have helped him in the past.    Chronic pain of both knees 06/15/2016   Chronic seasonal allergic rhinitis due to pollen 06/15/2016   Depression    Gastroesophageal reflux disease without esophagitis 06/15/2016   History of diabetes mellitus 05/17/2019   Resolved after > 100 pound weight loss and lifestyle changes. 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 No ongoing issues.   Morbid obesity ([place]) 06/15/2016   Past Surgical History:  Procedure Laterality Date   BICEPS TENDON REPAIR     PANNICULECTOMY  11/15/2018   loose skin removal surgery   Patient Active Problem List   Diagnosis Date Noted   Scalp ringworm 04/23/2021   Somatic dysfunction of spine, lumbar 12/04/2020   Vitamin B12 deficiency 05/17/2019   History of diabetes mellitus 05/17/2019   Attention deficit hyperactivity disorder (ADHD), combined type 09/27/2017   Unilateral primary osteoarthritis, left knee 07/10/2017   Spondylolisthesis, lumbosacral region 07/10/2017   Chronic midline low back pain without sciatica 07/10/2017   Hypogonadism in male 05/20/2017   Bipolar 1 disorder ([place])  06/15/2016   Left rotator cuff tear arthropathy, s/p PT and repair 06/15/2016   Chronic seasonal allergic rhinitis due to pollen 06/15/2016   Peripheral polyneuropathy 06/15/2016   Insomnia 06/15/2016   Moderate persistent asthma     PCP: [name], MD  REFERRING PROVIDER: [name], MD  REFERRING DIAG:   THERAPY DIAG:  Chronic bilateral low back pain without sciatica  Pain in left hip  Chronic pain of left knee  Chronic pain of right knee  ONSET DATE:   SUBJECTIVE:  SUBJECTIVE STATEMENT: L knee - recent injection. 10 year history. Increased pain with increased flexion, kneeling, Lateral pressure, first thing in AM- sore. Has lost weight , has helped some. Injection did not help much. Both knees painful.   Back: Long history of pain, has had epidurals, have helped. Increased pain with prolonged standing or sitting.  L hip also painful anteriorly and laterally. Feels tightness in hip with rotation and in ITB, also feels weak in hips.   Works: sitting at desk.  Does jujitsu daily.   Does Some lifting, has been unsure of squats and deadlifts due to pain. Has lost significant amount of weight, over 100 lb.    PERTINENT HISTORY:  none    PAIN:  Are you having pain? Yes NPRS scale: 2/10, up to 8/10 Pain location: L knee  Pain orientation: Left> Right  PAIN TYPE: aching Pain description: intermittent  Aggravating factors:  Kneeling, increased flexion, squat  Relieving factors: none stated   Are you having pain? Yes NPRS scale:   5/10 Pain location: Back Pain orientation: Right and Left  PAIN TYPE: aching Pain description: intermittent  Aggravating factors: Prolonged sitting or standing, fwd bend, lift,  Relieving factors: none stated   PRECAUTIONS: None  WEIGHT BEARING  RESTRICTIONS No  FALLS:  Has patient fallen in last 6 months? 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    OBJECTIVE:   DIAGNOSTIC FINDINGS:  Lumbar X-ray: 1. Lumbar disc and posterior element degeneration L2-L3 through L5-S1, with grade 1 spondylolisthesis up to 9 mm at the latter. Small caudal midline disc extrusion at L3-L4. 2. Capacious underlying spinal canal, no significant lumbar spinal stenosis. Up to mild bilateral lateral recess stenosis at the L4 nerve levels. And moderate bilateral L5 neural foraminal stenosis  Hip X-ray:    Mild bilateral hip osteoarthritis. Knee X-ray:  Tricompartmental DJD without acute findings.   COGNITION: Overall cognitive status: Within functional limits for tasks assessed    PALPATION: Hypomobile L patella, decreased medial glide Mod limitation for bil quad length  Hypomobile L hip, increased stiffness and pain with flexion and IR Pain in L hip, glute med, min, and into ITB,  Soreness in bil low back, SI , into bil glutes    AROM/PROM 05/11/21: Lumbar: WFL, pain with L and R SB  Hips: limited L hip flex, IR, ER and ext, painful flex and IR;  R WFL Knees: WFL in supine, limited in prone due to quad tightness.    MMT:  05/11/21: Hip flex: 4+/5, Abd: 4/5, Ext: 4/5 Knees: 5/5    LUMBAR SPECIAL TESTS:  SLR: neg;  + FADIR on L;      TODAY'S TREATMENT  See below for HEP   PATIENT EDUCATION:  Education details: PT POC, Exam findings, HEP Person educated: Patient Education method: Explanation, Demonstration, Tactile cues, Verbal cues, and Handouts Education comprehension: verbalized understanding, returned demonstration, verbal cues required, tactile cues required, and needs further education   HOME EXERCISE PROGRAM: Access Code: [email or web address] URL:  [email or web address] Date: 05/11/2021 Prepared by: Lyndee Hensen  Exercises Supine Lower Trunk Rotation - 2 x daily - 10 reps - 5 hold Single Knee to Chest Stretch - 2 x daily - 3 reps - 30 hold Prone Quadriceps Stretch with Strap - 2 x daily - 2 sets - 10 reps  Supine Piriformis Stretch with Leg Straight - 2 x daily - 3 reps - 30 hold    ASSESSMENT:  CLINICAL IMPRESSION: Pt presents with primary complaint of increased pain in back and bil knees. He also has pain in L hip. Pt  with joint stiffness in L hip and is quite limited with L hip ROM for flexion and IR due to pain. He also has weakness in hip abd and poor strength of posterior chain- likely due to ongoing back pain and non use. Pt with hypomobile L patella vs R, and tightness in lateral quad and ITB. He has increased pain in low lumbar and SI region bilaterally. Pt active with jujutsu, but has lack of effective HEP for his ongoing pain and deficits. He will benefit from education on ther ex for strengthening for core, hips, and knee, to decrease pain and improve ability for functional activity.   Objective impairments include Abnormal gait, decreased activity tolerance, decreased endurance, decreased mobility, decreased ROM, decreased strength, decreased safety awareness, hypomobility, increased muscle spasms, impaired flexibility, improper body mechanics, and pain. These impairments are limiting patient from cleaning, community activity, driving, yard work, and shopping. Personal factors including Time since onset of injury/illness/exacerbation are also affecting patient's functional outcome. Patient will benefit from skilled PT to address above impairments and improve overall function.  REHAB POTENTIAL: Good  CLINICAL DECISION MAKING: Stable/uncomplicated  EVALUATION COMPLEXITY: Low   GOALS: Goals reviewed with patient? Yes  SHORT TERM GOALS:  STG Name Target Date Goal status  1 Patient will be independent  with initial HEP   05/25/21 INITIAL  2 Pt to demo improved ROM for L hip to be [facility] for flexion and IR  06/01/21 INITIAL         LONG TERM GOALS:   LTG Name Target Date 8 weeks  Goal status  1 Patient will be independent with final HEP  07/06/21 INITIAL  2 Pt to report decreased pain in L hip, back and knees to 0-2/10 with activity   07/06/21 INITIAL  3 Pt to demonstrate improved strength of L hip to 5/5, to improve ability for jujutsu, and hip abduction motion without pain  07/06/21 INITIAL  4 Pt to demonstrate optimal mechanics and ability for bend, lift, and functional squat without pain, to improve back pain and ability for IADLs.   07/06/21 INITIAL  5 Pt to demo ability for kneeling/ready position without pain greater than 2/10 in hip and knee, to improve ability for exercise and jujutsu activities.   07/06/21 INITIAL       PLAN: PT FREQUENCY: 2x/week  PT DURATION: 8 weeks  PLANNED INTERVENTIONS: Therapeutic exercises, Therapeutic activity, Neuro Muscular re-education, Balance training, Gait training, Patient/Family education, Joint mobilization, Stair training, Orthotic/Fit training, DME instructions, Dry Needling, Electrical stimulation, Spinal mobilization, Cryotherapy, Moist heat, Taping, Vasopneumatic device, Traction, Ultrasound, Ionotophoresis 4mg /ml Dexamethasone, and Manual therapy  PLAN FOR NEXT SESSION:    Lyndee Hensen, PT 05/11/2021, 12:09 PM

## 2021-05-12 ENCOUNTER — Encounter: Payer: Self-pay | Admitting: Physical Therapy

## 2021-05-12 ENCOUNTER — Ambulatory Visit: Payer: BC Managed Care – PPO | Admitting: Physical Therapy

## 2021-05-12 ENCOUNTER — Other Ambulatory Visit: Payer: Self-pay | Admitting: Family Medicine

## 2021-05-12 DIAGNOSIS — M545 Low back pain, unspecified: Secondary | ICD-10-CM

## 2021-05-12 DIAGNOSIS — G8929 Other chronic pain: Secondary | ICD-10-CM

## 2021-05-12 DIAGNOSIS — M25552 Pain in left hip: Secondary | ICD-10-CM | POA: Diagnosis not present

## 2021-05-12 DIAGNOSIS — M25562 Pain in left knee: Secondary | ICD-10-CM | POA: Diagnosis not present

## 2021-05-12 DIAGNOSIS — M25561 Pain in right knee: Secondary | ICD-10-CM | POA: Diagnosis not present

## 2021-05-12 NOTE — Telephone Encounter (Signed)
LAST APPOINTMENT DATE: 05/05/2021   NEXT APPOINTMENT DATE: 05/19/2021    LAST REFILL: 05/06/2021  QTY: 30

## 2021-05-12 NOTE — Therapy (Signed)
OUTPATIENT PHYSICAL THERAPY TREATMENT    Patient Name: Brett [name] MRN: [id number] DOB:06-15-1975, 46 y.o., male Today's Date: 05/12/2021   PT End of Session - 05/12/21 1202     Visit Number 2    Number of Visits 16    Date for PT Re-Evaluation 07/06/21    Authorization Type BCBS    PT Start Time 0801    PT Stop Time 0855    PT Time Calculation (min) 54 min    Activity Tolerance Patient tolerated treatment well    Behavior During Therapy [facility] for tasks assessed/performed             Past Medical History:  Diagnosis Date   Asthma    Bipolar 1 disorder ([place]) 06/15/2016   Previously followed by Psychiatry. Hx of "anger issues." Hx of medication use has included Seroquel and Ativan, both of which have helped him in the past.    Chronic pain of both knees 06/15/2016   Chronic seasonal allergic rhinitis due to pollen 06/15/2016   Depression    Gastroesophageal reflux disease without esophagitis 06/15/2016   History of diabetes mellitus 05/17/2019   Resolved after > 100 pound weight loss and lifestyle changes. 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 No ongoing issues.   Morbid obesity ([place]) 06/15/2016   Past Surgical History:  Procedure Laterality Date   BICEPS TENDON REPAIR     PANNICULECTOMY  11/15/2018   loose skin removal surgery   Patient Active Problem List   Diagnosis Date Noted   Scalp ringworm 04/23/2021   Somatic dysfunction of spine, lumbar 12/04/2020   Vitamin B12 deficiency 05/17/2019   History of diabetes mellitus 05/17/2019   Attention deficit hyperactivity disorder (ADHD), combined type 09/27/2017   Unilateral primary osteoarthritis, left knee 07/10/2017   Spondylolisthesis, lumbosacral region 07/10/2017   Chronic midline low back pain without sciatica 07/10/2017   Hypogonadism in male 05/20/2017   Bipolar 1 disorder ([place])  06/15/2016   Left rotator cuff tear arthropathy, s/p PT and repair 06/15/2016   Chronic seasonal allergic rhinitis due to pollen 06/15/2016   Peripheral polyneuropathy 06/15/2016   Insomnia 06/15/2016   Moderate persistent asthma     PCP: [name], MD  REFERRING PROVIDER: [name], MD  REFERRING DIAG:   THERAPY DIAG:  Chronic bilateral low back pain without sciatica  Pain in left hip  Chronic pain of left knee  Chronic pain of right knee  ONSET DATE:   SUBJECTIVE:  SUBJECTIVE STATEMENT: Pt states some relief of hip tightness with treatment yesterday    PERTINENT HISTORY:  none    PAIN:  Are you having pain? Yes NPRS scale: 2/10, up to 8/10 Pain location: L knee  Pain orientation: Left> Right  PAIN TYPE: aching Pain description: intermittent  Aggravating factors:  Kneeling, increased flexion, squat  Relieving factors: none stated   Are you having pain? Yes NPRS scale:   5/10 Pain location: Back Pain orientation: Right and Left  PAIN TYPE: aching Pain description: intermittent  Aggravating factors: Prolonged sitting or standing, fwd bend, lift,  Relieving factors: none stated   PRECAUTIONS: None  WEIGHT BEARING RESTRICTIONS No  FALLS:  Has patient fallen in last 6 months? 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    OBJECTIVE:   DIAGNOSTIC FINDINGS:  Lumbar X-ray: 1. Lumbar disc and posterior element degeneration L2-L3 through L5-S1, with grade 1 spondylolisthesis up to 9 mm at the latter. Small caudal midline disc extrusion at L3-L4. 2. Capacious underlying spinal canal, no  significant lumbar spinal stenosis. Up to mild bilateral lateral recess stenosis at the L4 nerve levels. And moderate bilateral L5 neural foraminal stenosis  Hip X-ray:    Mild bilateral hip osteoarthritis. Knee X-ray:  Tricompartmental DJD without acute findings.   COGNITION: Overall cognitive status: Within functional limits for tasks assessed    PALPATION: Hypomobile L patella, decreased medial glide Mod limitation for bil quad length  Hypomobile L hip, increased stiffness and pain with flexion and IR Pain in L hip, glute med, min, and into ITB,  Soreness in bil low back, SI , into bil glutes    AROM/PROM 05/11/21: Lumbar: WFL, pain with L and R SB  Hips: limited L hip flex, IR, ER and ext, painful flex and IR;  R WFL Knees: WFL in supine, limited in prone due to quad tightness.   MMT:  05/11/21: Hip flex: 4+/5, Abd: 4/5, Ext: 4/5 Knees: 5/5   LUMBAR SPECIAL TESTS:  SLR: neg;  + FADIR on L;     TODAY'S TREATMENT   05/12/21: Therapeutic Exercise:  Aerobic:   Recumbent bike: L6 x 6 min ; Supine:  Bridging x 10, SL bridge x 10 bil;  S/L:   Hip abd x15 bil; Clams x 15 bil;  Seated: Standing:  Hip abd RTB x 10 bil;  Stretches:  STKC 30 sec 2 on R; Hip IR across body 30 sec x 2;   Neuromuscular Re-education: Manual Therapy: DTM/IASTM to L ITB and TFL;  Hip post mobs, Inf and lat mobs with strap;  Long leg distraction for hip ;     PATIENT EDUCATION:  Education details: updated and reviewed HEP Person educated: Patient Education method: Explanation, Demonstration, Tactile cues, Verbal cues, and Handouts Education comprehension: verbalized understanding, returned demonstration, verbal cues required, tactile cues required, and needs further education   HOME EXERCISE PROGRAM: Access Code: [email or web address]    ASSESSMENT:  CLINICAL IMPRESSION: 05/12/21: Pt with increased pain in anterior L hip- Focus for mobilization and initial strengthening for hips today. Pt  challenged with hip strengthening, will benefit from progression of hip abd, rotation and glute strengthening.   Objective impairments include Abnormal gait, decreased activity tolerance, decreased endurance, decreased mobility, decreased ROM, decreased strength, decreased safety awareness, hypomobility, increased muscle spasms, impaired flexibility,  improper body mechanics, and pain. These impairments are limiting patient from cleaning, community activity, driving, yard work, and shopping. Personal factors including Time since onset of injury/illness/exacerbation are also affecting patient's functional outcome. Patient will benefit from skilled PT to address above impairments and improve overall function.  REHAB POTENTIAL: Good  CLINICAL DECISION MAKING: Stable/uncomplicated  EVALUATION COMPLEXITY: Low   GOALS: Goals reviewed with patient? Yes  SHORT TERM GOALS:  STG Name Target Date Goal status  1 Patient will be independent with initial HEP   05/25/21 INITIAL  2 Pt to demo improved ROM for L hip to be [facility] for flexion and IR  06/01/21 INITIAL         LONG TERM GOALS:   LTG Name Target Date 8 weeks  Goal status  1 Patient will be independent with final HEP  07/06/21 INITIAL  2 Pt to report decreased pain in L hip, back and knees to 0-2/10 with activity   07/06/21 INITIAL  3 Pt to demonstrate improved strength of L hip to 5/5, to improve ability for jujutsu, and hip abduction motion without pain  07/06/21 INITIAL  4 Pt to demonstrate optimal mechanics and ability for bend, lift, and functional squat without pain, to improve back pain and ability for IADLs.   07/06/21 INITIAL  5 Pt to demo ability for kneeling/ready position without pain greater than 2/10 in hip and knee, to improve ability for exercise and jujutsu activities.   07/06/21 INITIAL       PLAN: PT FREQUENCY: 2x/week  PT DURATION: 8 weeks  PLANNED INTERVENTIONS: Therapeutic exercises, Therapeutic activity, Neuro  Muscular re-education, Balance training, Gait training, Patient/Family education, Joint mobilization, Stair training, Orthotic/Fit training, DME instructions, Dry Needling, Electrical stimulation, Spinal mobilization, Cryotherapy, Moist heat, Taping, Vasopneumatic device, Traction, Ultrasound, Ionotophoresis 4mg /ml Dexamethasone, and Manual therapy  PLAN FOR NEXT SESSION:   Lyndee Hensen, PT, DPT 12:09 PM  05/12/21

## 2021-05-12 NOTE — Telephone Encounter (Signed)
Called pt verified DOB, appt made for the 24th after seeing Lauren in PT. Pt aware of appt time and future orders were already placed.

## 2021-05-13 NOTE — Telephone Encounter (Signed)
Rx was send. Unable to refused controled medication

## 2021-05-19 ENCOUNTER — Ambulatory Visit: Payer: BC Managed Care – PPO | Admitting: Physical Therapy

## 2021-05-19 ENCOUNTER — Encounter: Payer: Self-pay | Admitting: Physical Therapy

## 2021-05-19 ENCOUNTER — Other Ambulatory Visit: Payer: Self-pay

## 2021-05-19 DIAGNOSIS — G8929 Other chronic pain: Secondary | ICD-10-CM

## 2021-05-19 DIAGNOSIS — M25552 Pain in left hip: Secondary | ICD-10-CM | POA: Diagnosis not present

## 2021-05-19 DIAGNOSIS — M25562 Pain in left knee: Secondary | ICD-10-CM | POA: Diagnosis not present

## 2021-05-19 DIAGNOSIS — M545 Low back pain, unspecified: Secondary | ICD-10-CM | POA: Diagnosis not present

## 2021-05-19 NOTE — Therapy (Signed)
OUTPATIENT PHYSICAL THERAPY TREATMENT    Patient Name: Brett [name] MRN: [id number] DOB:[birth year], 46 y.o., male Today's Date: 05/19/2021   PT End of Session - 05/19/21 2100     Visit Number 3    Number of Visits 16    Date for PT Re-Evaluation 07/06/21    Authorization Type BCBS    PT Start Time 1100    PT Stop Time 1148    PT Time Calculation (min) 48 min    Activity Tolerance Patient tolerated treatment well    Behavior During Therapy [facility] for tasks assessed/performed              Past Medical History:  Diagnosis Date   Asthma    Bipolar 1 disorder ([place]) 06/15/2016   Previously followed by Psychiatry. Hx of "anger issues." Hx of medication use has included Seroquel and Ativan, both of which have helped him in the past.    Chronic pain of both knees 06/15/2016   Chronic seasonal allergic rhinitis due to pollen 06/15/2016   Depression    Gastroesophageal reflux disease without esophagitis 06/15/2016   History of diabetes mellitus 05/17/2019   Resolved after > 100 pound weight loss and lifestyle changes. 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 No ongoing issues.   Morbid obesity ([place]) 06/15/2016   Past Surgical History:  Procedure Laterality Date   BICEPS TENDON REPAIR     PANNICULECTOMY  11/15/2018   loose skin removal surgery   Patient Active Problem List   Diagnosis Date Noted   Scalp ringworm 04/23/2021   Somatic dysfunction of spine, lumbar 12/04/2020   Vitamin B12 deficiency 05/17/2019   History of diabetes mellitus 05/17/2019   Attention deficit hyperactivity disorder (ADHD), combined type 09/27/2017   Unilateral primary osteoarthritis, left knee 07/10/2017   Spondylolisthesis, lumbosacral region 07/10/2017   Chronic midline low back pain without sciatica 07/10/2017   Hypogonadism in male 05/20/2017   Bipolar 1 disorder ([place])  06/15/2016   Left rotator cuff tear arthropathy, s/p PT and repair 06/15/2016   Chronic seasonal allergic rhinitis due to pollen 06/15/2016   Peripheral polyneuropathy 06/15/2016   Insomnia 06/15/2016   Moderate persistent asthma     PCP: [name], MD  REFERRING PROVIDER: [name], MD  REFERRING DIAG:   THERAPY DIAG:  Chronic bilateral low back pain without sciatica  Pain in left hip  Chronic pain of left knee  ONSET DATE:   SUBJECTIVE:  SUBJECTIVE STATEMENT: Pt has been doing HEP, challenged with hip strengthening. Notes tightness and stiffness in back.    PERTINENT HISTORY:  none    PAIN:  Are you having pain? Yes NPRS scale: 2/10, up to 8/10 Pain location: L knee  Pain orientation: Left> Right  PAIN TYPE: aching Pain description: intermittent  Aggravating factors:  Kneeling, increased flexion, squat  Relieving factors: none stated   Are you having pain? Yes NPRS scale:   5/10 Pain location: Back Pain orientation: Right and Left  PAIN TYPE: aching Pain description: intermittent  Aggravating factors: Prolonged sitting or standing, fwd bend, lift,  Relieving factors: none stated   PRECAUTIONS: None  WEIGHT BEARING RESTRICTIONS No  FALLS:  Has patient fallen in last 6 months? 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    OBJECTIVE:   DIAGNOSTIC FINDINGS:  Lumbar X-ray: 1. Lumbar disc and posterior element degeneration L2-L3 through L5-S1, with grade 1 spondylolisthesis up to 9 mm at the latter. Small caudal midline disc extrusion at L3-L4. 2. Capacious underlying spinal canal, no  significant lumbar spinal stenosis. Up to mild bilateral lateral recess stenosis at the L4 nerve levels. And moderate bilateral L5 neural foraminal stenosis  Hip X-ray:    Mild bilateral hip osteoarthritis. Knee X-ray:  Tricompartmental DJD without acute findings.   COGNITION: Overall cognitive status: Within functional limits for tasks assessed    PALPATION: Hypomobile L patella, decreased medial glide Mod limitation for bil quad length  Hypomobile L hip, increased stiffness and pain with flexion and IR Pain in L hip, glute med, min, and into ITB,  Soreness in bil low back, SI , into bil glutes    AROM/PROM 05/11/21: Lumbar: WFL, pain with L and R SB  Hips: limited L hip flex, IR, ER and ext, painful flex and IR;  R WFL Knees: WFL in supine, limited in prone due to quad tightness.   MMT:  05/11/21: Hip flex: 4+/5, Abd: 4/5, Ext: 4/5 Knees: 5/5   LUMBAR SPECIAL TESTS:  SLR: neg;  + FADIR on L;     TODAY'S TREATMENT   05/19/21: Therapeutic Exercise: Aerobic:   Recumbent bike: L5 x 6 min ; Supine:  SL bridge x 20 bil;  S/L:   Hip abd x 20 bil;  Seated:  Standing:   Band walks Blue TB 20 ft x8; Dead lift 25lb x 15; Squat x20;  Stretches:  STKC 30 sec 2 on R; Hip IR across body 30 sec x 2; Quad stretch 30 sec x 3 bil;  Neuromuscular Re-education: Manual Therapy:  Hip post mobs, Long leg distraction for hip ; L knee mobs to inc flexion, PROM for L knee flexion  05/12/21: Therapeutic Exercise:  Aerobic:   Recumbent bike: L6 x 6 min ; Supine:  Bridging x 10, SL bridge x 10 bil;  S/L:   Hip abd x15 bil; Clams x 15 bil;  Seated: Standing:  Hip abd RTB x 10 bil;  Stretches:  STKC 30 sec 2 on R; Hip IR across body 30 sec x 2;   Neuromuscular Re-education: Manual Therapy: DTM/IASTM to L ITB and TFL;  Hip post mobs, Inf and lat mobs with strap;  Long leg distraction for hip ;     PATIENT EDUCATION:  Education details: updated and reviewed HEP Person  educated:  Patient Education method: Explanation, Demonstration, Tactile cues, Verbal cues, and Handouts Education comprehension: verbalized understanding, returned demonstration, verbal cues required, tactile cues required, and needs further education   HOME EXERCISE PROGRAM: Access Code: [email or web address]    ASSESSMENT:  CLINICAL IMPRESSION: 05/19/21: Pt with increased pain in L hip with full flexion and IR. He is challenged with strengthening exercises today. Improved ability for deadlift and squat after education and practice for optimal mechanics. He has difficulty and pain with full/end range knee flexion on L, mild improvements after manual and quad stretching today. Plan to progress as tolerated.   Objective impairments include Abnormal gait, decreased activity tolerance, decreased endurance, decreased mobility, decreased ROM, decreased strength, decreased safety awareness, hypomobility, increased muscle spasms, impaired flexibility, improper body mechanics, and pain. These impairments are limiting patient from cleaning, community activity, driving, yard work, and shopping. Personal factors including Time since onset of injury/illness/exacerbation are also affecting patient's functional outcome. Patient will benefit from skilled PT to address above impairments and improve overall function.  REHAB POTENTIAL: Good  CLINICAL DECISION MAKING: Stable/uncomplicated  EVALUATION COMPLEXITY: Low   GOALS: Goals reviewed with patient? Yes  SHORT TERM GOALS:  STG Name Target Date Goal status  1 Patient will be independent with initial HEP   05/25/21 INITIAL  2 Pt to demo improved ROM for L hip to be [facility] for flexion and IR  06/01/21 INITIAL         LONG TERM GOALS:   LTG Name Target Date 8 weeks  Goal status  1 Patient will be independent with final HEP  07/06/21 INITIAL  2 Pt to report decreased pain in L hip, back and knees to 0-2/10 with activity   07/06/21 INITIAL  3 Pt to demonstrate improved  strength of L hip to 5/5, to improve ability for jujutsu, and hip abduction motion without pain  07/06/21 INITIAL  4 Pt to demonstrate optimal mechanics and ability for bend, lift, and functional squat without pain, to improve back pain and ability for IADLs.   07/06/21 INITIAL  5 Pt to demo ability for kneeling/ready position without pain greater than 2/10 in hip and knee, to improve ability for exercise and jujutsu activities.   07/06/21 INITIAL       PLAN: PT FREQUENCY: 2x/week  PT DURATION: 8 weeks  PLANNED INTERVENTIONS: Therapeutic exercises, Therapeutic activity, Neuro Muscular re-education, Balance training, Gait training, Patient/Family education, Joint mobilization, Stair training, Orthotic/Fit training, DME instructions, Dry Needling, Electrical stimulation, Spinal mobilization, Cryotherapy, Moist heat, Taping, Vasopneumatic device, Traction, Ultrasound, Ionotophoresis 4mg /ml Dexamethasone, and Manual therapy  PLAN FOR NEXT SESSION:   Lyndee Hensen, PT, DPT 9:01 PM  05/19/21

## 2021-05-21 ENCOUNTER — Ambulatory Visit: Payer: BC Managed Care – PPO | Admitting: Physical Therapy

## 2021-05-21 ENCOUNTER — Other Ambulatory Visit: Payer: Self-pay

## 2021-05-21 ENCOUNTER — Encounter: Payer: Self-pay | Admitting: Physical Therapy

## 2021-05-21 ENCOUNTER — Other Ambulatory Visit: Payer: BC Managed Care – PPO

## 2021-05-21 DIAGNOSIS — M25562 Pain in left knee: Secondary | ICD-10-CM | POA: Diagnosis not present

## 2021-05-21 DIAGNOSIS — M25561 Pain in right knee: Secondary | ICD-10-CM | POA: Diagnosis not present

## 2021-05-21 DIAGNOSIS — M25552 Pain in left hip: Secondary | ICD-10-CM

## 2021-05-21 DIAGNOSIS — M545 Low back pain, unspecified: Secondary | ICD-10-CM

## 2021-05-21 DIAGNOSIS — G8929 Other chronic pain: Secondary | ICD-10-CM

## 2021-05-21 NOTE — Therapy (Signed)
OUTPATIENT PHYSICAL THERAPY TREATMENT    Patient Name: Brett [name] MRN: [id number] DOB:13-Oct-1975, 46 y.o., male Today's Date: 05/21/2021   PT End of Session - 05/21/21 1201     Visit Number 4    Number of Visits 16    Date for PT Re-Evaluation 07/06/21    Authorization Type BCBS    PT Start Time 0930    PT Stop Time 1017    PT Time Calculation (min) 47 min    Activity Tolerance Patient tolerated treatment well    Behavior During Therapy [facility] for tasks assessed/performed               Past Medical History:  Diagnosis Date   Asthma    Bipolar 1 disorder ([place]) 06/15/2016   Previously followed by Psychiatry. Hx of "anger issues." Hx of medication use has included Seroquel and Ativan, both of which have helped him in the past.    Chronic pain of both knees 06/15/2016   Chronic seasonal allergic rhinitis due to pollen 06/15/2016   Depression    Gastroesophageal reflux disease without esophagitis 06/15/2016   History of diabetes mellitus 05/17/2019   Resolved after > 100 pound weight loss and lifestyle changes. 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 No ongoing issues.   Morbid obesity ([place]) 06/15/2016   Past Surgical History:  Procedure Laterality Date   BICEPS TENDON REPAIR     PANNICULECTOMY  11/15/2018   loose skin removal surgery   Patient Active Problem List   Diagnosis Date Noted   Scalp ringworm 04/23/2021   Somatic dysfunction of spine, lumbar 12/04/2020   Vitamin B12 deficiency 05/17/2019   History of diabetes mellitus 05/17/2019   Attention deficit hyperactivity disorder (ADHD), combined type 09/27/2017   Unilateral primary osteoarthritis, left knee 07/10/2017   Spondylolisthesis, lumbosacral region 07/10/2017   Chronic midline low back pain without sciatica 07/10/2017   Hypogonadism in male 05/20/2017   Bipolar 1 disorder ([place])  06/15/2016   Left rotator cuff tear arthropathy, s/p PT and repair 06/15/2016   Chronic seasonal allergic rhinitis due to pollen 06/15/2016   Peripheral polyneuropathy 06/15/2016   Insomnia 06/15/2016   Moderate persistent asthma     PCP: [name], MD  REFERRING PROVIDER: [name], MD  REFERRING DIAG:   THERAPY DIAG:  Chronic bilateral low back pain without sciatica  Pain in left hip  Chronic pain of left knee  Chronic pain of right knee  ONSET DATE:   SUBJECTIVE:  SUBJECTIVE STATEMENT: Pt with minimal back pain today, has lots of stiffness when getting up and down, even if sitting for just a few min.    PERTINENT HISTORY:  none    PAIN:  Are you having pain? Yes NPRS scale: 2/10, up to 8/10 Pain location: L knee  Pain orientation: Left> Right  PAIN TYPE: aching Pain description: intermittent  Aggravating factors:  Kneeling, increased flexion, squat  Relieving factors: none stated   Are you having pain? Yes NPRS scale:   5/10 Pain location: Back Pain orientation: Right and Left  PAIN TYPE: aching Pain description: intermittent  Aggravating factors: Prolonged sitting or standing, fwd bend, lift,  Relieving factors: none stated   PRECAUTIONS: None  WEIGHT BEARING RESTRICTIONS No  FALLS:  Has patient fallen in last 6 months? 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    OBJECTIVE:   DIAGNOSTIC FINDINGS:  Lumbar X-ray: 1. Lumbar disc and posterior element degeneration L2-L3 through L5-S1, with grade 1 spondylolisthesis up to 9 mm at the latter. Small caudal midline disc extrusion at  L3-L4. 2. Capacious underlying spinal canal, no significant lumbar spinal stenosis. Up to mild bilateral lateral recess stenosis at the L4 nerve levels. And moderate bilateral L5 neural foraminal stenosis  Hip X-ray:    Mild bilateral hip osteoarthritis. Knee X-ray:  Tricompartmental DJD without acute findings.   COGNITION: Overall cognitive status: Within functional limits for tasks assessed    PALPATION: Hypomobile L patella, decreased medial glide Mod limitation for bil quad length  Hypomobile L hip, increased stiffness and pain with flexion and IR Pain in L hip, glute med, min, and into ITB,  Soreness in bil low back, SI , into bil glutes    AROM/PROM 05/11/21: Lumbar: WFL, pain with L and R SB  Hips: limited L hip flex, IR, ER and ext, painful flex and IR;  R WFL Knees: WFL in supine, limited in prone due to quad tightness.   MMT:  05/11/21: Hip flex: 4+/5, Abd: 4/5, Ext: 4/5 Knees: 5/5   LUMBAR SPECIAL TESTS:  SLR: neg;  + FADIR on L;     TODAY'S TREATMENT   05/21/21: Therapeutic Exercise: Aerobic:   Recumbent bike: L10  x 6 min ; Supine:  S/L:   Seated:  Standing:  Band walks Blue TB 20 ft x 6;  Squat x20, 25 lb ;    Stretches:  LTR x 20; Dynamic stretch: high knees 25 ft x 6;  Neuromuscular Re-education: Manual Therapy:  Hip post mobs, Long leg distraction for hip ; Inf mobs with strap; L knee mobs to inc flexion, PROM for L knee flexion; Skilled palpation and monitoring of soft tissue with dry needling.  Trigger Point Dry-Needling  Treatment instructions: Expect mild to moderate muscle soreness. S/S of pneumothorax if dry needled over a lung field, and to seek immediate medical attention should they occur. Patient verbalized understanding of these instructions and education.  Patient Consent Given: Yes Education handout provided: Yes Muscles treated: Glute min bil; Electrical stimulation performed: No Parameters: N/A Treatment response/outcome: Twitch  response elicited and Palpable decrease in muscle tension    05/19/21: Therapeutic Exercise: Aerobic:   Recumbent bike: L5 x 6 min ; Supine:  SL bridge x 20 bil;  S/L:   Hip abd x 20 bil;  Seated:  Standing:   Band walks Blue TB 20 ft x8; Dead lift 25lb x 15; Squat x20;  Stretches:  STKC 30 sec 2 on R; Hip IR across body 30 sec x 2; Quad stretch 30 sec x 3 bil;  Neuromuscular Re-education: Manual Therapy:  Hip post mobs, Long leg distraction for hip ; L knee mobs to inc flexion, PROM for L knee flexion  05/12/21: Therapeutic Exercise:  Aerobic:   Recumbent bike: L6 x 6 min ; Supine:  Bridging x 10, SL bridge x 10 bil;  S/L:   Hip abd x15 bil; Clams x 15 bil;  Seated: Standing:  Hip abd RTB x 10 bil;  Stretches:  STKC 30 sec 2 on R; Hip IR across body 30 sec x 2;   Neuromuscular Re-education: Manual Therapy: DTM/IASTM to L ITB and TFL;  Hip post mobs, Inf and lat mobs with strap;  Long leg distraction for hip ;     PATIENT EDUCATION:  Education details: updated and reviewed HEP Person educated: Patient Education method: Explanation, Demonstration, Tactile cues, Verbal cues, and Handouts Education comprehension: verbalized understanding, returned demonstration, verbal cues required, tactile cues required, and needs further education   HOME EXERCISE PROGRAM: Access Code: [email or web address]    ASSESSMENT:  CLINICAL IMPRESSION: 05/21/21: Pt with trigger points and pain noted in Bil Glute min- addressed with trial of dry needling today. Will assess effects next visit. He has most back pain in Bil SI region. Will benefit from continued glute and posterior chain strengthening. He is doing well with ability for hip and knee strengthening, without increased pain with activity. He continues to have much stiffness with initial movement.   Objective impairments include Abnormal gait, decreased activity tolerance, decreased endurance, decreased mobility, decreased ROM, decreased strength,  decreased safety awareness, hypomobility, increased muscle spasms, impaired flexibility, improper body mechanics, and pain. These impairments are limiting patient from cleaning, community activity, driving, yard work, and shopping. Personal factors including Time since onset of injury/illness/exacerbation are also affecting patient's functional outcome. Patient will benefit from skilled PT to address above impairments and improve overall function.  REHAB POTENTIAL: Good  CLINICAL DECISION MAKING: Stable/uncomplicated  EVALUATION COMPLEXITY: Low   GOALS: Goals reviewed with patient? Yes  SHORT TERM GOALS:  STG Name Target Date Goal status  1 Patient will be independent with initial HEP   05/25/21 INITIAL  2 Pt to demo improved ROM for L hip to be [facility] for flexion and IR  06/01/21 INITIAL         LONG TERM GOALS:   LTG Name Target Date 8 weeks  Goal status  1 Patient will be independent with final HEP  07/06/21 INITIAL  2 Pt to report decreased pain in L hip, back and knees to 0-2/10 with activity   07/06/21 INITIAL  3 Pt to demonstrate improved strength of L hip to 5/5, to improve ability for jujutsu, and hip abduction motion without pain  07/06/21 INITIAL  4 Pt to demonstrate optimal mechanics and ability for bend, lift, and functional squat without pain, to improve back pain and ability for IADLs.   07/06/21 INITIAL  5 Pt to demo ability for kneeling/ready position without pain greater than 2/10 in hip and knee, to improve ability for exercise and jujutsu activities.   07/06/21 INITIAL       PLAN: PT FREQUENCY: 2x/week  PT DURATION: 8 weeks  PLANNED INTERVENTIONS: Therapeutic exercises, Therapeutic activity, Neuro Muscular re-education, Balance training, Gait training, Patient/Family education, Joint mobilization, Stair  training, Orthotic/Fit training, DME instructions, Dry Needling, Electrical stimulation, Spinal mobilization, Cryotherapy, Moist heat, Taping, Vasopneumatic  device, Traction, Ultrasound, Ionotophoresis 4mg /ml Dexamethasone, and Manual therapy  PLAN FOR NEXT SESSION:   Lyndee Hensen, PT, DPT 12:04 PM  05/21/21

## 2021-05-24 ENCOUNTER — Encounter: Payer: Self-pay | Admitting: Physical Therapy

## 2021-05-24 ENCOUNTER — Other Ambulatory Visit: Payer: Self-pay

## 2021-05-24 ENCOUNTER — Ambulatory Visit: Payer: BC Managed Care – PPO | Admitting: Physical Therapy

## 2021-05-24 DIAGNOSIS — M545 Low back pain, unspecified: Secondary | ICD-10-CM

## 2021-05-24 DIAGNOSIS — M25561 Pain in right knee: Secondary | ICD-10-CM | POA: Diagnosis not present

## 2021-05-24 DIAGNOSIS — G8929 Other chronic pain: Secondary | ICD-10-CM

## 2021-05-24 DIAGNOSIS — M25552 Pain in left hip: Secondary | ICD-10-CM | POA: Diagnosis not present

## 2021-05-24 DIAGNOSIS — M25562 Pain in left knee: Secondary | ICD-10-CM

## 2021-05-24 NOTE — Therapy (Signed)
OUTPATIENT PHYSICAL THERAPY TREATMENT    Patient Name: Brett [name] MRN: [id number] DOB:14-Feb-1976, 46 y.o., male Today's Date: 05/24/2021   PT End of Session - 05/24/21 0808     Visit Number 5    Number of Visits 16    Date for PT Re-Evaluation 07/06/21    Authorization Type BCBS    PT Start Time 0802    PT Stop Time 0845    PT Time Calculation (min) 43 min    Activity Tolerance Patient tolerated treatment well    Behavior During Therapy [facility] for tasks assessed/performed                Past Medical History:  Diagnosis Date   Asthma    Bipolar 1 disorder ([place]) 06/15/2016   Previously followed by Psychiatry. Hx of "anger issues." Hx of medication use has included Seroquel and Ativan, both of which have helped him in the past.    Chronic pain of both knees 06/15/2016   Chronic seasonal allergic rhinitis due to pollen 06/15/2016   Depression    Gastroesophageal reflux disease without esophagitis 06/15/2016   History of diabetes mellitus 05/17/2019   Resolved after > 100 pound weight loss and lifestyle changes. 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 No ongoing issues.   Morbid obesity ([place]) 06/15/2016   Past Surgical History:  Procedure Laterality Date   BICEPS TENDON REPAIR     PANNICULECTOMY  11/15/2018   loose skin removal surgery   Patient Active Problem List   Diagnosis Date Noted   Scalp ringworm 04/23/2021   Somatic dysfunction of spine, lumbar 12/04/2020   Vitamin B12 deficiency 05/17/2019   History of diabetes mellitus 05/17/2019   Attention deficit hyperactivity disorder (ADHD), combined type 09/27/2017   Unilateral primary osteoarthritis, left knee 07/10/2017   Spondylolisthesis, lumbosacral region 07/10/2017   Chronic midline low back pain without sciatica 07/10/2017   Hypogonadism in male 05/20/2017   Bipolar 1 disorder  ([place]) 06/15/2016   Left rotator cuff tear arthropathy, s/p PT and repair 06/15/2016   Chronic seasonal allergic rhinitis due to pollen 06/15/2016   Peripheral polyneuropathy 06/15/2016   Insomnia 06/15/2016   Moderate persistent asthma     PCP: [name], MD  REFERRING PROVIDER: [name], MD  REFERRING DIAG:   THERAPY DIAG:  Chronic bilateral low back pain without sciatica  Pain in left hip  Chronic pain of left knee  Chronic pain of right knee  ONSET DATE:   SUBJECTIVE:  SUBJECTIVE STATEMENT: Pt states less pain this week. Has not worked out as much, but did well with work around [employer].    PERTINENT HISTORY:  none    PAIN:  Are you having pain? Yes NPRS scale: 2/10, up to 8/10 Pain location: L knee  Pain orientation: Left> Right  PAIN TYPE: aching Pain description: intermittent  Aggravating factors:  Kneeling, increased flexion, squat  Relieving factors: none stated   Are you having pain? Yes NPRS scale:   5/10 Pain location: Back Pain orientation: Right and Left  PAIN TYPE: aching Pain description: intermittent  Aggravating factors: Prolonged sitting or standing, fwd bend, lift,  Relieving factors: none stated   PRECAUTIONS: None  WEIGHT BEARING RESTRICTIONS No  FALLS:  Has patient fallen in last 6 months? 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    OBJECTIVE:   DIAGNOSTIC FINDINGS:  Lumbar X-ray: 1. Lumbar disc and posterior element degeneration L2-L3 through L5-S1, with grade 1 spondylolisthesis up to 9 mm at the latter. Small caudal midline disc extrusion at L3-L4. 2.  Capacious underlying spinal canal, no significant lumbar spinal stenosis. Up to mild bilateral lateral recess stenosis at the L4 nerve levels. And moderate bilateral L5 neural foraminal stenosis  Hip X-ray:    Mild bilateral hip osteoarthritis. Knee X-ray:  Tricompartmental DJD without acute findings.   COGNITION: Overall cognitive status: Within functional limits for tasks assessed    PALPATION: Hypomobile L patella, decreased medial glide Mod limitation for bil quad length  Hypomobile L hip, increased stiffness and pain with flexion and IR Pain in L hip, glute med, min, and into ITB,  Soreness in bil low back, SI , into bil glutes    AROM/PROM 05/11/21: Lumbar: WFL, pain with L and R SB  Hips: limited L hip flex, IR, ER and ext, painful flex and IR;  R WFL Knees: WFL in supine, limited in prone due to quad tightness.   MMT:  05/11/21: Hip flex: 4+/5, Abd: 4/5, Ext: 4/5 Knees: 5/5   LUMBAR SPECIAL TESTS:  SLR: neg;  + FADIR on L;     TODAY'S TREATMENT   05/24/21: Therapeutic Exercise: Aerobic:   Recumbent bike: L10  x 8 min ; Supine: Bridging x 10, SL x 10 bil;  S/L:   Seated:  Standing: Rows Black TB x 20;  Band walks Blue TB 20 ft x 6;  Squat x 20, 25 lb ;    Stretches:  LTR x 20; Dynamic stretch: high knees and tin man 25 ft x 4 ea; ITB 30 sec x 2 on R;  Neuromuscular Re-education: Manual Therapy:  L knee mobs to inc flexion, PROM for L knee flexion;    05/21/21: Therapeutic Exercise: Aerobic:   Recumbent bike: L10  x 6 min ; Supine:  S/L:   Seated:  Standing:  Band walks Blue TB 20 ft x 6;  Squat x20, 25 lb ;    Stretches:  LTR x 20; Dynamic stretch: high knees 25 ft x 6;  Neuromuscular Re-education: Manual Therapy:  Hip post mobs, Long leg distraction for hip ; Inf mobs with strap; L knee mobs to inc flexion, PROM for L knee flexion; Skilled palpation and monitoring of soft tissue with dry needling.  Trigger Point Dry-Needling  Treatment instructions:  Expect mild to  moderate muscle soreness. S/S of pneumothorax if dry needled over a lung field, and to seek immediate medical attention should they occur. Patient verbalized understanding of these instructions and education.  Patient Consent Given: Yes Education handout provided: Yes Muscles treated: Glute min bil; Electrical stimulation performed: No Parameters: N/A Treatment response/outcome: Twitch response elicited and Palpable decrease in muscle tension    05/19/21: Therapeutic Exercise: Aerobic:   Recumbent bike: L5 x 6 min ; Supine:  SL bridge x 20 bil;  S/L:   Hip abd x 20 bil;  Seated:  Standing:   Band walks Blue TB 20 ft x8; Dead lift 25lb x 15; Squat x20;  Stretches:  STKC 30 sec 2 on R; Hip IR across body 30 sec x 2; Quad stretch 30 sec x 3 bil;  Neuromuscular Re-education: Manual Therapy:  Hip post mobs, Long leg distraction for hip ; L knee mobs to inc flexion, PROM for L knee flexion    PATIENT EDUCATION:  Education details: updated and reviewed HEP Person educated: Patient Education method: Explanation, Demonstration, Tactile cues, Verbal cues, and Handouts Education comprehension: verbalized understanding, returned demonstration, verbal cues required, tactile cues required, and needs further education   HOME EXERCISE PROGRAM: Access Code: [email or web address]    ASSESSMENT:  CLINICAL IMPRESSION: 05/24/21: Pt doing well with progressive strengthening for hips and LE, without increased pain today. Requires cues for core activation, will benefit from progressive core strengthening, will planks next visit. Showing some improvements in L knee and hip flexion ROM and decreased pain with flexion.   Objective impairments include Abnormal gait, decreased activity tolerance, decreased endurance, decreased mobility, decreased ROM, decreased strength, decreased safety awareness, hypomobility, increased muscle spasms, impaired flexibility, improper body mechanics, and pain. These  impairments are limiting patient from cleaning, community activity, driving, yard work, and shopping. Personal factors including Time since onset of injury/illness/exacerbation are also affecting patient's functional outcome. Patient will benefit from skilled PT to address above impairments and improve overall function.  REHAB POTENTIAL: Good  CLINICAL DECISION MAKING: Stable/uncomplicated  EVALUATION COMPLEXITY: Low   GOALS: Goals reviewed with patient? Yes  SHORT TERM GOALS:  STG Name Target Date Goal status  1 Patient will be independent with initial HEP   05/25/21 INITIAL  2 Pt to demo improved ROM for L hip to be [facility] for flexion and IR  06/01/21 INITIAL         LONG TERM GOALS:   LTG Name Target Date 8 weeks  Goal status  1 Patient will be independent with final HEP  07/06/21 INITIAL  2 Pt to report decreased pain in L hip, back and knees to 0-2/10 with activity   07/06/21 INITIAL  3 Pt to demonstrate improved strength of L hip to 5/5, to improve ability for jujutsu, and hip abduction motion without pain  07/06/21 INITIAL  4 Pt to demonstrate optimal mechanics and ability for bend, lift, and functional squat without pain, to improve back pain and ability for IADLs.   07/06/21 INITIAL  5 Pt to demo ability for kneeling/ready position without pain greater than 2/10 in hip and knee, to improve ability for exercise and jujutsu activities.   07/06/21 INITIAL       PLAN: PT FREQUENCY: 2x/week  PT DURATION: 8 weeks  PLANNED INTERVENTIONS: Therapeutic exercises, Therapeutic activity, Neuro Muscular re-education, Balance training, Gait training, Patient/Family education, Joint mobilization, Stair training, Orthotic/Fit training, DME instructions, Dry Needling, Electrical stimulation, Spinal mobilization, Cryotherapy, Moist heat, Taping, Vasopneumatic device, Traction, Ultrasound, Ionotophoresis 4mg /ml Dexamethasone,  and Manual therapy  PLAN FOR NEXT SESSION: Planks, core, continue  hip and lumbar mobilization  Lyndee Hensen, PT, DPT 8:53 AM  05/24/21

## 2021-05-25 ENCOUNTER — Ambulatory Visit: Payer: BC Managed Care – PPO | Admitting: Physical Therapy

## 2021-05-25 ENCOUNTER — Encounter: Payer: Self-pay | Admitting: Physical Therapy

## 2021-05-25 DIAGNOSIS — M545 Low back pain, unspecified: Secondary | ICD-10-CM | POA: Diagnosis not present

## 2021-05-25 DIAGNOSIS — M25552 Pain in left hip: Secondary | ICD-10-CM | POA: Diagnosis not present

## 2021-05-25 DIAGNOSIS — M25562 Pain in left knee: Secondary | ICD-10-CM

## 2021-05-25 DIAGNOSIS — M25561 Pain in right knee: Secondary | ICD-10-CM | POA: Diagnosis not present

## 2021-05-25 DIAGNOSIS — G8929 Other chronic pain: Secondary | ICD-10-CM

## 2021-05-25 NOTE — Therapy (Signed)
OUTPATIENT PHYSICAL THERAPY TREATMENT    Patient Name: Brett [name] MRN: [id number] DOB:Jul 31, 1975, 46 y.o., male Today's Date: 05/25/2021   PT End of Session - 05/25/21 0809     Visit Number 6    Number of Visits 16    Date for PT Re-Evaluation 07/06/21    Authorization Type BC[employer]    PT Start Time 0803    PT Stop Time 0845    PT Time Calculation (min) 42 min    Activity Tolerance Patient tolerated treatment well    Behavior During Therapy [facility] for tasks assessed/performed                 Past Medical History:  Diagnosis Date   Asthma    Bipolar 1 disorder ([place]) 06/15/2016   Previously followed by Psychiatry. Hx of "anger issues." Hx of medication use has included Seroquel and Ativan, both of which have helped him in the past.    Chronic pain of both knees 06/15/2016   Chronic seasonal allergic rhinitis due to pollen 06/15/2016   Depression    Gastroesophageal reflux disease without esophagitis 06/15/2016   History of diabetes mellitus 05/17/2019   Resolved after > 100 pound weight loss and lifestyle changes. 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 No ongoing issues.   Morbid obesity ([place]) 06/15/2016   Past Surgical History:  Procedure Laterality Date   BICEPS TENDON REPAIR     PANNICULECTOMY  11/15/2018   loose skin removal surgery   Patient Active Problem List   Diagnosis Date Noted   Scalp ringworm 04/23/2021   Somatic dysfunction of spine, lumbar 12/04/2020   Vitamin B12 deficiency 05/17/2019   History of diabetes mellitus 05/17/2019   Attention deficit hyperactivity disorder (ADHD), combined type 09/27/2017   Unilateral primary osteoarthritis, left knee 07/10/2017   Spondylolisthesis, lumbosacral region 07/10/2017   Chronic midline low back pain without sciatica 07/10/2017   Hypogonadism in male 05/20/2017   Bipolar 1 disorder  ([place]) 06/15/2016   Left rotator cuff tear arthropathy, s/p PT and repair 06/15/2016   Chronic seasonal allergic rhinitis due to pollen 06/15/2016   Peripheral polyneuropathy 06/15/2016   Insomnia 06/15/2016   Moderate persistent asthma     PCP: [name], MD  REFERRING PROVIDER: [name], MD  REFERRING DIAG:   THERAPY DIAG:  Chronic bilateral low back pain without sciatica  Pain in left hip  Chronic pain of left knee  Chronic pain of right knee  ONSET DATE:   SUBJECTIVE:  SUBJECTIVE STATEMENT: Pt with soreness in R back and hip today.    PERTINENT HISTORY:  none    PAIN:  Are you having pain? Yes NPRS scale: 2/10, up to 8/10 Pain location: L knee  Pain orientation: Left> Right  PAIN TYPE: aching Pain description: intermittent  Aggravating factors:  Kneeling, increased flexion, squat  Relieving factors: none stated   Are you having pain? Yes NPRS scale:   5/10 Pain location: Back Pain orientation: Right and Left  PAIN TYPE: aching Pain description: intermittent  Aggravating factors: Prolonged sitting or standing, fwd bend, lift,  Relieving factors: none stated   PRECAUTIONS: None  WEIGHT BEARING RESTRICTIONS No  FALLS:  Has patient fallen in last 6 months? 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    OBJECTIVE:   DIAGNOSTIC FINDINGS:  Lumbar X-ray: 1. Lumbar disc and posterior element degeneration L2-L3 through L5-S1, with grade 1 spondylolisthesis up to 9 mm at the latter. Small caudal midline disc extrusion at L3-L4. 2. Capacious underlying spinal canal, no significant lumbar  spinal stenosis. Up to mild bilateral lateral recess stenosis at the L4 nerve levels. And moderate bilateral L5 neural foraminal stenosis  Hip X-ray:    Mild bilateral hip osteoarthritis. Knee X-ray:  Tricompartmental DJD without acute findings.   COGNITION: Overall cognitive status: Within functional limits for tasks assessed    PALPATION: Hypomobile L patella, decreased medial glide Mod limitation for bil quad length  Hypomobile L hip, increased stiffness and pain with flexion and IR Pain in L hip, glute med, min, and into ITB,  Soreness in bil low back, SI , into bil glutes    AROM/PROM 05/11/21: Lumbar: WFL, pain with L and R SB  Hips: limited L hip flex, IR, ER and ext, painful flex and IR;  R WFL Knees: WFL in supine, limited in prone due to quad tightness.   MMT:  05/11/21: Hip flex: 4+/5, Abd: 4/5, Ext: 4/5 Knees: 5/5   LUMBAR SPECIAL TESTS:  SLR: neg;  + FADIR on L;     TODAY'S TREATMENT   05/25/21: Therapeutic Exercise: Aerobic:   Recumbent bike: L8  x 8 min; Supine: Bridging x 10, 20lb x 10; SL x 10 bil; Planks x 2 min;  S/L:   Seated:  Standing:  SLS 15 sec x5 bil;    Stretches:   Seated fig 4 x1 min bil; Prayer stretch  L, R, Center , 30 sec x 2 ea Neuromuscular Re-education: Manual Therapy:  L hip mobs, posterior; Long leg distraction for back and hip on L; Lumbar PA mobs; Skilled palpation and monitoring of soft tissue with dry needling.  Trigger Point Dry-Needling  Treatment instructions: Expect mild to moderate muscle soreness. S/S of pneumothorax if dry needled over a lung field, and to seek immediate medical attention should they occur. Patient verbalized understanding of these instructions and education.  Patient Consent Given: Yes Education handout provided: Previously provided Muscles treated: R glute min Electrical stimulation performed: No Parameters: N/A Treatment response/outcome: Twitch response elicited and Palpable decrease in muscle  tension      05/24/21: Therapeutic Exercise: Aerobic:   Recumbent bike: L10  x 8 min ; Supine: Bridging x 10, SL x 10 bil;  S/L:   Seated:  Standing: Rows Black TB x 20;  Band walks Blue  TB 20 ft x 6;  Squat x 20, 25 lb ;    Stretches:  LTR x 20; Dynamic stretch: high knees and tin man 25 ft x 4 ea; ITB 30 sec x 2 on R;  Neuromuscular Re-education: Manual Therapy:  L knee mobs to inc flexion, PROM for L knee flexion;     PATIENT EDUCATION:  Education details: updated and reviewed HEP Person educated: Patient Education method: Explanation, Demonstration, Tactile cues, Verbal cues, and Handouts Education comprehension: verbalized understanding, returned demonstration, verbal cues required, tactile cues required, and needs further education   HOME EXERCISE PROGRAM: Access Code: [email or web address]    ASSESSMENT:  CLINICAL IMPRESSION: 05/25/21: Continued manual for hip mobility, pain, and back pain. Still has discomfort and stiffness in R hip. Pt with difficulty and instability with SLS today, added to HEP for continued practice. Will benefit from continued strengthening.   Objective impairments include Abnormal gait, decreased activity tolerance, decreased endurance, decreased mobility, decreased ROM, decreased strength, decreased safety awareness, hypomobility, increased muscle spasms, impaired flexibility, improper body mechanics, and pain. These impairments are limiting patient from cleaning, community activity, driving, yard work, and shopping. Personal factors including Time since onset of injury/illness/exacerbation are also affecting patient's functional outcome. Patient will benefit from skilled PT to address above impairments and improve overall function.  REHAB POTENTIAL: Good  CLINICAL DECISION MAKING: Stable/uncomplicated  EVALUATION COMPLEXITY: Low   GOALS: Goals reviewed with patient? Yes  SHORT TERM GOALS:  STG Name Target Date Goal status  1 Patient will be  independent with initial HEP   05/25/21 INITIAL  2 Pt to demo improved ROM for L hip to be [facility] for flexion and IR  06/01/21 INITIAL         LONG TERM GOALS:   LTG Name Target Date 8 weeks  Goal status  1 Patient will be independent with final HEP  07/06/21 INITIAL  2 Pt to report decreased pain in L hip, back and knees to 0-2/10 with activity   07/06/21 INITIAL  3 Pt to demonstrate improved strength of L hip to 5/5, to improve ability for jujutsu, and hip abduction motion without pain  07/06/21 INITIAL  4 Pt to demonstrate optimal mechanics and ability for bend, lift, and functional squat without pain, to improve back pain and ability for IADLs.   07/06/21 INITIAL  5 Pt to demo ability for kneeling/ready position without pain greater than 2/10 in hip and knee, to improve ability for exercise and jujutsu activities.   07/06/21 INITIAL       PLAN: PT FREQUENCY: 2x/week  PT DURATION: 8 weeks  PLANNED INTERVENTIONS: Therapeutic exercises, Therapeutic activity, Neuro Muscular re-education, Balance training, Gait training, Patient/Family education, Joint mobilization, Stair training, Orthotic/Fit training, DME instructions, Dry Needling, Electrical stimulation, Spinal mobilization, Cryotherapy, Moist heat, Taping, Vasopneumatic device, Traction, Ultrasound, Ionotophoresis 4mg /ml Dexamethasone, and Manual therapy  PLAN FOR NEXT SESSION: Planks, core, continue hip and lumbar mobilization  Lyndee Hensen, PT, DPT 8:09 AM  05/25/21

## 2021-05-26 NOTE — Progress Notes (Signed)
?Charlann Boxer D.O. ?[facility] ?[place] ?Phone: [phone] ?Subjective:   ?I, [name], am serving as a scribe for Dr. Hulan Saas. ?This visit occurred during the SARS-CoV-2 public health emergency.  Safety protocols were in place, including screening questions prior to the visit, additional usage of staff PPE, and extensive cleaning of exam room while observing appropriate contact time as indicated for disinfecting solutions.  ?I'm seeing this patient by the request  of:  [name], MD ? ?CC: Back pain and neck pain, knee pain follow-up ? ?[id number]  ?Sarp Vernier is a 46 y.o. male coming in with complaint of back and neck pain. Epidural 04/27/2021. Also f/u for L knee pain. Synvisc approved for patient. Patient states that he is sore when he does PT and still has pain each day in back and knee. No change in pain since last visit.  ? ?Medications patient has been prescribed: Diflucan ? ?Taking: ? ? ?  ? ? ? ? ?Reviewed prior external information including notes and imaging from previsou exam, outside providers and external EMR if available.  ? ?As well as notes that were available from care everywhere and other healthcare systems. ? ?Past medical history, social, surgical and family history all reviewed in electronic medical record.  No pertanent information unless stated regarding to the chief complaint.  ? ?Past Medical History:  ?Diagnosis Date  ? Asthma   ? Bipolar 1 disorder ([place]) 06/15/2016  ? Previously followed by Psychiatry. Hx of "anger issues." Hx of medication use has included Seroquel and Ativan, both of which have helped him in the past.   ? Chronic pain of both knees 06/15/2016  ? Chronic seasonal allergic rhinitis due to pollen 06/15/2016  ? Depression   ? Gastroesophageal reflux disease without esophagitis 06/15/2016  ? History of diabetes mellitus 05/17/2019  ? Resolved after > 100 pound weight loss and lifestyle changes. Chronic  presumed diabetic peripheral neuropathy in feet.   ? Hypertension   ? Insomnia 06/15/2016  ? Left rotator cuff tear arthropathy 06/15/2016  ? s/p repair and PT. No ongoing issues.  ? Morbid obesity ([place]) 06/15/2016  ?  ?No Known Allergies ? ? ?Review of Systems: ? No headache, visual changes, nausea, vomiting, diarrhea, constipation, dizziness, abdominal pain, skin rash, fevers, chills, night sweats, weight loss, swollen lymph nodes, body aches, joint swelling, chest pain, shortness of breath, mood changes. POSITIVE muscle aches ? ?Objective  ?Blood pressure 112/74, pulse 79, height 5\' 11"  (1.803 m), weight 216 lb (98 kg), SpO2 99 %. ?  ?General: No apparent distress alert and oriented x3 mood and affect normal, dressed appropriately.  ?HEENT: Pupils equal, extraocular movements intact  ?Respiratory: Patient's speak in full sentences and does not appear short of breath  ?Cardiovascular: No lower extremity edema, non tender, no erythema  ?Low back exam does have some loss of lordosis.  Patient does have tightness with FABER test bilaterally.  Patient does have mild atrophy of the lower extremities bilaterally still.  Patient does have decent strength but does have some instability noted of the left knee with valgus and varus force.  Mild swelling noted of the patellofemoral joint. ? ?Osteopathic findings ? ?C2 flexed rotated and side bent right ?C6 flexed rotated and side bent left ?T3 extended rotated and side bent right inhaled rib ?T9 extended rotated and side bent left ?L2 flexed rotated and side bent right ?Sacrum right on right ? ? ?After informed written and verbal  consent, patient was seated on exam table. Left knee was prepped with alcohol swab and utilizing anterolateral approach, patient's left knee space was injected with 48 mg/23mL of Synvisc One(sodium hyaluronate) in a prefilled syringe was injected easily into the knee through a 22-gauge needle.  Patient tolerated the procedure well without immediate  complications. ? ?  ?Assessment and Plan: ? 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  We discussed with patient about icing regimen and home exercises.  Patient did respond only a little bit to the epidurals.  We discussed the possibility of repeating the injections or referral for surgery.  Patient has decided he would like to start it and continue with the formal physical therapy and the osteopathic manipulation.  Robaxin for breakthrough pain.  Patient is on Lyrica.  Follow-up with me again 6 to 8 weeks ? ?Unilateral primary osteoarthritis, left knee ?Viscosupplementation given today.  Discussed with patient that this may give more discomfort for the first couple days and then slowly improve.  Patient does have significant arthritic changes of 1 compartment.  Patient wants to continue to be active and avoid surgery secondary to patient's young age.  Follow-up with me again 6 to 8 weeks  ? ?Nonallopathic problems ? ?Decision today to treat with OMT was based on Physical Exam ? ?After verbal consent patient was treated with HVLA, ME, FPR techniques in cervical, rib, thoracic, lumbar, and sacral  areas ? ?Patient tolerated the procedure well with improvement in symptoms ? ?Patient given exercises, stretches and lifestyle modifications ? ?See medications in patient instructions if given ? ?Patient will follow up in 4-8 weeks ? ?  ? ? ?The above documentation has been reviewed and is accurate and complete [name], DO ? ? ? ?  ? ? Note: This dictation was prepared with Dragon dictation along with smaller phrase technology. Any transcriptional errors that result from this process are unintentional.    ?  ?  ? ?

## 2021-05-27 ENCOUNTER — Ambulatory Visit: Payer: BC Managed Care – PPO | Admitting: Family Medicine

## 2021-05-27 ENCOUNTER — Encounter: Payer: Self-pay | Admitting: Family Medicine

## 2021-05-27 ENCOUNTER — Other Ambulatory Visit: Payer: Self-pay

## 2021-05-27 VITALS — BP 112/74 | HR 79 | Ht 71.0 in | Wt 216.0 lb

## 2021-05-27 DIAGNOSIS — M9903 Segmental and somatic dysfunction of lumbar region: Secondary | ICD-10-CM | POA: Diagnosis not present

## 2021-05-27 DIAGNOSIS — M9908 Segmental and somatic dysfunction of rib cage: Secondary | ICD-10-CM

## 2021-05-27 DIAGNOSIS — M9904 Segmental and somatic dysfunction of sacral region: Secondary | ICD-10-CM

## 2021-05-27 DIAGNOSIS — M9902 Segmental and somatic dysfunction of thoracic region: Secondary | ICD-10-CM

## 2021-05-27 DIAGNOSIS — M9901 Segmental and somatic dysfunction of cervical region: Secondary | ICD-10-CM

## 2021-05-27 DIAGNOSIS — M4317 Spondylolisthesis, lumbosacral region: Secondary | ICD-10-CM

## 2021-05-27 DIAGNOSIS — M1712 Unilateral primary osteoarthritis, left knee: Secondary | ICD-10-CM

## 2021-05-27 NOTE — Assessment & Plan Note (Signed)
Viscosupplementation given today.  Discussed with patient that this may give more discomfort for the first couple days and then slowly improve.  Patient does have significant arthritic changes of 1 compartment.  Patient wants to continue to be active and avoid surgery secondary to patient's young age.  Follow-up with me again 6 to 8 weeks ?

## 2021-05-27 NOTE — Assessment & Plan Note (Signed)
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  We discussed with patient about icing regimen and home exercises.  Patient did respond only a little bit to the epidurals.  We discussed the possibility of repeating the injections or referral for surgery.  Patient has decided he would like to start it and continue with the formal physical therapy and the osteopathic manipulation.  Robaxin for breakthrough pain.  Patient is on Lyrica.  Follow-up with me again 6 to 8 weeks ?

## 2021-05-27 NOTE — Patient Instructions (Signed)
You know drill on the knee ?See me in 6-8 weeks ?

## 2021-05-31 ENCOUNTER — Encounter: Payer: Self-pay | Admitting: Physical Therapy

## 2021-05-31 ENCOUNTER — Other Ambulatory Visit: Payer: Self-pay

## 2021-05-31 ENCOUNTER — Ambulatory Visit: Payer: BC Managed Care – PPO | Admitting: Physical Therapy

## 2021-05-31 DIAGNOSIS — M25552 Pain in left hip: Secondary | ICD-10-CM

## 2021-05-31 DIAGNOSIS — M25561 Pain in right knee: Secondary | ICD-10-CM

## 2021-05-31 DIAGNOSIS — M545 Low back pain, unspecified: Secondary | ICD-10-CM | POA: Diagnosis not present

## 2021-05-31 DIAGNOSIS — G8929 Other chronic pain: Secondary | ICD-10-CM

## 2021-05-31 DIAGNOSIS — M25562 Pain in left knee: Secondary | ICD-10-CM | POA: Diagnosis not present

## 2021-05-31 NOTE — Therapy (Signed)
OUTPATIENT PHYSICAL THERAPY TREATMENT    Patient Name: Brett [name] MRN: [id number] DOB:[birth year], 46 y.o., male Today's Date: 05/31/2021   PT End of Session - 05/31/21 0836     Visit Number 7    Number of Visits 16    Date for PT Re-Evaluation 07/06/21    Authorization Type BCBS    PT Start Time 0840    PT Stop Time 0940    PT Time Calculation (min) 60 min    Activity Tolerance Patient tolerated treatment well    Behavior During Therapy [facility] for tasks assessed/performed                 Past Medical History:  Diagnosis Date   Asthma    Bipolar 1 disorder ([place]) 06/15/2016   Previously followed by Psychiatry. Hx of "anger issues." Hx of medication use has included Seroquel and Ativan, both of which have helped him in the past.    Chronic pain of both knees 06/15/2016   Chronic seasonal allergic rhinitis due to pollen 06/15/2016   Depression    Gastroesophageal reflux disease without esophagitis 06/15/2016   History of diabetes mellitus 05/17/2019   Resolved after > 100 pound weight loss and lifestyle changes. 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 No ongoing issues.   Morbid obesity ([place]) 06/15/2016   Past Surgical History:  Procedure Laterality Date   BICEPS TENDON REPAIR     PANNICULECTOMY  11/15/2018   loose skin removal surgery   Patient Active Problem List   Diagnosis Date Noted   Scalp ringworm 04/23/2021   Somatic dysfunction of spine, lumbar 12/04/2020   Vitamin B12 deficiency 05/17/2019   History of diabetes mellitus 05/17/2019   Attention deficit hyperactivity disorder (ADHD), combined type 09/27/2017   Unilateral primary osteoarthritis, left knee 07/10/2017   Spondylolisthesis, lumbosacral region 07/10/2017   Chronic midline low back pain without sciatica 07/10/2017   Hypogonadism in male 05/20/2017   Bipolar 1 disorder  ([place]) 06/15/2016   Left rotator cuff tear arthropathy, s/p PT and repair 06/15/2016   Chronic seasonal allergic rhinitis due to pollen 06/15/2016   Peripheral polyneuropathy 06/15/2016   Insomnia 06/15/2016   Moderate persistent asthma     PCP: [name], MD  REFERRING PROVIDER: [name], MD  REFERRING DIAG:   THERAPY DIAG:  Chronic bilateral low back pain without sciatica  Pain in left hip  Chronic pain of left knee  Chronic pain of right knee  ONSET DATE:   SUBJECTIVE:  SUBJECTIVE STATEMENT: Pt with continued soreness in low back, and hips (L). L knee feeling a bit better after injection last week. Still feels very stiff each time he is getting up, feels like stretches are not getting easier.    PERTINENT HISTORY:  none    PAIN:  Are you having pain? Yes NPRS scale: 2/10, up to 8/10 Pain location: L knee  Pain orientation: Left> Right  PAIN TYPE: aching Pain description: intermittent  Aggravating factors:  Kneeling, increased flexion, squat  Relieving factors: none stated   Are you having pain? Yes NPRS scale:   5/10 Pain location: Back Pain orientation: Right and Left  PAIN TYPE: aching Pain description: intermittent  Aggravating factors: Prolonged sitting or standing, fwd bend, lift,  Relieving factors: none stated   PRECAUTIONS: None  WEIGHT BEARING RESTRICTIONS No  FALLS:  Has patient fallen in last 6 months? 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    OBJECTIVE:   DIAGNOSTIC FINDINGS:  Lumbar X-ray: 1. Lumbar disc and posterior element degeneration L2-L3 through L5-S1, with  grade 1 spondylolisthesis up to 9 mm at the latter. Small caudal midline disc extrusion at L3-L4. 2. Capacious underlying spinal canal, no significant lumbar spinal stenosis. Up to mild bilateral lateral recess stenosis at the L4 nerve levels. And moderate bilateral L5 neural foraminal stenosis  Hip X-ray:    Mild bilateral hip osteoarthritis. Knee X-ray:  Tricompartmental DJD without acute findings.   COGNITION: Overall cognitive status: Within functional limits for tasks assessed    PALPATION: Hypomobile L patella, decreased medial glide Mod limitation for bil quad length  Hypomobile L hip, increased stiffness and pain with flexion and IR Pain in L hip, glute med, min, and into ITB,  Soreness in bil low back, SI , into bil glutes    AROM/PROM 05/11/21: Lumbar: WFL, pain with L and R SB  Hips: limited L hip flex, IR, ER and ext, painful flex and IR;  R WFL Knees: WFL in supine, limited in prone due to quad tightness.   MMT:  05/11/21: Hip flex: 4+/5, Abd: 4/5, Ext: 4/5 Knees: 5/5   LUMBAR SPECIAL TESTS:  SLR: neg;  + FADIR on L;     TODAY'S TREATMENT   05/31/21: Therapeutic Exercise: Aerobic:   Recumbent bike: L8  x 8 min;  Supine: Prone: superman, arms in T, x 10, 5 sec;  SLR x 10 bil; Bicycle with TA x 15;  S/L:   Seated:  Standing:  SLS 25 sec x 3 bil;  Hip hinge motion with UE flex/ext x 1 min; Dead lift 25 lb x 15; Squats 20lb x 15; Reviewed walk/march bwd for HEP.  Stretches:   Seated fig 4 x1 min bil;  Neuromuscular Re-education: Manual Therapy:  Long leg distraction for back and hip on L; Manual hip flexion and IR stretch, Manual knee flexion stretch, patella mobs on L;     05/25/21: Therapeutic Exercise: Aerobic:   Recumbent bike: L8  x 8 min; Supine: Bridging x 10, 20lb x 10; SL x 10 bil; Planks x 2 min;  S/L:   Seated:  Standing:  SLS 15 sec x5 bil;    Stretches:   Seated fig 4 x1 min bil; Prayer stretch  L, R, Center , 30 sec x 2 ea Neuromuscular  Re-education:  Manual Therapy:  L hip mobs, posterior; Long leg distraction for back and hip on L; Lumbar PA mobs; Skilled palpation and monitoring of soft tissue with dry needling.  Trigger Point Dry-Needling  Treatment instructions: Expect mild to moderate muscle soreness. S/S of pneumothorax if dry needled over a lung field, and to seek immediate medical attention should they occur. Patient verbalized understanding of these instructions and education.  Patient Consent Given: Yes Education handout provided: Previously provided Muscles treated: R glute min Electrical stimulation performed: No Parameters: N/A Treatment response/outcome: Twitch response elicited and Palpable decrease in muscle tension    PATIENT EDUCATION:  Education details: updated and reviewed HEP Person educated: Patient Education method: Explanation, Demonstration, Tactile cues, Verbal cues, and Handouts Education comprehension: verbalized understanding, returned demonstration, verbal cues required, tactile cues required, and needs further education   HOME EXERCISE PROGRAM: Access Code: [email or web address]    ASSESSMENT:  CLINICAL IMPRESSION: 05/31/21:  Reviewed HEP, discussed doing more movement and strengthening throughout day. Pt will benefit from continued strengthening for posterior chain, hips, and knee- is challenged with most of these exercises today. Discussed standing posture, pt with tendency for mild extension in standing, will benefit from continued work and education on this. Plan to continue manual for back and hip pain, and continue strengthening as tolerated.    Objective impairments include Abnormal gait, decreased activity tolerance, decreased endurance, decreased mobility, decreased ROM, decreased strength, decreased safety awareness, hypomobility, increased muscle spasms, impaired flexibility, improper body mechanics, and pain. These impairments are limiting patient from cleaning, community activity,  driving, yard work, and shopping. Personal factors including Time since onset of injury/illness/exacerbation are also affecting patient's functional outcome. Patient will benefit from skilled PT to address above impairments and improve overall function.  REHAB POTENTIAL: Good  CLINICAL DECISION MAKING: Stable/uncomplicated  EVALUATION COMPLEXITY: Low   GOALS: Goals reviewed with patient? Yes  SHORT TERM GOALS:  STG Name Target Date Goal status  1 Patient will be independent with initial HEP   05/25/21 INITIAL  2 Pt to demo improved ROM for L hip to be [facility] for flexion and IR  06/01/21 INITIAL         LONG TERM GOALS:   LTG Name Target Date 8 weeks  Goal status  1 Patient will be independent with final HEP  07/06/21 INITIAL  2 Pt to report decreased pain in L hip, back and knees to 0-2/10 with activity   07/06/21 INITIAL  3 Pt to demonstrate improved strength of L hip to 5/5, to improve ability for jujutsu, and hip abduction motion without pain  07/06/21 INITIAL  4 Pt to demonstrate optimal mechanics and ability for bend, lift, and functional squat without pain, to improve back pain and ability for IADLs.   07/06/21 INITIAL  5 Pt to demo ability for kneeling/ready position without pain greater than 2/10 in hip and knee, to improve ability for exercise and jujutsu activities.   07/06/21 INITIAL       PLAN: PT FREQUENCY: 2x/week  PT DURATION: 8 weeks  PLANNED INTERVENTIONS: Therapeutic exercises, Therapeutic activity, Neuro Muscular re-education, Balance training, Gait training, Patient/Family education, Joint mobilization, Stair training, Orthotic/Fit training, DME instructions, Dry Needling, Electrical stimulation, Spinal mobilization, Cryotherapy, Moist heat, Taping, Vasopneumatic device, Traction, Ultrasound, Ionotophoresis '4mg'$ /ml Dexamethasone, and Manual therapy  PLAN FOR NEXT SESSION: Planks, core, continue hip and lumbar mobilization  Lyndee Hensen, PT, DPT 10:00 AM   05/31/21

## 2021-06-03 ENCOUNTER — Encounter: Payer: Self-pay | Admitting: Physical Therapy

## 2021-06-03 ENCOUNTER — Ambulatory Visit: Payer: BC Managed Care – PPO | Admitting: Physical Therapy

## 2021-06-03 DIAGNOSIS — M25552 Pain in left hip: Secondary | ICD-10-CM

## 2021-06-03 DIAGNOSIS — M25561 Pain in right knee: Secondary | ICD-10-CM | POA: Diagnosis not present

## 2021-06-03 DIAGNOSIS — M25562 Pain in left knee: Secondary | ICD-10-CM | POA: Diagnosis not present

## 2021-06-03 DIAGNOSIS — M545 Low back pain, unspecified: Secondary | ICD-10-CM

## 2021-06-03 DIAGNOSIS — G8929 Other chronic pain: Secondary | ICD-10-CM

## 2021-06-03 NOTE — Therapy (Signed)
OUTPATIENT PHYSICAL THERAPY TREATMENT    Patient Name: Teige Rountree MRN: [id number] DOB:1976-03-02, 46 y.o., male Today's Date: 06/03/2021   PT End of Session - 06/03/21 0803     Visit Number 8    Number of Visits 16    Date for PT Re-Evaluation 07/06/21    Authorization Type BCBS    PT Start Time 0805    PT Stop Time 0900    PT Time Calculation (min) 55 min    Activity Tolerance Patient tolerated treatment well    Behavior During Therapy [facility] for tasks assessed/performed                 Past Medical History:  Diagnosis Date   Asthma    Bipolar 1 disorder ([place]) 06/15/2016   Previously followed by Psychiatry. Hx of "anger issues." Hx of medication use has included Seroquel and Ativan, both of which have helped him in the past.    Chronic pain of both knees 06/15/2016   Chronic seasonal allergic rhinitis due to pollen 06/15/2016   Depression    Gastroesophageal reflux disease without esophagitis 06/15/2016   History of diabetes mellitus 05/17/2019   Resolved after > 100 pound weight loss and lifestyle changes. 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 No ongoing issues.   Morbid obesity ([place]) 06/15/2016   Past Surgical History:  Procedure Laterality Date   BICEPS TENDON REPAIR     PANNICULECTOMY  11/15/2018   loose skin removal surgery   Patient Active Problem List   Diagnosis Date Noted   Scalp ringworm 04/23/2021   Somatic dysfunction of spine, lumbar 12/04/2020   Vitamin B12 deficiency 05/17/2019   History of diabetes mellitus 05/17/2019   Attention deficit hyperactivity disorder (ADHD), combined type 09/27/2017   Unilateral primary osteoarthritis, left knee 07/10/2017   Spondylolisthesis, lumbosacral region 07/10/2017   Chronic midline low back pain without sciatica 07/10/2017   Hypogonadism in male 05/20/2017   Bipolar 1 disorder  ([place]) 06/15/2016   Left rotator cuff tear arthropathy, s/p PT and repair 06/15/2016   Chronic seasonal allergic rhinitis due to pollen 06/15/2016   Peripheral polyneuropathy 06/15/2016   Insomnia 06/15/2016   Moderate persistent asthma     PCP: [name], MD  REFERRING PROVIDER: [name], MD  REFERRING DIAG:   THERAPY DIAG:  Chronic bilateral low back pain without sciatica  Pain in left hip  Chronic pain of left knee  Chronic pain of right knee  ONSET DATE:   SUBJECTIVE:  SUBJECTIVE STATEMENT: Pt states soreness all over today. He has increased muscle soreness in low back, glutes and hamstrings from exercising this week. He notes improving L knee pain. Has been dong HEP.     PERTINENT HISTORY:  none    PAIN:  Are you having pain? Yes NPRS scale: 2/10, up to 8/10 Pain location: L knee  Pain orientation: Left> Right  PAIN TYPE: aching Pain description: intermittent  Aggravating factors:  Kneeling, increased flexion, squat  Relieving factors: none stated   Are you having pain? Yes NPRS scale:   5/10 Pain location: Back Pain orientation: Right and Left  PAIN TYPE: aching Pain description: intermittent  Aggravating factors: Prolonged sitting or standing, fwd bend, lift,  Relieving factors: none stated   PRECAUTIONS: None  WEIGHT BEARING RESTRICTIONS No  FALLS:  Has patient fallen in last 6 months? 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    OBJECTIVE:   DIAGNOSTIC FINDINGS:  Lumbar X-ray: 1. Lumbar disc and posterior element degeneration L2-L3 through L5-S1, with grade 1  spondylolisthesis up to 9 mm at the latter. Small caudal midline disc extrusion at L3-L4. 2. Capacious underlying spinal canal, no significant lumbar spinal stenosis. Up to mild bilateral lateral recess stenosis at the L4 nerve levels. And moderate bilateral L5 neural foraminal stenosis  Hip X-ray:    Mild bilateral hip osteoarthritis. Knee X-ray:  Tricompartmental DJD without acute findings.   COGNITION: Overall cognitive status: Within functional limits for tasks assessed    PALPATION: Hypomobile L patella, decreased medial glide Mod limitation for bil quad length  Hypomobile L hip, increased stiffness and pain with flexion and IR Pain in L hip, glute med, min, and into ITB,  Soreness in bil low back, SI , into bil glutes    AROM/PROM 05/11/21: Lumbar: WFL, pain with L and R SB  Hips: limited L hip flex, IR, ER and ext, painful flex and IR;  R WFL Knees: WFL in supine, limited in prone due to quad tightness.   MMT:  05/11/21: Hip flex: 4+/5, Abd: 4/5, Ext: 4/5 Knees: 5/5   LUMBAR SPECIAL TESTS:  SLR: neg;  + FADIR on L;     TODAY'S TREATMENT  06/03/21: Therapeutic Exercise: Aerobic:    Supine: reviewed mechanics for superman (for HEP) S/L:   Seated:  Standing: fwd lumbar flexion for HSS and lumbar mobility x 5;  Stretches:   Supine fig 4 30 sec x 2 bil; HSS with strap, ITB with strap x2 ea bil; LTR x 5;  Neuromuscular Re-education: Manual Therapy:  Long leg distraction for back and hip on L and R; Manual L hip flexion and IR stretch, L hip post and ant mobs; Manual piriformis stretch bil; Manual L knee flexion stretch, mobilization to inc L knee flexion; Bil quad stretch in prone; Lumbar PA mobs; IASM and DTM to bil lumbar paraspinals and bil glute med/min;    05/31/21: Therapeutic Exercise: Aerobic:   Recumbent bike: L8  x 8 min;  Supine: Prone: superman, arms in T, x 10, 5 sec;  SLR x 10 bil; Bicycle with TA x 15;  S/L:   Seated:  Standing:  SLS 25 sec x 3 bil;   Hip hinge motion with UE flex/ext x 1 min; Dead lift 25 lb x 15; Squats 20lb x 15;  Reviewed walk/march bwd for HEP.  Stretches:   Seated fig 4 x1 min bil;  Neuromuscular Re-education: Manual Therapy:  Long leg distraction for back and hip on L; Manual hip flexion and IR stretch, Manual knee flexion stretch, patella mobs on L;     PATIENT EDUCATION:  Education details:  reviewed HEP Person educated: Patient Education method: Explanation, Demonstration, Tactile cues, Verbal cues, and Handouts Education comprehension: verbalized understanding, returned demonstration, verbal cues required, tactile cues required, and needs further education   HOME EXERCISE PROGRAM: Access Code: [email or web address]    ASSESSMENT:  CLINICAL IMPRESSION: 06/03/21: Focus on manual therapy today for joint stiffness and muscle pain relief. Pt with improving L knee mobility and pain. He has improving hip mobility, but still very painful with full flexion with IR. He also has increased pain with resisted hip rotation in higher flexion position. Lumbar paraspinals sore today with manual tissue release, likely from increased use this week, with superman exercise. Discussed continued need for strengthening, did review optimal mechanics for this, and discussed doing every other day or every 2 days, to decrease amount of muscle soreness. Pt to benefit from continued care.    Objective impairments include Abnormal gait, decreased activity tolerance, decreased endurance, decreased mobility, decreased ROM, decreased strength, decreased safety awareness, hypomobility, increased muscle spasms, impaired flexibility, improper body mechanics, and pain. These impairments are limiting patient from cleaning, community activity, driving, yard work, and shopping. Personal factors including Time since onset of injury/illness/exacerbation are also affecting patient's functional outcome. Patient will benefit from skilled PT to address above impairments  and improve overall function.  REHAB POTENTIAL: Good  CLINICAL DECISION MAKING: Stable/uncomplicated  EVALUATION COMPLEXITY: Low   GOALS: Goals reviewed with patient? Yes  SHORT TERM GOALS:  STG Name Target Date Goal status  1 Patient will be independent with initial HEP   05/25/21 INITIAL  2 Pt to demo improved ROM for L hip to be [facility] for flexion and IR  06/01/21 INITIAL         LONG TERM GOALS:   LTG Name Target Date 8 weeks  Goal status  1 Patient will be independent with final HEP  07/06/21 INITIAL  2 Pt to report decreased pain in L hip, back and knees to 0-2/10 with activity   07/06/21 INITIAL  3 Pt to demonstrate improved strength of L hip to 5/5, to improve ability for jujutsu, and hip abduction motion without pain  07/06/21 INITIAL  4 Pt to demonstrate optimal mechanics and ability for bend, lift, and functional squat without pain, to improve back pain and ability for IADLs.   07/06/21 INITIAL  5 Pt to demo ability for kneeling/ready position without pain greater than 2/10 in hip and knee, to improve ability for exercise and jujutsu activities.   07/06/21 INITIAL       PLAN: PT FREQUENCY: 2x/week  PT DURATION: 8 weeks  PLANNED INTERVENTIONS: Therapeutic exercises, Therapeutic activity, Neuro Muscular re-education, Balance training, Gait training, Patient/Family education, Joint mobilization, Stair training, Orthotic/Fit training, DME instructions, Dry Needling, Electrical stimulation, Spinal mobilization, Cryotherapy, Moist heat, Taping, Vasopneumatic device, Traction, Ultrasound, Ionotophoresis '4mg'$ /ml Dexamethasone, and Manual therapy  PLAN FOR NEXT SESSION: Planks, core, continue hip and lumbar mobilization  Lyndee Hensen, PT, DPT 9:14 AM  06/03/21

## 2021-06-08 ENCOUNTER — Encounter: Payer: Self-pay | Admitting: Physical Therapy

## 2021-06-08 ENCOUNTER — Ambulatory Visit: Payer: BC Managed Care – PPO | Admitting: Physical Therapy

## 2021-06-08 DIAGNOSIS — M25562 Pain in left knee: Secondary | ICD-10-CM

## 2021-06-08 DIAGNOSIS — M25561 Pain in right knee: Secondary | ICD-10-CM

## 2021-06-08 DIAGNOSIS — G8929 Other chronic pain: Secondary | ICD-10-CM

## 2021-06-08 DIAGNOSIS — M545 Low back pain, unspecified: Secondary | ICD-10-CM

## 2021-06-08 DIAGNOSIS — M25552 Pain in left hip: Secondary | ICD-10-CM

## 2021-06-08 NOTE — Therapy (Signed)
?OUTPATIENT PHYSICAL THERAPY TREATMENT  ? ? ?Patient Name: Brett [name] ?MRN: [id number] ?DOB:Oct 11, 1975, 46 y.o., male ?Today's Date: 06/08/2021 ? ? ? ? ? ? ? ? ?Past Medical History:  ?Diagnosis Date  ? Asthma   ? Bipolar 1 disorder ([place]) 06/15/2016  ? Previously followed by Psychiatry. Hx of "anger issues." Hx of medication use has included Seroquel and Ativan, both of which have helped him in the past.   ? Chronic pain of both knees 06/15/2016  ? Chronic seasonal allergic rhinitis due to pollen 06/15/2016  ? Depression   ? Gastroesophageal reflux disease without esophagitis 06/15/2016  ? History of diabetes mellitus 05/17/2019  ? Resolved after > 100 pound weight loss and lifestyle changes. 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   ? Hypertension   ? Insomnia 06/15/2016  ? Left rotator cuff tear arthropathy 06/15/2016  ? s/p repair and PT. No ongoing issues.  ? Morbid obesity ([place]) 06/15/2016  ? ?Past Surgical History:  ?Procedure Laterality Date  ? BICEPS TENDON REPAIR    ? PANNICULECTOMY  11/15/2018  ? loose skin removal surgery  ? ?Patient Active Problem List  ? Diagnosis Date Noted  ? Scalp ringworm 04/23/2021  ? Somatic dysfunction of spine, lumbar 12/04/2020  ? Vitamin B12 deficiency 05/17/2019  ? History of diabetes mellitus 05/17/2019  ? Attention deficit hyperactivity disorder (ADHD), combined type 09/27/2017  ? Unilateral primary osteoarthritis, left knee 07/10/2017  ? Spondylolisthesis, lumbosacral region 07/10/2017  ? Chronic midline low back pain without sciatica 07/10/2017  ? Hypogonadism in male 05/20/2017  ? Bipolar 1 disorder ([place]) 06/15/2016  ? Left rotator cuff tear arthropathy, s/p PT and repair 06/15/2016  ? Chronic seasonal allergic rhinitis due to pollen 06/15/2016  ? Peripheral polyneuropathy 06/15/2016  ? Insomnia 06/15/2016  ? Moderate persistent asthma   ? ? ?PCP: [name], MD ? ?REFERRING PROVIDER: [name], MD ? ?REFERRING DIAG:  ? ?THERAPY DIAG:  ?No  diagnosis found. ? ?ONSET DATE:  ? ?SUBJECTIVE:                                                                                                                                                                                          ? ?SUBJECTIVE STATEMENT: ?Pt states less soreness today. He has been sick for a few days, so he did not exercise much. Overall feeling less stiff in AM. ? ? ?PERTINENT HISTORY:  none  ? ? ?PAIN:  ?Are you having pain? Yes ?NPRS scale: 2/10, up to 8/10 ?Pain location: L knee  ?Pain orientation: Left> Right  ?PAIN TYPE: aching ?Pain description: intermittent  ?Aggravating factors:  Kneeling,  increased flexion, squat  ?Relieving factors: none stated  ? ?Are you having pain? Yes ?NPRS scale:   5/10 ?Pain location: Back ?Pain orientation: Right and Left  ?PAIN TYPE: aching ?Pain description: intermittent  ?Aggravating factors: Prolonged sitting or standing, fwd bend, lift,  ?Relieving factors: none stated  ? ?PRECAUTIONS: None ? ?WEIGHT BEARING RESTRICTIONS No ? ?FALLS:  ?Has patient fallen in last 6 months? No, Number of falls: 0 ? ? 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 ?PLOF: Independent ? ?PATIENT GOALS  : decreased pain in L hip, back, and knees, improve hip mobility and knee mobility for jujutsu.  ? ? ?OBJECTIVE:  ? ?DIAGNOSTIC FINDINGS:  ?Lumbar X-ray: 1. Lumbar disc and posterior element degeneration L2-L3 through ?L5-S1, with grade 1 spondylolisthesis up to 9 mm at the latter. ?Small caudal midline disc extrusion at L3-L4. ?2. Capacious underlying spinal canal, no significant lumbar spinal ?stenosis. Up to mild bilateral lateral recess stenosis at the L4 ?nerve levels. And moderate bilateral L5 neural foraminal stenosis ? ?Hip X-ray:    Mild bilateral hip osteoarthritis. ?Knee X-ray:  Tricompartmental DJD without acute findings. ? ? ?COGNITION: ?Overall cognitive status: Within functional limits for tasks assessed   ? ?PALPATION: ?Hypomobile L patella,  decreased medial glide ?Mod limitation for bil quad length  ?Hypomobile L hip, increased stiffness and pain with flexion and IR ?Pain in L hip, glute med, min, and into ITB,  ?Soreness in bil low back, SI , into bil glutes  ? ? ?AROM/PROM ?05/11/21: ?Lumbar: WFL, pain with L and R SB  ?Hips: limited L hip flex, IR, ER and ext, painful flex and IR;  R WFL ?Knees: WFL in supine, limited in prone due to quad tightness.  ? ?MMT:  ?05/11/21: ?Hip flex: 4+/5, Abd: 4/5, Ext: 4/5 ?Knees: 5/5 ? ? ?LUMBAR SPECIAL TESTS:  ?SLR: neg;  + FADIR on L;  ? ? ? ?TODAY'S TREATMENT ? ?06/08/21: ?Therapeutic Exercise: ?Aerobic:   Recumbent bike: L5  x 8 min;  ?Supine: Superman 20 sec x 4;  ?S/L:  Side plank/knees 30 sec x 3; with top leg abd x 2;  ?Seated:  ?Standing: fwd lumbar flexion for HSS and lumbar mobility x 5; Band walks Blue TB x 20;  ?Stretches:  Supine fig 4 30 sec x 2 bil; HSS with strap, hip IR x 10; Dynamic stretches: high knees, tin man 25 ft x 2 ea;  ?Neuromuscular Re-education: ?Manual Therapy:  L hip post mobs, IR stretching, HSS ? ? ?06/03/21: ?Therapeutic Exercise: ?Aerobic:   ?Supine: reviewed mechanics for superman (for HEP) ?S/L:   ?Seated:  ?Standing: fwd lumbar flexion for HSS and lumbar mobility x 5;  ?Stretches:   Supine fig 4 30 sec x 2 bil; HSS with strap, ITB with strap x2 ea bil; LTR x 5;  ?Neuromuscular Re-education: ?Manual Therapy:  Long leg distraction for back and hip on L and R; Manual L hip flexion and IR stretch, L hip post and ant mobs; Manual piriformis stretch bil; Manual L knee flexion stretch, mobilization to inc L knee flexion; Bil quad stretch in prone; Lumbar PA mobs; IASM and DTM to bil lumbar paraspinals and bil glute med/min;  ? ? ?05/31/21: ?Therapeutic Exercise: ?Aerobic:   Recumbent bike: L8  x 8 min;  ?Supine: Prone: superman, arms in T, x 10, 5 sec;  SLR x 10 bil; Bicycle with TA x 15;  ?S/L:   ?Seated:  ?Standing:  SLS 25 sec x 3 bil;  Hip hinge motion with UE flex/ext x 1 min; Dead lift  25 lb x 15; Squats 20lb x 15; Reviewed walk/march bwd for HEP.  ?Stretches:   Seated fig 4 x1 min bil;  ?Neuromuscular Re-education: ?Manual Therapy:  Long leg distraction for back and hip on L; Manual hip flexion and IR stretch, Manual knee flexion stretch, patella mobs on L;  ? ? ? ?PATIENT EDUCATION:  ?Education details:  reviewed HEP ?Person educated: Patient ?Education method: Explanation, Demonstration, Tactile cues, Verbal cues, and Handouts ?Education comprehension: verbalized understanding, returned demonstration, verbal cues required, tactile cues required, and needs further education ? ? ?HOME EXERCISE PROGRAM: ?Access Code: [email or web address] ? ? ? ?ASSESSMENT: ? ?CLINICAL IMPRESSION: ?06/08/21:   Pt did well with strengthening today. Challenged due to weakness with back and hip strengthening, will benefit from continued work on this, for posterior chain.  ? ? ?Objective impairments include Abnormal gait, decreased activity tolerance, decreased endurance, decreased mobility, decreased ROM, decreased strength, decreased safety awareness, hypomobility, increased muscle spasms, impaired flexibility, improper body mechanics, and pain. These impairments are limiting patient from cleaning, community activity, driving, yard work, and shopping. Personal factors including Time since onset of injury/illness/exacerbation are also affecting patient's functional outcome. Patient will benefit from skilled PT to address above impairments and improve overall function. ? ?REHAB POTENTIAL: Good ? ?CLINICAL DECISION MAKING: Stable/uncomplicated ? ?EVALUATION COMPLEXITY: Low ? ? ?GOALS: ?Goals reviewed with patient? Yes ? ?SHORT TERM GOALS: ? ?STG Name Target Date Goal status  ?1 Patient will be independent with initial HEP  ? 05/25/21 INITIAL  ?2 Pt to demo improved ROM for L hip to be [facility] for flexion and IR  06/01/21 INITIAL  ?  ?    ? ?LONG TERM GOALS:  ? ?LTG Name Target Date ?8 weeks  Goal status  ?1 Patient will be independent with  final HEP ? 07/06/21 INITIAL  ?2 Pt to report decreased pain in L hip, back and knees to 0-2/10 with activity  ? 07/06/21 INITIAL  ?3 Pt to demonstrate improved strength of L hip to 5/5, to improve ability for j

## 2021-06-10 ENCOUNTER — Encounter: Payer: Self-pay | Admitting: Physical Therapy

## 2021-06-10 ENCOUNTER — Ambulatory Visit: Payer: BC Managed Care – PPO | Admitting: Physical Therapy

## 2021-06-10 DIAGNOSIS — M25552 Pain in left hip: Secondary | ICD-10-CM

## 2021-06-10 DIAGNOSIS — M25561 Pain in right knee: Secondary | ICD-10-CM

## 2021-06-10 DIAGNOSIS — M545 Low back pain, unspecified: Secondary | ICD-10-CM

## 2021-06-10 DIAGNOSIS — G8929 Other chronic pain: Secondary | ICD-10-CM

## 2021-06-10 DIAGNOSIS — M25562 Pain in left knee: Secondary | ICD-10-CM

## 2021-06-10 NOTE — Therapy (Signed)
?OUTPATIENT PHYSICAL THERAPY TREATMENT  ? ? ?Patient Name: Brett [name] ?MRN: [id number] ?DOB:Jan 26, 1976, 46 y.o., male ?Today's Date: 06/10/2021 ? ? PT End of Session - 06/10/21 1335   ? ? Visit Number [id number]   ? Number of Visits 16   ? Date for PT Re-Evaluation 07/06/21   ? Authorization Type [employer]BS   ? PT Start Time [phone]   ? PT Stop Time 0931   ? PT Time Calculation (min) 41 min   ? Activity Tolerance Patient tolerated treatment well   ? Behavior During Therapy [facility] for tasks assessed/performed   ? ?  ?  ? ?  ? ? ? ? ? ? ? ?Past Medical History:  ?Diagnosis Date  ? Asthma   ? Bipolar 1 disorder ([place]) 06/15/2016  ? Previously followed by Psychiatry. Hx of "anger issues." Hx of medication use has included Seroquel and Ativan, both of which have helped him in the past.   ? Chronic pain of both knees 06/15/2016  ? Chronic seasonal allergic rhinitis due to pollen 06/15/2016  ? Depression   ? Gastroesophageal reflux disease without esophagitis 06/15/2016  ? History of diabetes mellitus 05/17/2019  ? Resolved after > 100 pound weight loss and lifestyle changes. 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   ? Hypertension   ? Insomnia 06/15/2016  ? Left rotator cuff tear arthropathy 06/15/2016  ? s/p repair and PT. No ongoing issues.  ? Morbid obesity ([place]) 06/15/2016  ? ?Past Surgical History:  ?Procedure Laterality Date  ? BICEPS TENDON REPAIR    ? PANNICULECTOMY  11/15/2018  ? loose skin removal surgery  ? ?Patient Active Problem List  ? Diagnosis Date Noted  ? Scalp ringworm 04/23/2021  ? Somatic dysfunction of spine, lumbar 12/04/2020  ? Vitamin B12 deficiency 05/17/2019  ? History of diabetes mellitus 05/17/2019  ? Attention deficit hyperactivity disorder (ADHD), combined type 09/27/2017  ? Unilateral primary osteoarthritis, left knee 07/10/2017  ? Spondylolisthesis, lumbosacral region 07/10/2017  ? Chronic midline low back pain without sciatica 07/10/2017  ? Hypogonadism in male 05/20/2017  ? Bipolar 1  disorder ([place]) 06/15/2016  ? Left rotator cuff tear arthropathy, s/p PT and repair 06/15/2016  ? Chronic seasonal allergic rhinitis due to pollen 06/15/2016  ? Peripheral polyneuropathy 06/15/2016  ? Insomnia 06/15/2016  ? Moderate persistent asthma   ? ? ?PCP: [name], MD ? ?REFERRING PROVIDER: [name], MD ? ?REFERRING DIAG:  ? ?THERAPY DIAG:  ?Chronic bilateral low back pain without sciatica ? ?Pain in left hip ? ?Chronic pain of left knee ? ?Chronic pain of right knee ? ?ONSET DATE:  ? ?SUBJECTIVE:                                                                                                                                                                                          ? ?  SUBJECTIVE STATEMENT: ?Pt states some muscle soreness from exercise yesterday. Still has stiffness in hips that is not improving much. Back has not been feeling better with regular activities.  ? ? ?PERTINENT HISTORY:  none  ? ? ?PAIN:  ?Are you having pain? Yes ?NPRS scale: 2/10, up to 8/10 ?Pain location: L knee  ?Pain orientation: Left> Right  ?PAIN TYPE: aching ?Pain description: intermittent  ?Aggravating factors:  Kneeling, increased flexion, squat  ?Relieving factors: none stated  ? ?Are you having pain? Yes ?NPRS scale:   5/10 ?Pain location: Back ?Pain orientation: Right and Left  ?PAIN TYPE: aching ?Pain description: intermittent  ?Aggravating factors: Prolonged sitting or standing, fwd bend, lift,  ?Relieving factors: none stated  ? ?PRECAUTIONS: None ? ?WEIGHT BEARING RESTRICTIONS No ? ?FALLS:  ?Has patient fallen in last 6 months? No, Number of falls: 0 ? ? 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 ?PLOF: Independent ? ?PATIENT GOALS  : decreased pain in L hip, back, and knees, improve hip mobility and knee mobility for jujutsu.  ? ? ?OBJECTIVE:  ? ?DIAGNOSTIC FINDINGS:  ?Lumbar X-ray: 1. Lumbar disc and posterior element degeneration L2-L3 through ?L5-S1, with grade 1 spondylolisthesis  up to 9 mm at the latter. ?Small caudal midline disc extrusion at L3-L4. ?2. Capacious underlying spinal canal, no significant lumbar spinal ?stenosis. Up to mild bilateral lateral recess stenosis at the L4 ?nerve levels. And moderate bilateral L5 neural foraminal stenosis ? ?Hip X-ray:    Mild bilateral hip osteoarthritis. ?Knee X-ray:  Tricompartmental DJD without acute findings. ? ? ?COGNITION: ?Overall cognitive status: Within functional limits for tasks assessed   ? ?PALPATION: ?Hypomobile L patella, decreased medial glide ?Mod limitation for bil quad length  ?Hypomobile L hip, increased stiffness and pain with flexion and IR ?Pain in L hip, glute med, min, and into ITB,  ?Soreness in bil low back, SI , into bil glutes  ? ? ?AROM/PROM ?05/11/21: ?Lumbar: WFL, pain with L and R SB  ?Hips: limited L hip flex, IR, ER and ext, painful flex and IR;  R WFL ?Knees: WFL in supine, limited in prone due to quad tightness.  ? ?MMT:  ?05/11/21: ?Hip flex: 4+/5, Abd: 4/5, Ext: 4/5 ?Knees: 5/5 ? ? ?LUMBAR SPECIAL TESTS:  ?SLR: neg;  + FADIR on L;  ? ? ? ?TODAY'S TREATMENT ?06/10/21: ?Therapeutic Exercise: ?Aerobic:   Recumbent bike: L5  x 6 min;  ?Supine:  ?S/L:  Side plank/knees 30 sec x 3 bil;  ?Seated:  ?Standing:  Hip abd RTB x 20 bil; SL hip hinge/modified RDL x 10 bil; with UE support x 10 bil;  ?Stretches:  Seated HSS 30 sec x 3 bil; Seated fig 4 30 sec x 2 bil; Manually assisted TFL and hip flexor stretches-thomas test position;  ?Neuromuscular Re-education: ?Manual Therapy:  R and L hip post mobs, IR stretching bil; Hip flexion and ER ROM;  ? ? ?06/08/21: ?Therapeutic Exercise: ?Aerobic:   Recumbent bike: L5  x 8 min;  ?Supine: Superman 20 sec x 4;  ?S/L:  Side plank/knees 30 sec x 3; with top leg abd x 2;  ?Seated:  ?Standing: fwd lumbar flexion for HSS and lumbar mobility x 5; Band walks Blue TB x 20;  ?Stretches:  Supine fig 4 30 sec x 2 bil; HSS with strap, hip IR x 10; Dynamic stretches: high knees, tin man 25 ft x  2 ea;  ?Neuromuscular Re-education: ?Manual Therapy:  L hip post mobs, IR stretching,  HSS ? ? ? ?PATIENT EDUCATION:  ?Education details:  reviewed HEP ?Person educated: Patient ?Education method: Explanation, Demonstration, Tactile cues, Verbal cues, and Handouts ?Education comprehension: verbalized understanding, returned demonstration, verbal cues required, tactile cues required, and needs further education ? ? ?HOME EXERCISE PROGRAM: ?Access Code: [email or web address] ? ? ? ?ASSESSMENT: ? ?CLINICAL IMPRESSION: ?06/10/21:  Pt with good ability for strengthening and activities today. He continues to have hip stiffness. Added thomas test position for stretching hip flexor and TFL today. Pt very challenged with side planks due to weakness, and SL RDL due to weakness and instability. He also is getting increased pain on R hip with SLS and SL activity. Pt to benefit from progressive strengthening and mobility for hips, back and LEs.  ? ? ?Objective impairments include Abnormal gait, decreased activity tolerance, decreased endurance, decreased mobility, decreased ROM, decreased strength, decreased safety awareness, hypomobility, increased muscle spasms, impaired flexibility, improper body mechanics, and pain. These impairments are limiting patient from cleaning, community activity, driving, yard work, and shopping. Personal factors including Time since onset of injury/illness/exacerbation are also affecting patient's functional outcome. Patient will benefit from skilled PT to address above impairments and improve overall function. ? ?REHAB POTENTIAL: Good ? ?CLINICAL DECISION MAKING: Stable/uncomplicated ? ?EVALUATION COMPLEXITY: Low ? ? ?GOALS: ?Goals reviewed with patient? Yes ? ?SHORT TERM GOALS: ? ?STG Name Target Date Goal status  ?1 Patient will be independent with initial HEP  ? 05/25/21 INITIAL  ?2 Pt to demo improved ROM for L hip to be [facility] for flexion and IR  06/01/21 INITIAL  ?  ?    ? ?LONG TERM GOALS:  ? ?LTG Name Target  Date ?8 weeks  Goal status  ?1 Patient will be independent with final HEP ? 07/06/21 INITIAL  ?2 Pt to report decreased pain in L hip, back and knees to 0-2/10 with activity  ? 07/06/21 INITIAL  ?3 Pt to demon

## 2021-06-14 ENCOUNTER — Encounter: Payer: Self-pay | Admitting: Physical Therapy

## 2021-06-14 ENCOUNTER — Ambulatory Visit: Payer: BC Managed Care – PPO | Admitting: Physical Therapy

## 2021-06-14 ENCOUNTER — Telehealth: Payer: Self-pay | Admitting: Family Medicine

## 2021-06-14 DIAGNOSIS — G8929 Other chronic pain: Secondary | ICD-10-CM

## 2021-06-14 DIAGNOSIS — M545 Low back pain, unspecified: Secondary | ICD-10-CM

## 2021-06-14 DIAGNOSIS — M25562 Pain in left knee: Secondary | ICD-10-CM

## 2021-06-14 DIAGNOSIS — M25561 Pain in right knee: Secondary | ICD-10-CM

## 2021-06-14 DIAGNOSIS — M25552 Pain in left hip: Secondary | ICD-10-CM | POA: Diagnosis not present

## 2021-06-14 NOTE — Therapy (Signed)
?OUTPATIENT PHYSICAL THERAPY TREATMENT  ? ? ?Patient Name: Brett [name] ?MRN: [id number] ?DOB:[birth year], 46 y.o., male ?Today's Date: 06/14/2021 ? ? PT End of Session - 06/14/21 0806   ? ? Visit Number 11   ? Number of Visits 16   ? Date for PT Re-Evaluation 07/06/21   ? Authorization Type BCBS   ? PT Start Time [phone]   ? PT Stop Time 0845   ? PT Time Calculation (min) 41 min   ? Activity Tolerance Patient tolerated treatment well   ? Behavior During Therapy [facility] for tasks assessed/performed   ? ?  ?  ? ?  ? ? ? ? ? ? ? ? ?Past Medical History:  ?Diagnosis Date  ? Asthma   ? Bipolar 1 disorder ([place]) 06/15/2016  ? Previously followed by Psychiatry. Hx of "anger issues." Hx of medication use has included Seroquel and Ativan, both of which have helped him in the past.   ? Chronic pain of both knees 06/15/2016  ? Chronic seasonal allergic rhinitis due to pollen 06/15/2016  ? Depression   ? Gastroesophageal reflux disease without esophagitis 06/15/2016  ? History of diabetes mellitus 05/17/2019  ? Resolved after > 100 pound weight loss and lifestyle changes. 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   ? Hypertension   ? Insomnia 06/15/2016  ? Left rotator cuff tear arthropathy 06/15/2016  ? s/p repair and PT. No ongoing issues.  ? Morbid obesity ([place]) 06/15/2016  ? ?Past Surgical History:  ?Procedure Laterality Date  ? BICEPS TENDON REPAIR    ? PANNICULECTOMY  11/15/2018  ? loose skin removal surgery  ? ?Patient Active Problem List  ? Diagnosis Date Noted  ? Scalp ringworm 04/23/2021  ? Somatic dysfunction of spine, lumbar 12/04/2020  ? Vitamin B12 deficiency 05/17/2019  ? History of diabetes mellitus 05/17/2019  ? Attention deficit hyperactivity disorder (ADHD), combined type 09/27/2017  ? Unilateral primary osteoarthritis, left knee 07/10/2017  ? Spondylolisthesis, lumbosacral region 07/10/2017  ? Chronic midline low back pain without sciatica 07/10/2017  ? Hypogonadism in male 05/20/2017  ? Bipolar 1  disorder ([place]) 06/15/2016  ? Left rotator cuff tear arthropathy, s/p PT and repair 06/15/2016  ? Chronic seasonal allergic rhinitis due to pollen 06/15/2016  ? Peripheral polyneuropathy 06/15/2016  ? Insomnia 06/15/2016  ? Moderate persistent asthma   ? ? ?PCP: [name], MD ? ?REFERRING PROVIDER: [name], MD ? ?REFERRING DIAG:  ? ?THERAPY DIAG:  ?Chronic bilateral low back pain without sciatica ? ?Pain in left hip ? ?Chronic pain of left knee ? ?Chronic pain of right knee ? ?ONSET DATE:  ? ?SUBJECTIVE:                                                                                                                                                                                          ? ?  SUBJECTIVE STATEMENT: ?Pt was able to do some strength training in the last few days, did well with leg strengthening at gym. Does have slight pain in back today.  ? ?PERTINENT HISTORY:  none  ? ? ?PAIN:  ?Are you having pain? Yes ?NPRS scale: 2/10, up to 8/10 ?Pain location: L knee  ?Pain orientation: Left> Right  ?PAIN TYPE: aching ?Pain description: intermittent  ?Aggravating factors:  Kneeling, increased flexion, squat  ?Relieving factors: none stated  ? ?Are you having pain? Yes ?NPRS scale:   5/10 ?Pain location: Back ?Pain orientation: Right and Left  ?PAIN TYPE: aching ?Pain description: intermittent  ?Aggravating factors: Prolonged sitting or standing, fwd bend, lift,  ?Relieving factors: none stated  ? ?PRECAUTIONS: None ? ?WEIGHT BEARING RESTRICTIONS No ? ?FALLS:  ?Has patient fallen in last 6 months? No, Number of falls: 0 ? ? 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 ?PLOF: Independent ? ?PATIENT GOALS  : decreased pain in L hip, back, and knees, improve hip mobility and knee mobility for jujutsu.  ? ? ?OBJECTIVE:  ? ?DIAGNOSTIC FINDINGS:  ?Lumbar X-ray: 1. Lumbar disc and posterior element degeneration L2-L3 through ?L5-S1, with grade 1 spondylolisthesis up to 9 mm at the latter. ?Small  caudal midline disc extrusion at L3-L4. ?2. Capacious underlying spinal canal, no significant lumbar spinal ?stenosis. Up to mild bilateral lateral recess stenosis at the L4 ?nerve levels. And moderate bilateral L5 neural foraminal stenosis ? ?Hip X-ray:    Mild bilateral hip osteoarthritis. ?Knee X-ray:  Tricompartmental DJD without acute findings. ? ? ?COGNITION: ?Overall cognitive status: Within functional limits for tasks assessed   ? ?PALPATION: ?Hypomobile L patella, decreased medial glide ?Mod limitation for bil quad length  ?Hypomobile L hip, increased stiffness and pain with flexion and IR ?Pain in L hip, glute med, min, and into ITB,  ?Soreness in bil low back, SI , into bil glutes  ? ? ?AROM/PROM ?05/11/21: ?Lumbar: WFL, pain with L and R SB  ?Hips: limited L hip flex, IR, ER and ext, painful flex and IR;  R WFL ?Knees: WFL in supine, limited in prone due to quad tightness.  ? ?MMT:  ?05/11/21: ?Hip flex: 4+/5, Abd: 4/5, Ext: 4/5 ?Knees: 5/5 ? ? ?LUMBAR SPECIAL TESTS:  ?SLR: neg;  + FADIR on L;  ? ? ? ?TODAY'S TREATMENT ? ?06/14/21: ?Therapeutic Exercise: ?Aerobic:  Recumbent bike: L5 x 8 min;  ?Supine: bridging Black TB x 20; Prone superman 20 sec x 5;  ?S/L:  Side plank/knees 30 sec x 3 bil;  ?Seated:  ?Standing:  Lateral step ups 12 in x 15 bil; Hip abd RTB x 20 bil; SL hip hinge/modified RDL  with UE support x 10 bil;  ?Stretches: Seated HSS 30 sec x 3 bil; Seated fig 4 30 sec x 2 bil; Hip flexor off side of table x 1 min bil;   ?Neuromuscular Re-education: ?Manual Therapy:   IR stretching bil; Hip flexion and knee flexion PROM; Hip flexor and quad stretches prone;   ? ? ? ?06/10/21: ?Therapeutic Exercise: ?Aerobic:   Recumbent bike: L5  x 6 min;  ?Supine:  ?S/L:  Side plank/knees 30 sec x 3 bil;  ?Seated:  ?Standing:  Hip abd RTB x 20 bil; SL hip hinge/modified RDL x 10 bil; with UE support x 10 bil;  ?Stretches:  Seated HSS 30 sec x 3 bil; Seated fig 4 30 sec x 2 bil; Manually assisted TFL and hip  flexor stretches-thomas test position;  ?  Neuromuscular Re-education: ?Manual Therapy:  R and L hip post mobs, IR stretching bil; Hip flexion and ER ROM;  ? ? ? ?PATIENT EDUCATION:  ?Education details:  reviewed HEP ?Person educated: Patient ?Education method: Explanation, Demonstration, Tactile cues, Verbal cues, and Handouts ?Education comprehension: verbalized understanding, returned demonstration, verbal cues required, tactile cues required, and needs further education ? ? ?HOME EXERCISE PROGRAM: ?Access Code: [email or web address] ? ? ? ?ASSESSMENT: ? ?CLINICAL IMPRESSION: ?06/14/21:   ?Pt improving with hip and posterior chain strength and ability for Ther ex. He continues to have difficulty with hip ROM, inc pain with IR, and pain in hip with SLS. Discussed continued strengthening for hips, knee, core, and posterior chain.  ? ? ?Objective impairments include Abnormal gait, decreased activity tolerance, decreased endurance, decreased mobility, decreased ROM, decreased strength, decreased safety awareness, hypomobility, increased muscle spasms, impaired flexibility, improper body mechanics, and pain. These impairments are limiting patient from cleaning, community activity, driving, yard work, and shopping. Personal factors including Time since onset of injury/illness/exacerbation are also affecting patient's functional outcome. Patient will benefit from skilled PT to address above impairments and improve overall function. ? ?REHAB POTENTIAL: Good ? ?CLINICAL DECISION MAKING: Stable/uncomplicated ? ?EVALUATION COMPLEXITY: Low ? ? ?GOALS: ?Goals reviewed with patient? Yes ? ?SHORT TERM GOALS: ? ?STG Name Target Date Goal status  ?1 Patient will be independent with initial HEP  ? 05/25/21 INITIAL  ?2 Pt to demo improved ROM for L hip to be [facility] for flexion and IR  06/01/21 INITIAL  ?  ?    ? ?LONG TERM GOALS:  ? ?LTG Name Target Date ?8 weeks  Goal status  ?1 Patient will be independent with final HEP ? 07/06/21 INITIAL  ?2 Pt to  report decreased pain in L hip, back and knees to 0-2/10 with activity  ? 07/06/21 INITIAL  ?3 Pt to demonstrate improved strength of L hip to 5/5, to improve ability for jujutsu, and hip abduction motion without

## 2021-06-14 NOTE — Telephone Encounter (Signed)
FYI

## 2021-06-14 NOTE — Telephone Encounter (Signed)
Patient would like to transfer care from Dr. Jonni Sanger to Dr Cherlynn Kaiser.  ?

## 2021-06-14 NOTE — Telephone Encounter (Signed)
Please see note below. 

## 2021-06-15 NOTE — Telephone Encounter (Signed)
Please see note and advise  

## 2021-06-15 NOTE — Telephone Encounter (Signed)
Patient would like to transfer care to Dr. [name] from Dr. Jonni Sanger due to scheduling difficulties and not being able to get in with primary care.  ?

## 2021-06-16 ENCOUNTER — Ambulatory Visit: Payer: BC Managed Care – PPO | Admitting: Physical Therapy

## 2021-06-16 DIAGNOSIS — M545 Low back pain, unspecified: Secondary | ICD-10-CM

## 2021-06-16 DIAGNOSIS — M25562 Pain in left knee: Secondary | ICD-10-CM

## 2021-06-16 DIAGNOSIS — M25552 Pain in left hip: Secondary | ICD-10-CM | POA: Diagnosis not present

## 2021-06-16 DIAGNOSIS — M25561 Pain in right knee: Secondary | ICD-10-CM | POA: Diagnosis not present

## 2021-06-16 DIAGNOSIS — G8929 Other chronic pain: Secondary | ICD-10-CM

## 2021-06-16 NOTE — Telephone Encounter (Signed)
Ok to schedule TOC with Dr [name] ?

## 2021-06-16 NOTE — Telephone Encounter (Signed)
FYI: Pt will schedule with [name]. ?

## 2021-06-16 NOTE — Therapy (Signed)
?OUTPATIENT PHYSICAL THERAPY TREATMENT  ? ? ?Patient Name: Brett [name] ?MRN: [id number] ?DOB:[birth year], 46 y.o., male ?Today's Date: 06/16/2021 ? ? PT End of Session - 06/17/21 1037   ? ? Visit Number 12   ? Number of Visits 16   ? Date for PT Re-Evaluation 07/06/21   ? Authorization Type BCBS   ? PT Start Time 0802   ? PT Stop Time 0845   ? PT Time Calculation (min) 43 min   ? Activity Tolerance Patient tolerated treatment well   ? Behavior During Therapy [facility] for tasks assessed/performed   ? ?  ?  ? ?  ? ? ? ? ? ? ? ? ? ?Past Medical History:  ?Diagnosis Date  ? Asthma   ? Bipolar 1 disorder ([place]) 06/15/2016  ? Previously followed by Psychiatry. Hx of "anger issues." Hx of medication use has included Seroquel and Ativan, both of which have helped him in the past.   ? Chronic pain of both knees 06/15/2016  ? Chronic seasonal allergic rhinitis due to pollen 06/15/2016  ? Depression   ? Gastroesophageal reflux disease without esophagitis 06/15/2016  ? History of diabetes mellitus 05/17/2019  ? Resolved after > 100 pound weight loss and lifestyle changes. 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   ? Hypertension   ? Insomnia 06/15/2016  ? Left rotator cuff tear arthropathy 06/15/2016  ? s/p repair and PT. No ongoing issues.  ? Morbid obesity ([place]) 06/15/2016  ? ?Past Surgical History:  ?Procedure Laterality Date  ? BICEPS TENDON REPAIR    ? PANNICULECTOMY  11/15/2018  ? loose skin removal surgery  ? ?Patient Active Problem List  ? Diagnosis Date Noted  ? Scalp ringworm 04/23/2021  ? Somatic dysfunction of spine, lumbar 12/04/2020  ? Vitamin B12 deficiency 05/17/2019  ? History of diabetes mellitus 05/17/2019  ? Attention deficit hyperactivity disorder (ADHD), combined type 09/27/2017  ? Unilateral primary osteoarthritis, left knee 07/10/2017  ? Spondylolisthesis, lumbosacral region 07/10/2017  ? Chronic midline low back pain without sciatica 07/10/2017  ? Hypogonadism in male 05/20/2017  ? Bipolar 1  disorder ([place]) 06/15/2016  ? Left rotator cuff tear arthropathy, s/p PT and repair 06/15/2016  ? Chronic seasonal allergic rhinitis due to pollen 06/15/2016  ? Peripheral polyneuropathy 06/15/2016  ? Insomnia 06/15/2016  ? Moderate persistent asthma   ? ? ?PCP: [name], MD ? ?REFERRING PROVIDER: [name], MD ? ?REFERRING DIAG:  ? ?THERAPY DIAG:  ?Chronic bilateral low back pain without sciatica ? ?Pain in left hip ? ?Chronic pain of left knee ? ?Chronic pain of right knee ? ?ONSET DATE:  ? ?SUBJECTIVE:                                                                                                                                                                                          ? ?  SUBJECTIVE STATEMENT: ?Pt was able to do strength training at gym, did well with this. Minimal L knee pain, mild pain in R knee. Minimal pain in back overall. Still sore in R glute/SI.  ? ? ?PERTINENT HISTORY:  none  ? ? ?PAIN:  ?Are you having pain? Yes ?NPRS scale: 2/10, up to 8/10 ?Pain location: L knee  ?Pain orientation: Left> Right  ?PAIN TYPE: aching ?Pain description: intermittent  ?Aggravating factors:  Kneeling, increased flexion, squat  ?Relieving factors: none stated  ? ?Are you having pain? Yes ?NPRS scale:   5/10 ?Pain location: Back ?Pain orientation: Right and Left  ?PAIN TYPE: aching ?Pain description: intermittent  ?Aggravating factors: Prolonged sitting or standing, fwd bend, lift,  ?Relieving factors: none stated  ? ?PRECAUTIONS: None ? ?WEIGHT BEARING RESTRICTIONS No ? ?FALLS:  ?Has patient fallen in last 6 months? No, Number of falls: 0 ? ? 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 ?PLOF: Independent ? ?PATIENT GOALS  : decreased pain in L hip, back, and knees, improve hip mobility and knee mobility for jujutsu.  ? ? ?OBJECTIVE:  ? ?DIAGNOSTIC FINDINGS:  ?Lumbar X-ray: 1. Lumbar disc and posterior element degeneration L2-L3 through ?L5-S1, with grade 1 spondylolisthesis up to 9  mm at the latter. ?Small caudal midline disc extrusion at L3-L4. ?2. Capacious underlying spinal canal, no significant lumbar spinal ?stenosis. Up to mild bilateral lateral recess stenosis at the L4 ?nerve levels. And moderate bilateral L5 neural foraminal stenosis ? ?Hip X-ray:    Mild bilateral hip osteoarthritis. ?Knee X-ray:  Tricompartmental DJD without acute findings. ? ? ?COGNITION: ?Overall cognitive status: Within functional limits for tasks assessed   ? ?PALPATION: ?Hypomobile L patella, decreased medial glide ?Mod limitation for bil quad length  ?Hypomobile L hip, increased stiffness and pain with flexion and IR ?Pain in L hip, glute med, min, and into ITB,  ?Soreness in bil low back, SI , into bil glutes  ? ? ?AROM/PROM ?05/11/21: ?Lumbar: WFL, pain with L and R SB  ?Hips: limited L hip flex, IR, ER and ext, painful flex and IR;  R WFL ?Knees: WFL in supine, limited in prone due to quad tightness.  ? ?MMT:  ?05/11/21: ?Hip flex: 4+/5, Abd: 4/5, Ext: 4/5 ?Knees: 5/5 ? ? ?LUMBAR SPECIAL TESTS:  ?SLR: neg;  + FADIR on L;  ? ? ? ?TODAY'S TREATMENT ? ?06/16/21: ?Therapeutic Exercise: ?Aerobic:  Recumbent bike: L5 x 6 min;  ?Supine:   Planks 1 min x 3;   Prone superman 20 sec x 5;  ?S/L:  Side plank/knees 30 sec x 3 bil;  ?Seated:  ?Standing:  Hip abd RTB x 20 bil; SL hip hinge/modified RDL with UE support x 10 bil; Band walks black TB 25 ft x 8;  ?Stretches:  Standing QL stretch 30 sec x 2 bil; Seated fig 4 30 sec x 2 bil; Hip flexor off side of table x 1 min bil; Supine shoulder ER stretch 90/90 x 2 min;   ?Neuromuscular Re-education: ?Manual Therapy:    Hip flexion and knee flexion PROM; Hip inf and lateral mobs with strap bil; Lumbar PA mobs, R SI mobs;  ?Trigger Point Dry-Needling  ?Treatment instructions: Expect mild to moderate muscle soreness. S/S of pneumothorax if dry needled over a lung field, and to seek immediate medical attention should they occur. Patient verbalized understanding of these  instructions and education. ? ?Patient Consent Given: Yes ?Education handout provided: Previously provided ?Muscles treated: R Glute min and  med ?Electrical stimulation performed: No ?Parameters: N/A ?Treatment response/outcome: Twitch response, and palpable increase in muscle length. ? ? ?06/14/21: ?Therapeutic Exercise: ?Aerobic:  Recumbent bike: L5 x 8 min;  ?Supine: bridging Black TB x 20; Prone superman 20 sec x 5;  ?S/L:  Side plank/knees 30 sec x 3 bil;  ?Seated:  ?Standing:  Lateral step ups 12 in x 15 bil; Hip abd RTB x 20 bil; SL hip hinge/modified RDL  with UE support x 10 bil;  ?Stretches: Seated HSS 30 sec x 3 bil; Seated fig 4 30 sec x 2 bil; Hip flexor off side of table x 1 min bil;   ?Neuromuscular Re-education: ?Manual Therapy:   IR stretching bil; Hip flexion and knee flexion PROM; Hip flexor and quad stretches prone;   ? ? ? ?06/10/21: ?Therapeutic Exercise: ?Aerobic:   Recumbent bike: L5  x 6 min;  ?Supine:  ?S/L:  Side plank/knees 30 sec x 3 bil;  ?Seated:  ?Standing:  Hip abd RTB x 20 bil; SL hip hinge/modified RDL x 10 bil; with UE support x 10 bil;  ?Stretches:  Seated HSS 30 sec x 3 bil; Seated fig 4 30 sec x 2 bil; Manually assisted TFL and hip flexor stretches-thomas test position;  ?Neuromuscular Re-education: ?Manual Therapy:  R and L hip post mobs, IR stretching bil; Hip flexion and ER ROM;  ? ? ? ?PATIENT EDUCATION:  ?Education details:  reviewed HEP ?Person educated: Patient ?Education method: Explanation, Demonstration, Tactile cues, Verbal cues, and Handouts ?Education comprehension: verbalized understanding, returned demonstration, verbal cues required, tactile cues required, and needs further education ? ? ?HOME EXERCISE PROGRAM: ?Access Code: [email or web address] ? ? ? ?ASSESSMENT: ? ?CLINICAL IMPRESSION: ?06/17/21:   ?Pt improving with ability for Ther ex. He continues to have pain in L hip with IR.  Will benefit from continued strengthening for hips, knee, core, and posterior chain. Likely  work towards d/c in next couple weeks, if pain levels still decreasing.  ? ? ?Objective impairments include Abnormal gait, decreased activity tolerance, decreased endurance, decreased mobility, decreased ROM, decre

## 2021-06-16 NOTE — Telephone Encounter (Signed)
Pena with me. ? ?Algis Greenhouse. [name], MD ?06/16/2021 8:03 AM  ? ?

## 2021-06-17 ENCOUNTER — Encounter: Payer: Self-pay | Admitting: Physical Therapy

## 2021-06-17 NOTE — Telephone Encounter (Signed)
Pt is scheduled for a TOC Monday 06/21/2021 ?

## 2021-06-18 ENCOUNTER — Other Ambulatory Visit: Payer: BC Managed Care – PPO

## 2021-06-18 DIAGNOSIS — E291 Testicular hypofunction: Secondary | ICD-10-CM

## 2021-06-19 ENCOUNTER — Other Ambulatory Visit: Payer: Self-pay | Admitting: Family Medicine

## 2021-06-19 DIAGNOSIS — F3181 Bipolar II disorder: Secondary | ICD-10-CM

## 2021-06-21 ENCOUNTER — Ambulatory Visit (INDEPENDENT_AMBULATORY_CARE_PROVIDER_SITE_OTHER): Payer: BC Managed Care – PPO | Admitting: Family Medicine

## 2021-06-21 ENCOUNTER — Encounter: Payer: Self-pay | Admitting: Family Medicine

## 2021-06-21 ENCOUNTER — Other Ambulatory Visit: Payer: Self-pay | Admitting: Family Medicine

## 2021-06-21 VITALS — BP 135/90 | HR 58 | Temp 98.2°F | Ht 71.0 in | Wt 214.6 lb

## 2021-06-21 DIAGNOSIS — G8929 Other chronic pain: Secondary | ICD-10-CM

## 2021-06-21 DIAGNOSIS — F902 Attention-deficit hyperactivity disorder, combined type: Secondary | ICD-10-CM | POA: Diagnosis not present

## 2021-06-21 DIAGNOSIS — G629 Polyneuropathy, unspecified: Secondary | ICD-10-CM | POA: Diagnosis not present

## 2021-06-21 DIAGNOSIS — N529 Male erectile dysfunction, unspecified: Secondary | ICD-10-CM | POA: Insufficient documentation

## 2021-06-21 DIAGNOSIS — F5101 Primary insomnia: Secondary | ICD-10-CM

## 2021-06-21 DIAGNOSIS — F39 Unspecified mood [affective] disorder: Secondary | ICD-10-CM

## 2021-06-21 DIAGNOSIS — E291 Testicular hypofunction: Secondary | ICD-10-CM | POA: Diagnosis not present

## 2021-06-21 DIAGNOSIS — M545 Low back pain, unspecified: Secondary | ICD-10-CM

## 2021-06-21 MED ORDER — TADALAFIL 20 MG PO TABS: 10.0000 mg | ORAL_TABLET | ORAL | 11 refills | Status: DC | PRN

## 2021-06-21 NOTE — Telephone Encounter (Signed)
Can we check with patient? viagra was not effective for him in the past and we discussed trying cialis. HE good try goodrx if his insurance will not pay for it. ? ?Brett [name]. [name], MD ?06/21/2021 12:30 PM  ? ?

## 2021-06-21 NOTE — Assessment & Plan Note (Signed)
Overall mood has been stable.  He has been on Wellbutrin 300 mg daily and has been tolerating well.  We will continue.  Follow-up in 3 to 6 months. ?

## 2021-06-21 NOTE — Assessment & Plan Note (Signed)
On Lyrica 200 mg twice daily.  Thought to be sequela of diabetic neuropathy. ?

## 2021-06-21 NOTE — Assessment & Plan Note (Signed)
On testosterone replacement.  Last testosterone levels at goal.  We will continue 300 mg total monthly.  We briefly discussed switching to transcutaneous formulation however we will continue with current dose for now.  Follow-up in 3 to 6 months. ?

## 2021-06-21 NOTE — Assessment & Plan Note (Signed)
He has previously been on Viagra previously on which he modestly well though he did not feel like it was adequately helping with his symptoms.  No reported side effects.  We discussed alternatives.  We will trial Cialis.  Discussed side effects.  He will follow-up with me in a few weeks via MyChart. ?

## 2021-06-21 NOTE — Assessment & Plan Note (Signed)
Database reviewed without any red flags.  He is on Vyvanse 60 mg daily.  Medications help with ability to stay focused and on task.  He does not need refill today.  Follow-up in 3 to 6 months. ?

## 2021-06-21 NOTE — Patient Instructions (Signed)
It was very nice to see you today! ? ?Please try the Cialis. ? ?Please keep up the good work with your diet and exercise. ? ?Please come back in 6 months for annual physical.  Come back sooner if needed. ? ?Take care, ?Dr [name] ? ?PLEASE NOTE: ? ?If you had any lab tests please let us know if you have not heard back within a few days. You may see your results on mychart before we have a chance to review them but we will give you a call once they are reviewed by [place]. If we ordered any referrals today, please let us know if you have not heard from their office within the next week.  ? ?Please try these tips to maintain a healthy lifestyle: ? ?Eat at least 3 REAL meals and 1-2 snacks per day.  Aim for no more than 5 hours between eating.  If you eat breakfast, please do so within one hour of getting up.  ? ?Each meal should contain half fruits/vegetables, one quarter protein, and one quarter carbs (no bigger than a computer mouse) ? ?Cut down on sweet beverages. This includes juice, soda, and sweet tea.  ? ?Drink at least 1 glass of water with each meal and aim for at least 8 glasses per day ? ?Exercise at least 150 minutes every week.   ?

## 2021-06-21 NOTE — Assessment & Plan Note (Signed)
Follows with sports medicine.  He has recently received epidural steroid injections which is done well. ?

## 2021-06-21 NOTE — Progress Notes (Signed)
? ?Brett [name] is a 46 y.o. male who presents today for an office visit. ? ?Assessment/Plan:  ?Chronic Problems Addressed Today: ?Erectile dysfunction ?He has previously been on Viagra previously on which he modestly well though he did not feel like it was adequately helping with his symptoms.  No reported side effects.  We discussed alternatives.  We will trial Cialis.  Discussed side effects.  He will follow-up with me in a few weeks via MyChart. ? ?Attention deficit hyperactivity disorder (ADHD), combined type ?Database reviewed without any red flags.  He is on Vyvanse 60 mg daily.  Medications help with ability to stay focused and on task.  He does not need refill today.  Follow-up in 3 to 6 months. ? ?Chronic midline low back pain without sciatica ?Follows with sports medicine.  He has recently received epidural steroid injections which is done well. ? ?Hypogonadism in male ?On testosterone replacement.  Last testosterone levels at goal.  We will continue 300 mg total monthly.  We briefly discussed switching to transcutaneous formulation however we will continue with current dose for now.  Follow-up in 3 to 6 months. ? ?Insomnia ?On trazodone 50 mg nightly.  Symptoms are still not adequately controlled.  Uses melatonin as needed as well.  He has been on Ambien in the past which he did not tolerate.  We will continue current regimen for now though if this continues to be an issue would consider trial of additional medication such as doxepin, amitriptyline, or Seroquel. ? ?Peripheral polyneuropathy ?On Lyrica 200 mg twice daily.  Thought to be sequela of diabetic neuropathy. ? ?Mood disorder ([place]) ?Overall mood has been stable.  He has been on Wellbutrin 300 mg daily and has been tolerating well.  We will continue.  Follow-up in 3 to 6 months. ? ? ?  ?Subjective:  ?HPI: ? ?Please see A/P for status of chronic conditions.  He is transferring care to [place] for primary care.  ? ?He has no acute concerns  today.  ? ?ROS: Per HPI, otherwise a complete review of systems was negative.  ? ?PMH: ? ?The following were reviewed and entered/updated in epic: ?Past Medical History:  ?Diagnosis Date  ? Asthma   ? Bipolar 1 disorder ([place]) 06/15/2016  ? Previously followed by Psychiatry. Hx of "anger issues." Hx of medication use has included Seroquel and Ativan, both of which have helped him in the past.   ? Chronic pain of both knees 06/15/2016  ? Chronic seasonal allergic rhinitis due to pollen 06/15/2016  ? Depression   ? Gastroesophageal reflux disease without esophagitis 06/15/2016  ? History of diabetes mellitus 05/17/2019  ? Resolved after > 100 pound weight loss and lifestyle changes. 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   ? Hypertension   ? Insomnia 06/15/2016  ? Left rotator cuff tear arthropathy 06/15/2016  ? s/p repair and PT. No ongoing issues.  ? Morbid obesity ([place]) 06/15/2016  ? ?Patient Active Problem List  ? Diagnosis Date Noted  ? Erectile dysfunction 06/21/2021  ? Somatic dysfunction of spine, lumbar 12/04/2020  ? Vitamin B12 deficiency 05/17/2019  ? History of diabetes mellitus 05/17/2019  ? Attention deficit hyperactivity disorder (ADHD), combined type 09/27/2017  ? Unilateral primary osteoarthritis, left knee 07/10/2017  ? Spondylolisthesis, lumbosacral region 07/10/2017  ? Chronic midline low back pain without sciatica 07/10/2017  ? Hypogonadism in male 05/20/2017  ? Mood disorder ([place]) 06/15/2016  ? Chronic seasonal allergic rhinitis due to pollen 06/15/2016  ? Peripheral  polyneuropathy 06/15/2016  ? Insomnia 06/15/2016  ? Moderate persistent asthma   ? ?Past Surgical History:  ?Procedure Laterality Date  ? BICEPS TENDON REPAIR    ? PANNICULECTOMY  11/15/2018  ? loose skin removal surgery  ? ? ?History reviewed. No pertinent family history. ? ?Medications- reviewed and updated ?Current Outpatient Medications  ?Medication Sig Dispense Refill  ? albuterol (VENTOLIN HFA) 108 (90 Base) MCG/ACT  inhaler INHALE 2 PUFFS BY MOUTH EVERY 6 HOURS AS NEEDED FOR WHEEZE OR SHORTNESS OF BREATH 18 each 3  ? buPROPion (WELLBUTRIN XL) 300 MG 24 hr tablet Take 1 tablet (300 mg total) by mouth daily. 90 tablet 3  ? celecoxib (CELEBREX) 100 MG capsule TAKE 1 CAPSULE BY MOUTH TWICE A DAY AS NEEDED 180 capsule 1  ? Insulin Pen Needle 32G X 4 MM MISC Use 1 needle per day as directed 30 each 3  ? lisdexamfetamine (VYVANSE) 60 MG capsule Take 1 capsule (60 mg total) by mouth every morning. 30 capsule 0  ? lisdexamfetamine (VYVANSE) 60 MG capsule Take 1 capsule (60 mg total) by mouth every morning. 30 capsule 0  ? lisdexamfetamine (VYVANSE) 60 MG capsule Take 1 capsule (60 mg total) by mouth every morning. 30 capsule 0  ? MAGNESIUM CITRATE PO Take 1 tablet by mouth every evening.    ? methocarbamol (ROBAXIN) 750 MG tablet TAKE 1 TABLET (750 MG TOTAL) BY MOUTH 2 (TWO) TIMES DAILY AS NEEDED FOR MUSCLE SPASMS. 90 tablet 1  ? methocarbamol (ROBAXIN) 750 MG tablet Take 1 tablet (750 mg total) by mouth 2 (two) times daily as needed for muscle spasms. 90 tablet 1  ? pregabalin (LYRICA) 200 MG capsule TAKE 1 CAPSULE BY MOUTH TWICE A DAY 180 capsule 1  ? tadalafil (CIALIS) 20 MG tablet Take 0.5-1 tablets (10-20 mg total) by mouth every other day as needed for erectile dysfunction. 10 tablet 11  ? testosterone cypionate (DEPOTESTOSTERONE CYPIONATE) 200 MG/ML injection Alternate '100mg'$  SQ and '200mg'$  SQ every 14 days. ('300mg'$  total dose / month) 10 mL 1  ? traZODone (DESYREL) 50 MG tablet Take 1 tablet (50 mg total) by mouth at bedtime as needed for sleep. 90 tablet 3  ? ?No current facility-administered medications for this visit.  ? ? ?Allergies-reviewed and updated ?No Known Allergies ? ?Social History  ? ?Socioeconomic History  ? Marital status: Married  ?  Spouse name: Not on file  ? Number of children: 2  ? Years of education: Not on file  ? Highest education level: Not on file  ?Occupational History  ? Occupation: Engineer, site  ?  Employer: [employer]  ?Tobacco Use  ? Smoking status: Never  ? Smokeless tobacco: Never  ?Substance and Sexual Activity  ? Alcohol use: No  ? Drug use: No  ? Sexual activity: Yes  ?Other Topics Concern  ? Not on file  ?Social History Narrative  ? Lives with wife and two children, all patients of [facility]. Wants to get back to weight lifting. Lives near [place] in [place].  ? ?Social Determinants of Health  ? ?Financial Resource Strain: Not on file  ?Food Insecurity: Not on file  ?Transportation Needs: Not on file  ?Physical Activity: Not on file  ?Stress: Not on file  ?Social Connections: Not on file  ? ? ? ?   ?  ?Objective:  ?Physical Exam: ?BP 135/90 (BP Location: Left Arm)   Pulse (!) 58   Temp 98.2 ?F (36.8 ?C) (Temporal)  Ht '5\' 11"'$  (1.803 m)   Wt 214 lb 9.6 oz (97.3 kg)   SpO2 95%   BMI 29.93 kg/m?   ?Gen: No acute distress, resting comfortably ?CV: Regular rate and rhythm with no murmurs appreciated ?Pulm: Normal work of breathing, clear to auscultation bilaterally with no crackles, wheezes, or rhonchi ?Neuro: Grossly normal, moves all extremities ?Psych: Normal affect and thought content ? ?   ? ? ?Time Spent: ?45 minutes of total time was spent on the date of the encounter performing the following actions: chart review prior to seeing the patient including recent visits with previous PCPs, obtaining history, performing a medically necessary exam, counseling on the treatment plan, placing orders, and documenting in our EHR.  ? ?Brett [name]. [name], MD ?06/21/2021 10:21 AM  ? ?

## 2021-06-21 NOTE — Assessment & Plan Note (Signed)
On trazodone 50 mg nightly.  Symptoms are still not adequately controlled.  Uses melatonin as needed as well.  He has been on Ambien in the past which he did not tolerate.  We will continue current regimen for now though if this continues to be an issue would consider trial of additional medication such as doxepin, amitriptyline, or Seroquel. ?

## 2021-06-22 ENCOUNTER — Ambulatory Visit: Payer: BC Managed Care – PPO | Admitting: Physical Therapy

## 2021-06-22 ENCOUNTER — Encounter: Payer: Self-pay | Admitting: Physical Therapy

## 2021-06-22 DIAGNOSIS — M25561 Pain in right knee: Secondary | ICD-10-CM | POA: Diagnosis not present

## 2021-06-22 DIAGNOSIS — M545 Low back pain, unspecified: Secondary | ICD-10-CM | POA: Diagnosis not present

## 2021-06-22 DIAGNOSIS — M25562 Pain in left knee: Secondary | ICD-10-CM

## 2021-06-22 DIAGNOSIS — M25552 Pain in left hip: Secondary | ICD-10-CM | POA: Diagnosis not present

## 2021-06-22 DIAGNOSIS — G8929 Other chronic pain: Secondary | ICD-10-CM

## 2021-06-22 NOTE — Therapy (Signed)
?OUTPATIENT PHYSICAL THERAPY TREATMENT  ? ? ?Patient Name: Brett [name] ?MRN: [id number] ?DOB:September 21, 1975, 46 y.o., male ?Today's Date: 06/16/2021 ? ? PT End of Session - 06/22/21 1305   ? ? Visit Number 13   ? Number of Visits 16   ? Date for PT Re-Evaluation 07/06/21   ? Authorization Type BCBS   ? PT Start Time 0805   ? PT Stop Time 3664   ? PT Time Calculation (min) 41 min   ? Activity Tolerance Patient tolerated treatment well   ? Behavior During Therapy [facility] for tasks assessed/performed   ? ?  ?  ? ?  ? ? ? ? ? ? ? ? ? ? ?Past Medical History:  ?Diagnosis Date  ? Asthma   ? Bipolar 1 disorder ([place]) 06/15/2016  ? Previously followed by Psychiatry. Hx of "anger issues." Hx of medication use has included Seroquel and Ativan, both of which have helped him in the past.   ? Chronic pain of both knees 06/15/2016  ? Chronic seasonal allergic rhinitis due to pollen 06/15/2016  ? Depression   ? Gastroesophageal reflux disease without esophagitis 06/15/2016  ? History of diabetes mellitus 05/17/2019  ? Resolved after > 100 pound weight loss and lifestyle changes. 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   ? Hypertension   ? Insomnia 06/15/2016  ? Left rotator cuff tear arthropathy 06/15/2016  ? s/p repair and PT. No ongoing issues.  ? Morbid obesity ([place]) 06/15/2016  ? ?Past Surgical History:  ?Procedure Laterality Date  ? BICEPS TENDON REPAIR    ? PANNICULECTOMY  11/15/2018  ? loose skin removal surgery  ? ?Patient Active Problem List  ? Diagnosis Date Noted  ? Erectile dysfunction 06/21/2021  ? Somatic dysfunction of spine, lumbar 12/04/2020  ? Vitamin B12 deficiency 05/17/2019  ? History of diabetes mellitus 05/17/2019  ? Attention deficit hyperactivity disorder (ADHD), combined type 09/27/2017  ? Unilateral primary osteoarthritis, left knee 07/10/2017  ? Spondylolisthesis, lumbosacral region 07/10/2017  ? Chronic midline low back pain without sciatica 07/10/2017  ? Hypogonadism in male 05/20/2017  ? Mood  disorder ([place]) 06/15/2016  ? Chronic seasonal allergic rhinitis due to pollen 06/15/2016  ? Peripheral polyneuropathy 06/15/2016  ? Insomnia 06/15/2016  ? Moderate persistent asthma   ? ? ?PCP: Vivi Barrack, MD ? ?REFERRING PROVIDER: [name], MD ? ?REFERRING DIAG:  ? ?THERAPY DIAG:  ?Chronic bilateral low back pain without sciatica ? ?Pain in left hip ? ?Chronic pain of left knee ? ?Chronic pain of right knee ? ?ONSET DATE:  ? ?SUBJECTIVE:                                                                                                                                                                                          ? ?  SUBJECTIVE STATEMENT: ?Pt states less pain in knees and back. Hips still stiff. He does have muscle soreness today from workout/training yesterday.  ? ?PERTINENT HISTORY:  none  ? ?PAIN:  ?Are you having pain? Yes ?NPRS scale: 2/10, up to 8/10 ?Pain location: L knee  ?Pain orientation: Left> Right  ?PAIN TYPE: aching ?Pain description: intermittent  ?Aggravating factors:  Kneeling, increased flexion, squat  ?Relieving factors: none stated  ? ?Are you having pain? Yes ?NPRS scale:   5/10 ?Pain location: Back ?Pain orientation: Right and Left  ?PAIN TYPE: aching ?Pain description: intermittent  ?Aggravating factors: Prolonged sitting or standing, fwd bend, lift,  ?Relieving factors: none stated  ? ?PRECAUTIONS: None ? ?WEIGHT BEARING RESTRICTIONS No ? ?FALLS:  ?Has patient fallen in last 6 months? No, Number of falls: 0 ? ? 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 ?PLOF: Independent ? ?PATIENT GOALS  : decreased pain in L hip, back, and knees, improve hip mobility and knee mobility for jujutsu.  ? ? ?OBJECTIVE:  ? ?DIAGNOSTIC FINDINGS:  ?Lumbar X-ray: 1. Lumbar disc and posterior element degeneration L2-L3 through ?L5-S1, with grade 1 spondylolisthesis up to 9 mm at the latter. ?Small caudal midline disc extrusion at L3-L4. ?2. Capacious underlying spinal canal, no  significant lumbar spinal ?stenosis. Up to mild bilateral lateral recess stenosis at the L4 ?nerve levels. And moderate bilateral L5 neural foraminal stenosis ? ?Hip X-ray:    Mild bilateral hip osteoarthritis. ?Knee X-ray:  Tricompartmental DJD without acute findings. ? ? ?COGNITION: ?Overall cognitive status: Within functional limits for tasks assessed   ? ?PALPATION: ?Hypomobile L patella, decreased medial glide ?Mod limitation for bil quad length  ?Hypomobile L hip, increased stiffness and pain with flexion and IR ?Pain in L hip, glute med, min, and into ITB,  ?Soreness in bil low back, SI , into bil glutes  ? ? ?AROM/PROM ?06/22/21 ?Lumbar: WFL, mild pain with L and R SB  ?Hips: painful IR bil; mild limitation for ER  ?Knees: WFL in supine, limited in prone due to quad tightness.  ? ?MMT:  ?06/22/21 ?Hip flex: 4+/5, Abd: 4+/5, Ext: 4+/5 ?Knees: 5/5 ? ? ?LUMBAR SPECIAL TESTS:  ?SLR: neg;  + FADIR on L;  ? ? ?TODAY'S TREATMENT ?06/22/21: ?Therapeutic Exercise: ?Aerobic:  Recumbent bike: L5 x 6 min;  ?Supine:    ?S/L:   ?Seated:  ?Standing:   ?Stretches:  Standing QL stretch 30 sec x 2 bil; Seated fig 4 30 sec x 2 bil; Hip flexor off side of table x 1 min bil; Kneeling Hip flexor stretch 30 sec x 3; prone quad and hip flexor stretches;  ?Neuromuscular Re-education: ?Manual Therapy:    Hip flexion and knee flexion PROM; Hip inf and lateral mobs with strap bil; Manual stretch for bil hip flexors  ? ? ?06/16/21: ?Therapeutic Exercise: ?Aerobic:  Recumbent bike: L5 x 6 min;  ?Supine:   Planks 1 min x 3;   Prone superman 20 sec x 5;  ?S/L:  Side plank/knees 30 sec x 3 bil;  ?Seated:  ?Standing:  Hip abd RTB x 20 bil; SL hip hinge/modified RDL with UE support x 10 bil; Band walks black TB 25 ft x 8;  ?Stretches:  Standing QL stretch 30 sec x 2 bil; Seated fig 4 30 sec x 2 bil; Hip flexor off side of table x 1 min bil; Supine shoulder ER stretch 90/90 x 2 min;   ?Neuromuscular Re-education: ?Manual Therapy:    Hip flexion  and knee flexion PROM; Hip inf and lateral mobs with strap bil; Lumbar PA mobs, R SI mobs;  ?Trigger Point Dry-Needling  ?Treatment instructions: Expect mild to moderate muscle soreness. S/S of pneumothorax if dry needled over a lung field, and to seek immediate medical attention should they occur. Patient verbalized understanding of these instructions and education. ? ?Patient Consent Given: Yes ?Education handout provided: Previously provided ?Muscles treated: R Glute min and med ?Electrical stimulation performed: No ?Parameters: N/A ?Treatment response/outcome: Twitch response, and palpable increase in muscle length. ? ? ? ? ?PATIENT EDUCATION:  ?Education details:  reviewed HEP ?Person educated: Patient ?Education method: Explanation, Demonstration, Tactile cues, Verbal cues, and Handouts ?Education comprehension: verbalized understanding, returned demonstration, verbal cues required, tactile cues required, and needs further education ? ? ?HOME EXERCISE PROGRAM: ?Access Code: [email or web address] ? ? ? ?ASSESSMENT: ? ?CLINICAL IMPRESSION: ?06/22/21: ?Pt with improving pain and ability for mobility. He is overall doing better with knee pain, and hip strengthening. He does continue to have some limitations in hip mobility for IR. He is doing well with HEP and strengthening. Plan to review final HEP next visit, and make d/c plan.  ? ? ?Objective impairments include Abnormal gait, decreased activity tolerance, decreased endurance, decreased mobility, decreased ROM, decreased strength, decreased safety awareness, hypomobility, increased muscle spasms, impaired flexibility, improper body mechanics, and pain. These impairments are limiting patient from cleaning, community activity, driving, yard work, and shopping. Personal factors including Time since onset of injury/illness/exacerbation are also affecting patient's functional outcome. Patient will benefit from skilled PT to address above impairments and improve overall  function. ? ?REHAB POTENTIAL: Good ? ?CLINICAL DECISION MAKING: Stable/uncomplicated ? ?EVALUATION COMPLEXITY: Low ? ? ?GOALS: ?Goals reviewed with patient? Yes ? ?SHORT TERM GOALS: ? ?STG Name Target Date Goal sta

## 2021-06-23 ENCOUNTER — Other Ambulatory Visit: Payer: Self-pay | Admitting: Family Medicine

## 2021-06-23 DIAGNOSIS — G629 Polyneuropathy, unspecified: Secondary | ICD-10-CM

## 2021-06-24 ENCOUNTER — Encounter: Payer: Self-pay | Admitting: Physical Therapy

## 2021-06-24 ENCOUNTER — Ambulatory Visit: Payer: BC Managed Care – PPO | Admitting: Physical Therapy

## 2021-06-24 ENCOUNTER — Other Ambulatory Visit: Payer: Self-pay | Admitting: Family Medicine

## 2021-06-24 DIAGNOSIS — M25561 Pain in right knee: Secondary | ICD-10-CM | POA: Diagnosis not present

## 2021-06-24 DIAGNOSIS — M545 Low back pain, unspecified: Secondary | ICD-10-CM | POA: Diagnosis not present

## 2021-06-24 DIAGNOSIS — M25562 Pain in left knee: Secondary | ICD-10-CM

## 2021-06-24 DIAGNOSIS — G629 Polyneuropathy, unspecified: Secondary | ICD-10-CM

## 2021-06-24 DIAGNOSIS — M25552 Pain in left hip: Secondary | ICD-10-CM

## 2021-06-24 DIAGNOSIS — G8929 Other chronic pain: Secondary | ICD-10-CM

## 2021-06-24 LAB — TESTOSTERONE,FREE AND TOTAL
Testosterone, Free: 20.2 pg/mL (ref 6.8–21.5)
Testosterone: 791 ng/dL (ref 264–916)

## 2021-06-24 MED ORDER — PREGABALIN 200 MG PO CAPS: 200.0000 mg | ORAL_CAPSULE | Freq: Two times a day (BID) | ORAL | 1 refills | Status: DC

## 2021-06-24 NOTE — Progress Notes (Signed)
We discussed testosterone at his last office visit. ? ?Algis Greenhouse. [name], MD ?06/24/2021 11:51 AM  ?

## 2021-06-24 NOTE — Telephone Encounter (Signed)
PT REQUESTING A REFILL ? ?PHARMACY: ?[id number] , [place] [address]? ?pregabalin (LYRICA) 200 MG capsule ?

## 2021-06-24 NOTE — Therapy (Signed)
?OUTPATIENT PHYSICAL THERAPY TREATMENT  ? ? ?Patient Name: Brett [name] ?MRN: [id number] ?DOB:11/30/1975, 46 y.o., male ?Today's Date: 06/16/2021 ? ? PT End of Session - 06/24/21 1321   ? ? Visit Number 14   ? Number of Visits 16   ? Date for PT Re-Evaluation 07/06/21   ? Authorization Type BCBS   ? PT Start Time [phone]   ? PT Stop Time 0930   ? PT Time Calculation (min) 43 min   ? Activity Tolerance Patient tolerated treatment well   ? Behavior During Therapy [facility] for tasks assessed/performed   ? ?  ?  ? ?  ? ? ? ? ? ? ? ? ? ? ? ?Past Medical History:  ?Diagnosis Date  ? Asthma   ? Bipolar 1 disorder ([place]) 06/15/2016  ? Previously followed by Psychiatry. Hx of "anger issues." Hx of medication use has included Seroquel and Ativan, both of which have helped him in the past.   ? Chronic pain of both knees 06/15/2016  ? Chronic seasonal allergic rhinitis due to pollen 06/15/2016  ? Depression   ? Gastroesophageal reflux disease without esophagitis 06/15/2016  ? History of diabetes mellitus 05/17/2019  ? Resolved after > 100 pound weight loss and lifestyle changes. 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   ? Hypertension   ? Insomnia 06/15/2016  ? Left rotator cuff tear arthropathy 06/15/2016  ? s/p repair and PT. No ongoing issues.  ? Morbid obesity ([place]) 06/15/2016  ? ?Past Surgical History:  ?Procedure Laterality Date  ? BICEPS TENDON REPAIR    ? PANNICULECTOMY  11/15/2018  ? loose skin removal surgery  ? ?Patient Active Problem List  ? Diagnosis Date Noted  ? Erectile dysfunction 06/21/2021  ? Somatic dysfunction of spine, lumbar 12/04/2020  ? Vitamin B12 deficiency 05/17/2019  ? History of diabetes mellitus 05/17/2019  ? Attention deficit hyperactivity disorder (ADHD), combined type 09/27/2017  ? Unilateral primary osteoarthritis, left knee 07/10/2017  ? Spondylolisthesis, lumbosacral region 07/10/2017  ? Chronic midline low back pain without sciatica 07/10/2017  ? Hypogonadism in male 05/20/2017  ?  Mood disorder ([place]) 06/15/2016  ? Chronic seasonal allergic rhinitis due to pollen 06/15/2016  ? Peripheral polyneuropathy 06/15/2016  ? Insomnia 06/15/2016  ? Moderate persistent asthma   ? ? ?PCP: Vivi Barrack, MD ? ?REFERRING PROVIDER: [name], MD ? ?REFERRING DIAG:  ? ?THERAPY DIAG:  ?Chronic bilateral low back pain without sciatica ? ?Pain in left hip ? ?Chronic pain of left knee ? ?Chronic pain of right knee ? ?ONSET DATE:  ? ?SUBJECTIVE:                                                                                                                                                                                          ? ?  SUBJECTIVE STATEMENT: ?Pt with muscle soreness today from workout/training yesterday, mostly in glutes  ? ?PERTINENT HISTORY:  none  ? ?PAIN:  ?Are you having pain? Yes ?NPRS scale: 2/10, up to 8/10 ?Pain location: L knee  ?Pain orientation: Left> Right  ?PAIN TYPE: aching ?Pain description: intermittent  ?Aggravating factors:  Kneeling, increased flexion, squat  ?Relieving factors: none stated  ? ?Are you having pain? Yes ?NPRS scale:   5/10 ?Pain location: Back ?Pain orientation: Right and Left  ?PAIN TYPE: aching ?Pain description: intermittent  ?Aggravating factors: Prolonged sitting or standing, fwd bend, lift,  ?Relieving factors: none stated  ? ?PRECAUTIONS: None ? ?WEIGHT BEARING RESTRICTIONS No ? ?FALLS:  ?Has patient fallen in last 6 months? No, Number of falls: 0 ? ? 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 ?PLOF: Independent ? ?PATIENT GOALS  : decreased pain in L hip, back, and knees, improve hip mobility and knee mobility for jujutsu.  ? ? ?OBJECTIVE:  ? ?DIAGNOSTIC FINDINGS:  ?Lumbar X-ray: 1. Lumbar disc and posterior element degeneration L2-L3 through ?L5-S1, with grade 1 spondylolisthesis up to 9 mm at the latter. ?Small caudal midline disc extrusion at L3-L4. ?2. Capacious underlying spinal canal, no significant lumbar spinal ?stenosis. Up to  mild bilateral lateral recess stenosis at the L4 ?nerve levels. And moderate bilateral L5 neural foraminal stenosis ? ?Hip X-ray:    Mild bilateral hip osteoarthritis. ?Knee X-ray:  Tricompartmental DJD without acute findings. ? ? ?COGNITION: ?Overall cognitive status: Within functional limits for tasks assessed   ? ?PALPATION: ?Hypomobile L patella, decreased medial glide ?Mod limitation for bil quad length  ?Hypomobile L hip, increased stiffness and pain with flexion and IR ?Pain in L hip, glute med, min, and into ITB,  ?Soreness in bil low back, SI , into bil glutes  ? ? ?AROM/PROM ?06/22/21 ?Lumbar: WFL, mild pain with L and R SB  ?Hips: painful IR bil; mild limitation for ER  ?Knees: WFL in supine, limited in prone due to quad tightness.  ? ?MMT:  ?06/22/21 ?Hip flex: 4+/5, Abd: 4+/5, Ext: 4+/5 ?Knees: 5/5 ? ? ?LUMBAR SPECIAL TESTS:  ?SLR: neg;  + FADIR on L;  ? ? ?TODAY'S TREATMENT ? ?06/24/21 ?Therapeutic Exercise: ?Aerobic:   ?Supine:    ?S/L:  Side plank 1 min x 2 bil;  ?Seated:  ?Standing: deadlift 45 lb x 25;  ?Stretches: LTR x 10; Seated QL stretch 30 sec x 3; Seated lumbar fwd flexion w SB x 3 on L;  ?Neuromuscular Re-education: ?Manual Therapy: Lumbar PA and SI mobs;  ?Self Care:   Updated and reviewed final HEP in detail, discussed safe/recommended exercise at this time, and things to avoid.  ? ?06/22/21: ?Therapeutic Exercise: ?Aerobic:  Recumbent bike: L5 x 6 min;  ?Supine:    ?S/L:   ?Seated:  ?Standing:   ?Stretches:  Standing QL stretch 30 sec x 2 bil; Seated fig 4 30 sec x 2 bil; Hip flexor off side of table x 1 min bil; Kneeling Hip flexor stretch 30 sec x 3; prone quad and hip flexor stretches;  ?Neuromuscular Re-education: ?Manual Therapy:    Hip flexion and knee flexion PROM; Hip inf and lateral mobs with strap bil; Manual stretch for bil hip flexors  ? ? ? ?PATIENT EDUCATION:  ?Education details:  reviewed HEP ?Person educated: Patient ?Education method: Explanation, Demonstration, Tactile  cues, Verbal cues, and Handouts ?Education comprehension: verbalized understanding, returned demonstration, verbal cues required, tactile cues required, and needs further education ? ? ?  HOME EXERCISE PROGRAM: ?Access Code: [email or web address] ? ? ? ?ASSESSMENT: ? ?CLINICAL IMPRESSION: ?06/24/21: Reviewed final HEP today. Pt will be seen next week, likely d/c next week. He is doing much better with ability for ther ex,and hip and core strengthening. He continues to have mild soreness in R SI at times. Recommended frequent mobility at home for stiffness and likely OA in several joints, as well as continued strengthening for hips , LEs and core.   ? ? ?Objective impairments include Abnormal gait, decreased activity tolerance, decreased endurance, decreased mobility, decreased ROM, decreased strength, decreased safety awareness, hypomobility, increased muscle spasms, impaired flexibility, improper body mechanics, and pain. These impairments are limiting patient from cleaning, community activity, driving, yard work, and shopping. Personal factors including Time since onset of injury/illness/exacerbation are also affecting patient's functional outcome. Patient will benefit from skilled PT to address above impairments and improve overall function. ? ?REHAB POTENTIAL: Good ? ?CLINICAL DECISION MAKING: Stable/uncomplicated ? ?EVALUATION COMPLEXITY: Low ? ? ?GOALS: ?Goals reviewed with patient? Yes ? ?SHORT TERM GOALS: ? ?STG Name Target Date Goal status  ?1 Patient will be independent with initial HEP  ? 05/25/21 MET  ?2 Pt to demo improved ROM for L hip to be [facility] for flexion and IR  06/01/21 Partially met- pain   ?  ?    ? ?LONG TERM GOALS:  ? ?LTG Name Target Date ?8 weeks  Goal status  ?1 Patient will be independent with final HEP ? 07/06/21 Partially Met  ?2 Pt to report decreased pain in L hip, back and knees to 0-2/10 with activity  ? 07/06/21 Partially Met  ?3 Pt to demonstrate improved strength of L hip to 5/5, to improve ability for  jujutsu, and hip abduction motion without pain ? 07/06/21 Partially Met  ?4 Pt to demonstrate optimal mechanics and ability for bend, lift, and functional squat without pain, to improve back pain and abilit

## 2021-06-28 ENCOUNTER — Encounter: Payer: Self-pay | Admitting: Physical Therapy

## 2021-06-28 ENCOUNTER — Ambulatory Visit: Payer: BC Managed Care – PPO | Admitting: Physical Therapy

## 2021-06-28 DIAGNOSIS — G8929 Other chronic pain: Secondary | ICD-10-CM

## 2021-06-28 DIAGNOSIS — M545 Low back pain, unspecified: Secondary | ICD-10-CM

## 2021-06-28 DIAGNOSIS — M25562 Pain in left knee: Secondary | ICD-10-CM | POA: Diagnosis not present

## 2021-06-28 DIAGNOSIS — M25561 Pain in right knee: Secondary | ICD-10-CM | POA: Diagnosis not present

## 2021-06-28 DIAGNOSIS — M25552 Pain in left hip: Secondary | ICD-10-CM

## 2021-06-28 NOTE — Therapy (Signed)
?OUTPATIENT PHYSICAL THERAPY TREATMENT  ? ? ?Patient Name: Brett [name] ?MRN: [id number] ?DOB:11/24/1975, 46 y.o., male ?Today's Date: 06/16/2021 ? ? PT End of Session - 06/28/21 0807   ? ? Visit Number 15   ? Number of Visits 16   ? Date for PT Re-Evaluation 07/06/21   ? Authorization Type BCBS   ? PT Start Time 0801   ? PT Stop Time 5465   ? PT Time Calculation (min) 45 min   ? Activity Tolerance Patient tolerated treatment well   ? Behavior During Therapy [facility] for tasks assessed/performed   ? ?  ?  ? ?  ? ? ? ? ? ? ? ? ? ? ? ? ?Past Medical History:  ?Diagnosis Date  ? Asthma   ? Bipolar 1 disorder ([place]) 06/15/2016  ? Previously followed by Psychiatry. Hx of "anger issues." Hx of medication use has included Seroquel and Ativan, both of which have helped him in the past.   ? Chronic pain of both knees 06/15/2016  ? Chronic seasonal allergic rhinitis due to pollen 06/15/2016  ? Depression   ? Gastroesophageal reflux disease without esophagitis 06/15/2016  ? History of diabetes mellitus 05/17/2019  ? Resolved after > 100 pound weight loss and lifestyle changes. 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   ? Hypertension   ? Insomnia 06/15/2016  ? Left rotator cuff tear arthropathy 06/15/2016  ? s/p repair and PT. No ongoing issues.  ? Morbid obesity ([place]) 06/15/2016  ? ?Past Surgical History:  ?Procedure Laterality Date  ? BICEPS TENDON REPAIR    ? PANNICULECTOMY  11/15/2018  ? loose skin removal surgery  ? ?Patient Active Problem List  ? Diagnosis Date Noted  ? Erectile dysfunction 06/21/2021  ? Somatic dysfunction of spine, lumbar 12/04/2020  ? Vitamin B12 deficiency 05/17/2019  ? History of diabetes mellitus 05/17/2019  ? Attention deficit hyperactivity disorder (ADHD), combined type 09/27/2017  ? Unilateral primary osteoarthritis, left knee 07/10/2017  ? Spondylolisthesis, lumbosacral region 07/10/2017  ? Chronic midline low back pain without sciatica 07/10/2017  ? Hypogonadism in male 05/20/2017  ?  Mood disorder ([place]) 06/15/2016  ? Chronic seasonal allergic rhinitis due to pollen 06/15/2016  ? Peripheral polyneuropathy 06/15/2016  ? Insomnia 06/15/2016  ? Moderate persistent asthma   ? ? ?PCP: Vivi Barrack, MD ? ?REFERRING PROVIDER: [name], MD ? ?REFERRING DIAG:  ? ?THERAPY DIAG:  ?Chronic bilateral low back pain without sciatica ? ?Pain in left hip ? ?Chronic pain of left knee ? ?Chronic pain of right knee ? ?ONSET DATE:  ? ?SUBJECTIVE:                                                                                                                                                                                          ? ?  SUBJECTIVE STATEMENT: ?Pt  states continued muscle soreness after exercising. He has had less knee and back pain. He does have very bothersome sensation of tightness and stiffness with initial activity, either in AM or also after sitting for even a short period of time. States pain has improved, but stiffness has not been significantly changed. ? ?PERTINENT HISTORY:  none  ? ?PAIN:  ?Are you having pain? Yes ?NPRS scale: 2/10, up to 4/10 ?Pain location: L knee  ?Pain orientation: Left> Right  ?PAIN TYPE: aching ?Pain description: intermittent  ?Aggravating factors:  Kneeling, increased flexion, squat  ?Relieving factors: none stated  ? ?Are you having pain? Yes ?NPRS scale:   5/10 ?Pain location: Back ?Pain orientation: Right and Left  ?PAIN TYPE: aching ?Pain description: intermittent  ?Aggravating factors: Prolonged sitting or standing, fwd bend, lift,  ?Relieving factors: none stated  ? ?PRECAUTIONS: None ? ?WEIGHT BEARING RESTRICTIONS No ? ?FALLS:  ?Has patient fallen in last 6 months? No, Number of falls: 0 ? ? 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 ?PLOF: Independent ? ?PATIENT GOALS  : decreased pain in L hip, back, and knees, improve hip mobility and knee mobility for jujutsu.  ? ? ?OBJECTIVE:  ? ?DIAGNOSTIC FINDINGS:  ?Lumbar X-ray: 1. Lumbar disc  and posterior element degeneration L2-L3 through ?L5-S1, with grade 1 spondylolisthesis up to 9 mm at the latter. ?Small caudal midline disc extrusion at L3-L4. ?2. Capacious underlying spinal canal, no significant lumbar spinal ?stenosis. Up to mild bilateral lateral recess stenosis at the L4 ?nerve levels. And moderate bilateral L5 neural foraminal stenosis ? ?Hip X-ray:    Mild bilateral hip osteoarthritis. ?Knee X-ray:  Tricompartmental DJD without acute findings. ? ? ?COGNITION: ?Overall cognitive status: Within functional limits for tasks assessed   ? ?AROM/PROM ?06/28/21 ?Lumbar: WFL, mild pain with L and R SB  ?Hips: painful IR bil;  ?Knees: WFL in supine, limited in prone due to joint stiffness  ? ?MMT:  ?06/22/21 ?Hip flex: 4+/5, Abd: 4+/5, Ext: 4+/5 ?Knees: 5/5 ? ? ?LUMBAR SPECIAL TESTS:  ?SLR: neg;  + FADIR on L;  ? ? ?TODAY'S TREATMENT ? ?06/28/21: ?Therapeutic Exercise: ?Aerobic:  L 5 x 10 min  ?Supine:    ?S/L:   ?Seated:  ?Standing: deadlift/hip hinge motion (no weight) x 20; SL RDL x 5 bil - reach to chair; x 10 bil with staggered DL stance ;  ?Stretches: LTR x 10; Seated HSS; Seated fig 4 30 sec x 2 bil; Seated lumbar fwd flexion w SB x 3 on L;  ?Neuromuscular Re-education: ?Manual Therapy: Hip distraction bil; Hip post mobs bil; manual hip flexion and IR stretching; Manual knee flexion, L knee mobs to inc flexion ;  ?Self Care:    ? ? ?PATIENT EDUCATION:  ?Education details:  reviewed HEP ?Person educated: Patient ?Education method: Explanation, Demonstration, Tactile cues, Verbal cues, and Handouts ?Education comprehension: verbalized understanding, returned demonstration, verbal cues required, tactile cues required, and needs further education ? ? ?HOME EXERCISE PROGRAM: ?Access Code: [email or web address] ? ? ? ?ASSESSMENT: ? ?CLINICAL IMPRESSION: ? ?06/28/21: Pt with improving hip strength. He is doing well with double leg strengthening in standing. Has much more difficulty with SL stance/strengthening, due to  instability and hip/HS/posterior chain strength. Unable to do SL Deadlift, but able to do with staggered stance position. Also still getting some pain in L hip/glute with this motion, with reaching and bending for IADLS. Continued to review final HEP, and best strengthening to continue. Pts  pain levels overall are doing much better, likely d/c next visit.  ? ?Objective impairments include Abnormal gait, decreased activity tolerance, decreased endurance, decreased mobility, decreased ROM, decreased strength, decreased safety awareness, hypomobility, increased muscle spasms, impaired flexibility, improper body mechanics, and pain. These impairments are limiting patient from cleaning, community activity, driving, yard work, and shopping. Personal factors including Time since onset of injury/illness/exacerbation are also affecting patient's functional outcome. Patient will benefit from skilled PT to address above impairments and improve overall function. ? ?REHAB POTENTIAL: Good ? ?CLINICAL DECISION MAKING: Stable/uncomplicated ? ?EVALUATION COMPLEXITY: Low ? ? ?GOALS: ?Goals reviewed with patient? Yes ? ?SHORT TERM GOALS: ? ?STG Name Target Date Goal status  ?1 Patient will be independent with initial HEP  ? 05/25/21 MET  ?2 Pt to demo improved ROM for L hip to be [facility] for flexion and IR  06/01/21 Partially met- pain   ?  ?    ? ?LONG TERM GOALS:  ? ?LTG Name Target Date ?8 weeks  Goal status  ?1 Patient will be independent with final HEP ? 07/06/21 Partially Met  ?2 Pt to report decreased pain in L hip, back and knees to 0-2/10 with activity  ? 07/06/21 Partially Met  ?3 Pt to demonstrate improved strength of L hip to 5/5, to improve ability for jujutsu, and hip abduction motion without pain ? 07/06/21 Partially Met  ?4 Pt to demonstrate optimal mechanics and ability for bend, lift, and functional squat without pain, to improve back pain and ability for IADLs.  ? 07/06/21 MET  ?5 Pt to demo ability for kneeling/ready position  without pain greater than 2/10 in hip and knee, to improve ability for exercise and jujutsu activities.  ? 07/06/21 Partially Met  ? ? ? ? ? ?PLAN: ?PT FREQUENCY: 2x/week ? ?PT DURATION: 8 weeks ? ?PLANNED

## 2021-06-30 ENCOUNTER — Ambulatory Visit: Payer: BC Managed Care – PPO | Admitting: Physical Therapy

## 2021-06-30 ENCOUNTER — Encounter: Payer: Self-pay | Admitting: Physical Therapy

## 2021-06-30 DIAGNOSIS — G8929 Other chronic pain: Secondary | ICD-10-CM

## 2021-06-30 DIAGNOSIS — M25561 Pain in right knee: Secondary | ICD-10-CM | POA: Diagnosis not present

## 2021-06-30 DIAGNOSIS — M545 Low back pain, unspecified: Secondary | ICD-10-CM | POA: Diagnosis not present

## 2021-06-30 DIAGNOSIS — M25552 Pain in left hip: Secondary | ICD-10-CM

## 2021-06-30 DIAGNOSIS — M25562 Pain in left knee: Secondary | ICD-10-CM

## 2021-06-30 NOTE — Therapy (Signed)
?OUTPATIENT PHYSICAL THERAPY TREATMENT  ? ? ?Patient Name: Brett [name] ?MRN: [id number] ?DOB:28-Jun-1975, 46 y.o., male ?Today's Date: 06/30/21 ? ? PT End of Session - 06/30/21 0803   ? ? Visit Number 16   ? Number of Visits 16   ? Date for PT Re-Evaluation 07/06/21   ? Authorization Type BCBS   ? PT Start Time [phone]   ? PT Stop Time 0845   ? PT Time Calculation (min) 41 min   ? Activity Tolerance Patient tolerated treatment well   ? Behavior During Therapy [facility] for tasks assessed/performed   ? ?  ?  ? ?  ? ? ? ? ? ? ? ? ? ? ? ? ?Past Medical History:  ?Diagnosis Date  ? Asthma   ? Bipolar 1 disorder ([place]) 06/15/2016  ? Previously followed by Psychiatry. Hx of "anger issues." Hx of medication use has included Seroquel and Ativan, both of which have helped him in the past.   ? Chronic pain of both knees 06/15/2016  ? Chronic seasonal allergic rhinitis due to pollen 06/15/2016  ? Depression   ? Gastroesophageal reflux disease without esophagitis 06/15/2016  ? History of diabetes mellitus 05/17/2019  ? Resolved after > 100 pound weight loss and lifestyle changes. 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   ? Hypertension   ? Insomnia 06/15/2016  ? Left rotator cuff tear arthropathy 06/15/2016  ? s/p repair and PT. No ongoing issues.  ? Morbid obesity ([place]) 06/15/2016  ? ?Past Surgical History:  ?Procedure Laterality Date  ? BICEPS TENDON REPAIR    ? PANNICULECTOMY  11/15/2018  ? loose skin removal surgery  ? ?Patient Active Problem List  ? Diagnosis Date Noted  ? Erectile dysfunction 06/21/2021  ? Somatic dysfunction of spine, lumbar 12/04/2020  ? Vitamin B12 deficiency 05/17/2019  ? History of diabetes mellitus 05/17/2019  ? Attention deficit hyperactivity disorder (ADHD), combined type 09/27/2017  ? Unilateral primary osteoarthritis, left knee 07/10/2017  ? Spondylolisthesis, lumbosacral region 07/10/2017  ? Chronic midline low back pain without sciatica 07/10/2017  ? Hypogonadism in male 05/20/2017  ? Mood  disorder ([place]) 06/15/2016  ? Chronic seasonal allergic rhinitis due to pollen 06/15/2016  ? Peripheral polyneuropathy 06/15/2016  ? Insomnia 06/15/2016  ? Moderate persistent asthma   ? ? ?PCP: Vivi Barrack, MD ? ?REFERRING PROVIDER: [name], MD ? ?REFERRING DIAG:  ? ?THERAPY DIAG:  ?Chronic bilateral low back pain without sciatica ? ?Pain in left hip ? ?Chronic pain of left knee ? ?Chronic pain of right knee ? ?ONSET DATE:  ? ?SUBJECTIVE:                                                                                                                                                                                          ? ?  SUBJECTIVE STATEMENT: ?Pt  states doing well with activity, HEP and other routine at gym.  He has had less knee and back pain. He continues to have bothersome sensation of tightness and stiffness with initial activity. States pain has improved, but stiffness has not been significantly changed. ? ?PERTINENT HISTORY:  none  ? ?PAIN:  ?Are you having pain? Yes ?NPRS scale: 2/10, up to 4/10 ?Pain location: L knee  ?Pain orientation: Left> Right  ?PAIN TYPE: aching ?Pain description: intermittent  ?Aggravating factors:  Kneeling, increased flexion, squat  ?Relieving factors: none stated  ? ?Are you having pain? Yes ?NPRS scale:   5/10 ?Pain location: Back ?Pain orientation: Right and Left  ?PAIN TYPE: aching ?Pain description: intermittent  ?Aggravating factors: Prolonged sitting or standing, fwd bend, lift,  ?Relieving factors: none stated  ? ?PRECAUTIONS: None ? ?WEIGHT BEARING RESTRICTIONS No ? ?FALLS:  ?Has patient fallen in last 6 months? No, Number of falls: 0 ? 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 ?PLOF: Independent ? ?PATIENT GOALS  : decreased pain in L hip, back, and knees, improve hip mobility and knee mobility for jujutsu.  ? ? ?OBJECTIVE:  ? ?DIAGNOSTIC FINDINGS:  ?Lumbar X-ray: 1. Lumbar disc and posterior element degeneration L2-L3 through ?L5-S1, with  grade 1 spondylolisthesis up to 9 mm at the latter. ?Small caudal midline disc extrusion at L3-L4. ?2. Capacious underlying spinal canal, no significant lumbar spinal ?stenosis. Up to mild bilateral lateral recess stenosis at the L4 ?nerve levels. And moderate bilateral L5 neural foraminal stenosis ? ?Hip X-ray:    Mild bilateral hip osteoarthritis. ?Knee X-ray:  Tricompartmental DJD without acute findings. ? ? ?COGNITION: ?Overall cognitive status: Within functional limits for tasks assessed   ? ?AROM/PROM ?06/28/21 ?Lumbar: WFL, mild pain with L and R SB  ?Hips: painful IR bil;  ?Knees: WFL in supine, limited in prone due to joint stiffness  ? ?MMT:  ?06/30/21 ?Hip flex: 5/5, Abd: 5/5, Ext: 4+/5 ?Knees: 5/5 ? ? ?LUMBAR SPECIAL TESTS:  ?SLR: neg;  + FADIR on L;  ? ? ?TODAY'S TREATMENT ? ?06/30/21: ?Therapeutic Exercise: ?Aerobic:  L 5 x 39mn  ?Supine:    ?Standing:  SL RDL 2 x 5 bil x 10 bil with staggered DL stance; Band walks Black TB 30 ft x 8;   ?Stretches: Seated fig 4 -30 sec x 2 bil; dynamic stretches: butt kicks, high knees, tinman 20 ft x 4 ea;  ?Neuromuscular Re-education: ?Manual Therapy: Hip distraction bil; Hip post mobs bil; hip inf and lat mobs with strap;  ? ? ? ?06/28/21: ?Therapeutic Exercise: ?Aerobic:  L 5 x 10 min  ?Supine:    ?S/L:   ?Seated:  ?Standing: deadlift/hip hinge motion (no weight) x 20; SL RDL x 5 bil - reach to chair; x 10 bil with staggered DL stance ;  ?Stretches: LTR x 10; Seated HSS; Seated fig 4 30 sec x 2 bil; Seated lumbar fwd flexion w SB x 3 on L;  ?Neuromuscular Re-education: ?Manual Therapy: Hip distraction bil; Hip post mobs bil; manual hip flexion and IR stretching; Manual knee flexion, L knee mobs to inc flexion ;  ?Self Care:    ? ? ?PATIENT EDUCATION:  ?Education details:  reviewed HEP ?Person educated: Patient ?Education method: Explanation, Demonstration, Tactile cues, Verbal cues, and Handouts ?Education comprehension: verbalized understanding, returned demonstration,  verbal cues required, tactile cues required, and needs further education ? ? ?HOME EXERCISE PROGRAM: ?Access Code: [email or web address]? ? ? ?ASSESSMENT: ? ?  CLINICAL IMPRESSION: ? ?06/30/21: Pt with improving strength, but will benefit from continued work on posterior chain.  Reviewed final HEP, and best strengthening to continue. Pts pain levels overall are doing much better, and is managing pain well. He does have soreness with hip IR bilaterally, that has been minimally changed with mobilization and treatment. He is doing better with varied exercise routines, including more strengthening. He will return to MD next week- will ask about continued/ongoing stiffness as well as hip pain. Pt ready for d/c at this time, pt in agreement with plan.  ? ? ?Objective impairments include Abnormal gait, decreased activity tolerance, decreased endurance, decreased mobility, decreased ROM, decreased strength, decreased safety awareness, hypomobility, increased muscle spasms, impaired flexibility, improper body mechanics, and pain. These impairments are limiting patient from cleaning, community activity, driving, yard work, and shopping. Personal factors including Time since onset of injury/illness/exacerbation are also affecting patient's functional outcome. Patient will benefit from skilled PT to address above impairments and improve overall function. ? ?REHAB POTENTIAL: Good ? ?CLINICAL DECISION MAKING: Stable/uncomplicated ? ?EVALUATION COMPLEXITY: Low ? ? ?GOALS: ?Goals reviewed with patient? Yes ? ?SHORT TERM GOALS: ? ?STG Name Target Date Goal status  ?1 Patient will be independent with initial HEP  ? 05/25/21 MET  ?2 Pt to demo improved ROM for L hip to be [facility] for flexion and IR  06/01/21 Partially met- pain   ?  ?    ? ?LONG TERM GOALS:  ? ?LTG Name Target Date ?8 weeks  Goal status  ?1 Patient will be independent with final HEP ? 07/06/21 MET  ?2 Pt to report decreased pain in L hip, back and knees to 0-2/10 with activity  ? 07/06/21  Partially met   ?3 Pt to demonstrate improved strength of L hip to 5/5, to improve ability for jujutsu, and hip abduction motion without pain ? 07/06/21 MET  ?4 Pt to demonstrate optimal mechanics and abi

## 2021-07-05 ENCOUNTER — Encounter: Payer: BC Managed Care – PPO | Admitting: Physical Therapy

## 2021-07-05 NOTE — Progress Notes (Signed)
?Brett [name] D.O. ?[facility] ?[place] ?Phone: [phone] ?Subjective:   ?I, Brett [name], am serving as a scribe for Dr. Hulan Saas. ? ?This visit occurred during the SARS-CoV-2 public health emergency.  Safety protocols were in place, including screening questions prior to the visit, additional usage of staff PPE, and extensive cleaning of exam room while observing appropriate contact time as indicated for disinfecting solutions.  ? ? ?I'm seeing this patient by the request  of:  Vivi Barrack, MD ? ?CC: Back and neck pain follow-up ? ?[facility]:ENIDPOEUMP  ?Brett [name] is a 46 y.o. male coming in with complaint of back and neck pain. OMT 05/27/2021. Also f/u for L knee pain. Patient has been doing physical therapy. Patient states that his back is doing better. Still hurts with lumbar flexion on R side at times as well as L hip pain deep in the joint. Pain hip will be sharp when it does occur. Does feel like physical therapy helped.  ? ?Medications patient has been prescribed: None ? ?Taking: ? ? ?  ? ? ? ? ?Reviewed prior external information including notes and imaging from previsou exam, outside providers and external EMR if available.  ? ?As well as notes that were available from care everywhere and other healthcare systems. ? ?Past medical history, social, surgical and family history all reviewed in electronic medical record.  No pertanent information unless stated regarding to the chief complaint.  ? ?Past Medical History:  ?Diagnosis Date  ? Asthma   ? Bipolar 1 disorder ([place]) 06/15/2016  ? Previously followed by Psychiatry. Hx of "anger issues." Hx of medication use has included Seroquel and Ativan, both of which have helped him in the past.   ? Chronic pain of both knees 06/15/2016  ? Chronic seasonal allergic rhinitis due to pollen 06/15/2016  ? Depression   ? Gastroesophageal reflux disease without esophagitis 06/15/2016  ? History of diabetes mellitus  05/17/2019  ? Resolved after > 100 pound weight loss and lifestyle changes. 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   ? Hypertension   ? Insomnia 06/15/2016  ? Left rotator cuff tear arthropathy 06/15/2016  ? s/p repair and PT. No ongoing issues.  ? Morbid obesity ([place]) 06/15/2016  ?  ?No Known Allergies ? ? ?Review of Systems: ? No headache, visual changes, nausea, vomiting, diarrhea, constipation, dizziness, abdominal pain, skin rash, fevers, chills, night sweats, weight loss, swollen lymph nodes, body aches, joint swelling, chest pain, shortness of breath, mood changes. POSITIVE muscle aches ? ?Objective  ?Blood pressure 122/84, pulse 70, height '5\' 11"'$  (1.803 m), weight 218 lb (98.9 kg), SpO2 99 %. ?  ?General: No apparent distress alert and oriented x3 mood and affect normal, dressed appropriately.  ?HEENT: Pupils equal, extraocular movements intact  ?Respiratory: Patient's speak in full sentences and does not appear short of breath  ?Cardiovascular: No lower extremity edema, non tender, no erythema  ?Back exam does have some loss of lordosis.  Tenderness to palpation in the paraspinal musculature of the lumbar spine.  Patient does have significant tightness of the left hip with only 5 degrees of internal rotation which is worse than previous exam.  Arthritic changes of the left knee noted. ? ?Osteopathic findings ? ?C2 flexed rotated and side bent right ?C7 flexed rotated and side bent left ?T3 extended rotated and side bent right inhaled rib ?T6 extended rotated and side bent left ?L2 flexed rotated and side bent right ?Sacrum  right on right ? ? ? ? ?  ?Assessment and Plan: ? ?Spondylolisthesis, lumbosacral region ?Arthritic changes noted.  Has done relatively well with conservative therapy.  Does have the Celebrex.  Could potentially increase if necessary.  Patient does have Robaxin as well.  Patient does respond well to osteopathic manipulation.  Follow-up again in 6 to 8 weeks ? ?Left hip  pain ?On exam today patient did have some limited range of motion of the left hip.  The patient could be potentially having either increasing in arthritic changes but most recent pelvis MRI did not show anything significant.  The patient could potentially have a labral pathology as I discussed MR arthrogram if necessary.  Patient wants to continue with conservative therapy and see how patient responds.  Follow-up again in 6 to 8 weeks  ? ?Nonallopathic problems ? ?Decision today to treat with OMT was based on Physical Exam ? ?After verbal consent patient was treated with HVLA, ME, FPR techniques in cervical, rib, thoracic, lumbar, and sacral  areas ? ?Patient tolerated the procedure well with improvement in symptoms ? ?Patient given exercises, stretches and lifestyle modifications ? ?See medications in patient instructions if given ? ?Patient will follow up in 4-8 weeks ? ?  ? ? ?The above documentation has been reviewed and is accurate and complete [name], DO ? ? ? ?  ? ? Note: This dictation was prepared with Dragon dictation along with smaller phrase technology. Any transcriptional errors that result from this process are unintentional.    ?  ?  ? ?

## 2021-07-07 ENCOUNTER — Encounter: Payer: BC Managed Care – PPO | Admitting: Physical Therapy

## 2021-07-07 ENCOUNTER — Ambulatory Visit: Payer: BC Managed Care – PPO | Admitting: Family Medicine

## 2021-07-07 VITALS — BP 122/84 | HR 70 | Ht 71.0 in | Wt 218.0 lb

## 2021-07-07 DIAGNOSIS — M9903 Segmental and somatic dysfunction of lumbar region: Secondary | ICD-10-CM | POA: Diagnosis not present

## 2021-07-07 DIAGNOSIS — M9908 Segmental and somatic dysfunction of rib cage: Secondary | ICD-10-CM | POA: Diagnosis not present

## 2021-07-07 DIAGNOSIS — M4317 Spondylolisthesis, lumbosacral region: Secondary | ICD-10-CM | POA: Diagnosis not present

## 2021-07-07 DIAGNOSIS — M25552 Pain in left hip: Secondary | ICD-10-CM

## 2021-07-07 DIAGNOSIS — M9901 Segmental and somatic dysfunction of cervical region: Secondary | ICD-10-CM | POA: Diagnosis not present

## 2021-07-07 DIAGNOSIS — M9902 Segmental and somatic dysfunction of thoracic region: Secondary | ICD-10-CM | POA: Diagnosis not present

## 2021-07-07 DIAGNOSIS — M9904 Segmental and somatic dysfunction of sacral region: Secondary | ICD-10-CM | POA: Diagnosis not present

## 2021-07-07 NOTE — Assessment & Plan Note (Signed)
Arthritic changes noted.  Has done relatively well with conservative therapy.  Does have the Celebrex.  Could potentially increase if necessary.  Patient does have Robaxin as well.  Patient does respond well to osteopathic manipulation.  Follow-up again in 6 to 8 weeks ?

## 2021-07-07 NOTE — Assessment & Plan Note (Signed)
On exam today patient did have some limited range of motion of the left hip.  The patient could be potentially having either increasing in arthritic changes but most recent pelvis MRI did not show anything significant.  The patient could potentially have a labral pathology as I discussed MR arthrogram if necessary.  Patient wants to continue with conservative therapy and see how patient responds.  Follow-up again in 6 to 8 weeks ?

## 2021-07-07 NOTE — Patient Instructions (Signed)
Great to see you  ?Back still is a little concern but doing well  ?Keep it up  ?Hip if worse we can consider MRI with dye in the hip to look at labrum  ?See me again in 7-8 weeks ? ?

## 2021-07-08 ENCOUNTER — Encounter: Payer: BC Managed Care – PPO | Admitting: Physical Therapy

## 2021-07-08 ENCOUNTER — Other Ambulatory Visit: Payer: Self-pay | Admitting: Family Medicine

## 2021-07-08 DIAGNOSIS — E291 Testicular hypofunction: Secondary | ICD-10-CM

## 2021-08-06 ENCOUNTER — Ambulatory Visit (INDEPENDENT_AMBULATORY_CARE_PROVIDER_SITE_OTHER): Payer: BC Managed Care – PPO | Admitting: Family Medicine

## 2021-08-06 ENCOUNTER — Encounter: Payer: Self-pay | Admitting: Family Medicine

## 2021-08-06 ENCOUNTER — Encounter: Payer: BC Managed Care – PPO | Admitting: Family Medicine

## 2021-08-06 VITALS — BP 116/80 | HR 76 | Temp 98.3°F | Ht 71.0 in | Wt 209.4 lb

## 2021-08-06 DIAGNOSIS — Z8639 Personal history of other endocrine, nutritional and metabolic disease: Secondary | ICD-10-CM

## 2021-08-06 DIAGNOSIS — Z125 Encounter for screening for malignant neoplasm of prostate: Secondary | ICD-10-CM

## 2021-08-06 DIAGNOSIS — Z0001 Encounter for general adult medical examination with abnormal findings: Secondary | ICD-10-CM

## 2021-08-06 DIAGNOSIS — Z1211 Encounter for screening for malignant neoplasm of colon: Secondary | ICD-10-CM

## 2021-08-06 DIAGNOSIS — E538 Deficiency of other specified B group vitamins: Secondary | ICD-10-CM

## 2021-08-06 DIAGNOSIS — R739 Hyperglycemia, unspecified: Secondary | ICD-10-CM

## 2021-08-06 DIAGNOSIS — F902 Attention-deficit hyperactivity disorder, combined type: Secondary | ICD-10-CM

## 2021-08-06 DIAGNOSIS — E785 Hyperlipidemia, unspecified: Secondary | ICD-10-CM | POA: Diagnosis not present

## 2021-08-06 DIAGNOSIS — N529 Male erectile dysfunction, unspecified: Secondary | ICD-10-CM

## 2021-08-06 DIAGNOSIS — F39 Unspecified mood [affective] disorder: Secondary | ICD-10-CM

## 2021-08-06 LAB — LIPID PANEL
Cholesterol: 185 mg/dL (ref 0–200)
HDL: 41 mg/dL (ref >39.00)
LDL Cholesterol: 122 mg/dL — ABNORMAL HIGH (ref 0–99)
NonHDL: 143.53
Total CHOL/HDL Ratio: 5
Triglycerides: 110 mg/dL (ref 0.0–149.0)
VLDL: 22 mg/dL (ref 0.0–40.0)

## 2021-08-06 LAB — PSA: PSA: 0.78 ng/mL (ref 0.10–4.00)

## 2021-08-06 LAB — COMPREHENSIVE METABOLIC PANEL
ALT: 25 U/L (ref 0–53)
AST: 21 U/L (ref 0–37)
Albumin: 4.8 g/dL (ref 3.5–5.2)
Alkaline Phosphatase: 40 U/L (ref 39–117)
BUN: 22 mg/dL (ref 6–23)
CO2: 29 mEq/L (ref 19–32)
Calcium: 9.4 mg/dL (ref 8.4–10.5)
Chloride: 103 mEq/L (ref 96–112)
Creatinine, Ser: 0.97 mg/dL (ref 0.40–1.50)
GFR: 93.56 mL/min (ref >60.00)
Glucose, Bld: 98 mg/dL (ref 70–99)
Potassium: 4.3 mEq/L (ref 3.5–5.1)
Sodium: 141 mEq/L (ref 135–145)
Total Bilirubin: 0.8 mg/dL (ref 0.2–1.2)
Total Protein: 6.9 g/dL (ref 6.0–8.3)

## 2021-08-06 LAB — CBC
HCT: 46.3 % (ref 39.0–52.0)
Hemoglobin: 15.7 g/dL (ref 13.0–17.0)
MCHC: 34 g/dL (ref 30.0–36.0)
MCV: 90.4 fl (ref 78.0–100.0)
Platelets: 116 10*3/uL — ABNORMAL LOW (ref 150.0–400.0)
RBC: 5.12 Mil/uL (ref 4.22–5.81)
RDW: 13.9 % (ref 11.5–15.5)
WBC: 4.7 10*3/uL (ref 4.0–10.5)

## 2021-08-06 LAB — HEMOGLOBIN A1C: Hgb A1c MFr Bld: 5.4 % (ref 4.6–6.5)

## 2021-08-06 LAB — VITAMIN B12: Vitamin B-12: 545 pg/mL (ref 211–911)

## 2021-08-06 LAB — TSH: TSH: 2.57 u[IU]/mL (ref 0.35–5.50)

## 2021-08-06 MED ORDER — LISDEXAMFETAMINE DIMESYLATE 60 MG PO CAPS: 60.0000 mg | ORAL_CAPSULE | ORAL | 0 refills | Status: DC

## 2021-08-06 NOTE — Assessment & Plan Note (Signed)
Uses Cialis as needed sporadically.  Does not need refill today. ?

## 2021-08-06 NOTE — Patient Instructions (Signed)
It was very nice to see you today! ? ?We will check blood work today.  I will get you set up for your colonoscopy. ? ?We will send in 90-day supply for your Vyvanse if your insurance will allow it. ? ?Please come back to see me in 6 months for medication check. ? ?We will see back in year for your physical.  Please come back to see me sooner if needed. ? ?Take care, ?Dr [name] ? ?PLEASE NOTE: ? ?If you had any lab tests please let us know if you have not heard back within a few days. You may see your results on mychart before we have a chance to review them but we will give you a call once they are reviewed by [place]. If we ordered any referrals today, please let us know if you have not heard from their office within the next week.  ? ?Please try these tips to maintain a healthy lifestyle: ? ?Eat at least 3 REAL meals and 1-2 snacks per day.  Aim for no more than 5 hours between eating.  If you eat breakfast, please do so within one hour of getting up.  ? ?Each meal should contain half fruits/vegetables, one quarter protein, and one quarter carbs (no bigger than a computer mouse) ? ?Cut down on sweet beverages. This includes juice, soda, and sweet tea.  ? ?Drink at least 1 glass of water with each meal and aim for at least 8 glasses per day ? ?Exercise at least 150 minutes every week.   ? ?Preventive Care 22-40 Years Old, Male ?Preventive care refers to lifestyle choices and visits with your health care provider that can promote health and wellness. Preventive care visits are also called wellness exams. ?What can I expect for my preventive care visit? ?Counseling ?During your preventive care visit, your health care provider may ask about your: ?Medical history, including: ?Past medical problems. ?Family medical history. ?Current health, including: ?Emotional well-being. ?Home life and relationship well-being. ?Sexual activity. ?Lifestyle, including: ?Alcohol, nicotine or tobacco, and drug use. ?Access to  firearms. ?Diet, exercise, and sleep habits. ?Safety issues such as seatbelt and bike helmet use. ?Sunscreen use. ?Work and work Statistician. ?Physical exam ?Your health care provider will check your: ?Height and weight. These may be used to calculate your BMI (body mass index). BMI is a measurement that tells if you are at a healthy weight. ?Waist circumference. This measures the distance around your waistline. This measurement also tells if you are at a healthy weight and may help predict your risk of certain diseases, such as type 2 diabetes and high blood pressure. ?Heart rate and blood pressure. ?Body temperature. ?Skin for abnormal spots. ?What immunizations do I need? ? ?Vaccines are usually given at various ages, according to a schedule. Your health care provider will recommend vaccines for you based on your age, medical history, and lifestyle or other factors, such as travel or where you work. ?What tests do I need? ?Screening ?Your health care provider may recommend screening tests for certain conditions. This may include: ?Lipid and cholesterol levels. ?Diabetes screening. This is done by checking your blood sugar (glucose) after you have not eaten for a while (fasting). ?Hepatitis B test. ?Hepatitis C test. ?HIV (human immunodeficiency virus) test. ?STI (sexually transmitted infection) testing, if you are at risk. ?Lung cancer screening. ?Prostate cancer screening. ?Colorectal cancer screening. ?Talk with your health care provider about your test results, treatment options, and if necessary, the need for more tests. ?Follow  these instructions at home: ?Eating and drinking ? ?Eat a diet that includes fresh fruits and vegetables, whole grains, lean protein, and low-fat dairy products. ?Take vitamin and mineral supplements as recommended by your health care provider. ?Do not drink alcohol if your health care provider tells you not to drink. ?If you drink alcohol: ?Limit how much you have to 0-2 drinks a  day. ?Know how much alcohol is in your drink. In the [place] one drink equals one 12 oz bottle of beer (355 mL), one 5 oz glass of wine (148 mL), or one 1? oz glass of hard liquor (44 mL). ?Lifestyle ?Brush your teeth every morning and night with fluoride toothpaste. Floss one time each day. ?Exercise for at least 30 minutes 5 or more days each week. ?Do not use any products that contain nicotine or tobacco. These products include cigarettes, chewing tobacco, and vaping devices, such as e-cigarettes. If you need help quitting, ask your health care provider. ?Do not use drugs. ?If you are sexually active, practice safe sex. Use a condom or other form of protection to prevent STIs. ?Take aspirin only as told by your health care provider. Make sure that you understand how much to take and what form to take. Work with your health care provider to find out whether it is safe and beneficial for you to take aspirin daily. ?Find healthy ways to manage stress, such as: ?Meditation, yoga, or listening to music. ?Journaling. ?Talking to a trusted person. ?Spending time with friends and family. ?Minimize exposure to UV radiation to reduce your risk of skin cancer. ?Safety ?Always wear your seat belt while driving or riding in a vehicle. ?Do not drive: ?If you have been drinking alcohol. Do not ride with someone who has been drinking. ?When you are tired or distracted. ?While texting. ?If you have been using any mind-altering substances or drugs. ?Wear a helmet and other protective equipment during sports activities. ?If you have firearms in your house, make sure you follow all gun safety procedures. ?What's next? ?Go to your health care provider once a year for an annual wellness visit. ?Ask your health care provider how often you should have your eyes and teeth checked. ?Stay up to date on all vaccines. ?This information is not intended to replace advice given to you by your health care provider. Make sure you discuss any  questions you have with your health care provider. ?Document Revised: 09/09/2020 Document Reviewed: 09/09/2020 ?Elsevier Patient Education ? [place]. ? ?

## 2021-08-06 NOTE — Progress Notes (Signed)
? ?Chief Complaint:  ?Brett [name] is a 46 y.o. male who presents today for his annual comprehensive physical exam.   ? ?Assessment/Plan:  ?Chronic Problems Addressed Today: ?Erectile dysfunction ?Uses Cialis as needed sporadically.  Does not need refill today. ? ?History of diabetes mellitus ?Check A1c. ? ?Vitamin B12 deficiency ?Check B12.  ? ?Attention deficit hyperactivity disorder (ADHD), combined type ?Database without red flags.  Medications work well with minimal side effects.  We will refill today for 90-day supply if his insurance will allow.  Follow-up in 6 months. ? ?Mood disorder ([place]) ?Mood stable.  On Wellbutrin 300 mg daily. ? ? ?Preventative Healthcare: ?Check labs.  Colonoscopy referral placed.  Up-to-date on vaccines. ? ?Patient Counseling(The following topics were reviewed and/or handout was given): ? -Nutrition: Stressed importance of moderation in sodium/caffeine intake, saturated fat and cholesterol, caloric balance, sufficient intake of fresh fruits, vegetables, and fiber. ? -Stressed the importance of regular exercise.  ? -Substance Abuse: Discussed cessation/primary prevention of tobacco, alcohol, or other drug use; driving or other dangerous activities under the influence; availability of treatment for abuse.  ? -Injury prevention: Discussed safety belts, safety helmets, smoke detector, smoking near bedding or upholstery.  ? -Sexuality: Discussed sexually transmitted diseases, partner selection, use of condoms, avoidance of unintended pregnancy and contraceptive alternatives.  ? -Dental health: Discussed importance of regular tooth brushing, flossing, and dental visits. ? -Health maintenance and immunizations reviewed. Please refer to Health maintenance section. ? ?Return to care in 1 year for next preventative visit.  ? ?  ?Subjective:  ?HPI: ? ?He has no acute complaints today.  ? ?Lifestyle ?Diet: Balanced. Plenty of fruits and vegetables.  ?Exercise: Routine.  ? ? ?  09/24/2020   ?  8:30 AM  ?Depression screen PHQ 2/9  ?Decreased Interest 0  ?Down, Depressed, Hopeless 0  ?PHQ - 2 Score 0  ? ? ?There are no preventive care reminders to display for this patient.  ? ?ROS: Per HPI, otherwise a complete review of systems was negative.  ? ?PMH: ? ?The following were reviewed and entered/updated in epic: ?Past Medical History:  ?Diagnosis Date  ? Asthma   ? Bipolar 1 disorder ([place]) 06/15/2016  ? Previously followed by Psychiatry. Hx of "anger issues." Hx of medication use has included Seroquel and Ativan, both of which have helped him in the past.   ? Chronic pain of both knees 06/15/2016  ? Chronic seasonal allergic rhinitis due to pollen 06/15/2016  ? Depression   ? Gastroesophageal reflux disease without esophagitis 06/15/2016  ? History of diabetes mellitus 05/17/2019  ? Resolved after > 100 pound weight loss and lifestyle changes. 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   ? Hypertension   ? Insomnia 06/15/2016  ? Left rotator cuff tear arthropathy 06/15/2016  ? s/p repair and PT. No ongoing issues.  ? Morbid obesity ([place]) 06/15/2016  ? ?Patient Active Problem List  ? Diagnosis Date Noted  ? Left hip pain 07/07/2021  ? Erectile dysfunction 06/21/2021  ? Somatic dysfunction of spine, lumbar 12/04/2020  ? Vitamin B12 deficiency 05/17/2019  ? History of diabetes mellitus 05/17/2019  ? Attention deficit hyperactivity disorder (ADHD), combined type 09/27/2017  ? Unilateral primary osteoarthritis, left knee 07/10/2017  ? Spondylolisthesis, lumbosacral region 07/10/2017  ? Chronic midline low back pain without sciatica 07/10/2017  ? Hypogonadism in male 05/20/2017  ? Mood disorder ([place]) 06/15/2016  ? Chronic seasonal allergic rhinitis due to pollen 06/15/2016  ? Peripheral polyneuropathy 06/15/2016  ? Insomnia  06/15/2016  ? Moderate persistent asthma   ? ?Past Surgical History:  ?Procedure Laterality Date  ? BICEPS TENDON REPAIR    ? PANNICULECTOMY  11/15/2018  ? loose skin removal surgery   ? ? ?History reviewed. No pertinent family history. ? ?Medications- reviewed and updated ?Current Outpatient Medications  ?Medication Sig Dispense Refill  ? albuterol (VENTOLIN HFA) 108 (90 Base) MCG/ACT inhaler INHALE 2 PUFFS BY MOUTH EVERY 6 HOURS AS NEEDED FOR WHEEZE OR SHORTNESS OF BREATH 18 each 3  ? buPROPion (WELLBUTRIN XL) 300 MG 24 hr tablet TAKE 1 TABLET BY MOUTH EVERY DAY 90 tablet 3  ? celecoxib (CELEBREX) 100 MG capsule TAKE 1 CAPSULE BY MOUTH TWICE A DAY AS NEEDED 180 capsule 1  ? Insulin Pen Needle 32G X 4 MM MISC Use 1 needle per day as directed 30 each 3  ? lisdexamfetamine (VYVANSE) 60 MG capsule Take 1 capsule (60 mg total) by mouth every morning. 30 capsule 0  ? lisdexamfetamine (VYVANSE) 60 MG capsule Take 1 capsule (60 mg total) by mouth every morning. 30 capsule 0  ? MAGNESIUM CITRATE PO Take 1 tablet by mouth every evening.    ? methocarbamol (ROBAXIN) 750 MG tablet Take 1 tablet (750 mg total) by mouth 2 (two) times daily as needed for muscle spasms. 90 tablet 1  ? pregabalin (LYRICA) 200 MG capsule Take 1 capsule (200 mg total) by mouth 2 (two) times daily. 180 capsule 1  ? testosterone cypionate (DEPOTESTOSTERONE CYPIONATE) 200 MG/ML injection ALTERNATE 0.5 ML ('100MG'$ ) UNDER SKIN & 1 ML ('200MG'$ ) UNDER SKIN EVERY 14 DAYS. ('300MG'$  TOTAL/MONTH) 2 mL 5  ? traZODone (DESYREL) 50 MG tablet TAKE 1 TABLET BY MOUTH AT BEDTIME AS NEEDED FOR SLEEP. 90 tablet 3  ? lisdexamfetamine (VYVANSE) 60 MG capsule Take 1 capsule (60 mg total) by mouth every morning. 90 capsule 0  ? tadalafil (CIALIS) 20 MG tablet Take 0.5-1 tablets (10-20 mg total) by mouth every other day as needed for erectile dysfunction. (Patient not taking: Reported on 08/06/2021) 10 tablet 11  ? ?No current facility-administered medications for this visit.  ? ? ?Allergies-reviewed and updated ?No Known Allergies ? ?Social History  ? ?Socioeconomic History  ? Marital status: Married  ?  Spouse name: Not on file  ? Number of children: 2  ?  Years of education: Not on file  ? Highest education level: Not on file  ?Occupational History  ? Occupation: Conservator, museum/gallery  ?  Employer: [employer]  ?Tobacco Use  ? Smoking status: Never  ? Smokeless tobacco: Never  ?Substance and Sexual Activity  ? Alcohol use: No  ? Drug use: No  ? Sexual activity: Yes  ?Other Topics Concern  ? Not on file  ?Social History Narrative  ? Lives with wife and two children, all patients of [facility]. Wants to get back to weight lifting. Lives near [place] in [place].  ? ?Social Determinants of Health  ? ?Financial Resource Strain: Not on file  ?Food Insecurity: Not on file  ?Transportation Needs: Not on file  ?Physical Activity: Not on file  ?Stress: Not on file  ?Social Connections: Not on file  ? ?   ?  ?Objective:  ?Physical Exam: ?BP 116/80 (BP Location: Left Arm)   Pulse 76   Temp 98.3 ?F (36.8 ?C) (Temporal)   Ht '5\' 11"'$  (1.803 m)   Wt 209 lb 6.4 oz (95 kg)   SpO2 96%   BMI 29.21 kg/m?   ?Body  mass index is 29.21 kg/m?. ?Wt Readings from Last 3 Encounters:  ?08/06/21 209 lb 6.4 oz (95 kg)  ?07/07/21 218 lb (98.9 kg)  ?06/21/21 214 lb 9.6 oz (97.3 kg)  ? ?Gen: NAD, resting comfortably ?HEENT: TMs normal bilaterally. OP clear. No thyromegaly noted.  ?CV: RRR with no murmurs appreciated ?Pulm: NWOB, CTAB with no crackles, wheezes, or rhonchi ?GI: Normal bowel sounds present. Soft, Nontender, Nondistended. ?MSK: no edema, cyanosis, or clubbing noted ?Skin: warm, dry ?Neuro: CN2-12 grossly intact. Strength 5/5 in upper and lower extremities. Reflexes symmetric and intact bilaterally.  ?Psych: Normal affect and thought content ?   ? ?Algis Greenhouse. [name], MD ?08/06/2021 8:33 AM  ?

## 2021-08-06 NOTE — Assessment & Plan Note (Signed)
Mood stable.  On Wellbutrin 300 mg daily. ?

## 2021-08-06 NOTE — Assessment & Plan Note (Signed)
Database without red flags.  Medications work well with minimal side effects.  We will refill today for 90-day supply if his insurance will allow.  Follow-up in 6 months. ?

## 2021-08-06 NOTE — Assessment & Plan Note (Signed)
Check B12 

## 2021-08-06 NOTE — Assessment & Plan Note (Signed)
Check A1c. 

## 2021-08-09 ENCOUNTER — Other Ambulatory Visit: Payer: Self-pay | Admitting: Family Medicine

## 2021-08-09 ENCOUNTER — Encounter: Payer: Self-pay | Admitting: Family Medicine

## 2021-08-09 DIAGNOSIS — J4530 Mild persistent asthma, uncomplicated: Secondary | ICD-10-CM

## 2021-08-09 MED ORDER — LISDEXAMFETAMINE DIMESYLATE 60 MG PO CAPS: 60.0000 mg | ORAL_CAPSULE | ORAL | 0 refills | Status: DC

## 2021-08-09 NOTE — Telephone Encounter (Signed)
See note

## 2021-08-10 NOTE — Progress Notes (Signed)
Please inform patient of the following: ? ?Labs are all stable. Do not need to make any changes to his treatment plan at this time. We can recheck in a year. ? ?Algis Greenhouse. [name], MD ?08/10/2021 8:00 AM  ?

## 2021-08-11 NOTE — Progress Notes (Signed)
Low platelets can be a common chronic finding.  His levels have been stable for several years.  This can be hereditary and is not a concern at his current levels. Recommend office visit to discuss further if he has any further questions.

## 2021-08-19 NOTE — Progress Notes (Signed)
[place] Phone: [phone] Subjective:   Brett [name], am serving as a scribe for Dr. Hulan Saas.   I'm seeing this patient by the request  of:  Vivi Barrack, MD  CC: Low back pain, knee pain  [id number]  07/07/2021 On exam today patient did have some limited range of motion of the left hip.  The patient could be potentially having either increasing in arthritic changes but most recent pelvis MRI did not show anything significant.  The patient could potentially have a labral pathology as I discussed MR arthrogram if necessary.  Patient wants to continue with conservative therapy and see how patient responds.  Follow-up again in 6 to 8 weeks  Arthritic changes noted.  Has done relatively well with conservative therapy.  Does have the Celebrex.  Could potentially increase if necessary.  Patient does have Robaxin as well.  Patient does respond well to osteopathic manipulation.  Follow-up again in 6 to 8 weeks  Update 08/24/2021 Brett [name] is a 46 y.o. male coming in with complaint of back and neck pain. OMT 07/07/2021. Also f/u for R hip pain. Patient states that his pain comes and goes. Pain in GT is 4/10. Pain worse immediately after workouts and in the mornings. Also complaining of pain over medial epicondyles bilaterally. States that this is new and he has not injured himself during training. Pain present when he pushes himself up from a chair. Can do push ups and benchpress without pain.   B knee pain is back to what it was. Pain is constant. L>R. Injections did help initially.   Medications patient has been prescribed: None  Taking:         Reviewed prior external information including notes and imaging from previsou exam, outside providers and external EMR if available.   As well as notes that were available from care everywhere and other healthcare systems.  Past medical history,  social, surgical and family history all reviewed in electronic medical record.  [name] pertanent information unless stated regarding to the chief complaint.   Past Medical History:  Diagnosis Date   Asthma    Bipolar 1 disorder ([place]) 06/15/2016   Previously followed by Psychiatry. Hx of "anger issues." Hx of medication use has included Seroquel and Ativan, both of which have helped him in the past.    Chronic pain of both knees 06/15/2016   Chronic seasonal allergic rhinitis due to pollen 06/15/2016   Depression    Gastroesophageal reflux disease without esophagitis 06/15/2016   History of diabetes mellitus 05/17/2019   Resolved after > 100 pound weight loss and lifestyle changes. 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 [name] ongoing issues.   Morbid obesity ([place]) 06/15/2016    [name] Known Allergies   Review of Systems:  [name] headache, visual changes, nausea, vomiting, diarrhea, constipation, dizziness, abdominal pain, skin rash, fevers, chills, night sweats, weight loss, swollen lymph nodes, body aches, joint swelling, chest pain, shortness of breath, mood changes. POSITIVE muscle aches  Objective  Blood pressure 110/84, pulse 75, height '5\' 11"'$  (1.803 m), weight 211 lb (95.7 kg), SpO2 99 %.   General: [name] apparent distress alert and oriented x3 mood and affect normal, dressed appropriately.  HEENT: Pupils equal, extraocular movements intact  Respiratory: Patient's speak in full sentences and  does not appear short of breath  Cardiovascular: [name] lower extremity edema, non tender, [name] erythema  Low back exam does have some loss of lordosis.  Some very difficulty with full extension of the back.  Patient does have some tightness of the hamstrings bilaterally but [name] true radicular symptoms.  Left knee exam does have some instability noted.  Trace effusion of the lateral compartment.   Patient does have pain over the medial compartment.  Osteopathic findings  C6 flexed rotated and side bent left T7 extended rotated and side bent right inhaled rib T10 extended rotated and side bent left L1 flexed rotated and side bent right L4 flexed rotated and side bent left Sacrum right on right  After informed written and verbal consent, patient was seated on exam table. Left knee was prepped with alcohol swab and utilizing anterolateral approach, patient's left knee space was injected with 4:1  marcaine 0.5%: Kenalog '40mg'$ /dL. Patient tolerated the procedure well without immediate complications.     Assessment and Plan:  Unilateral primary osteoarthritis, left knee Seem to not get significant improvement with the gel injection.  The patient states a little bit but not severe amount.  Steroid injection given today.  Discussed with patient about the possibility of an MRI to further evaluate the amount of arthritic changes and see if he is a candidate for the possibility of MRI.  Patient wants to hold on any advanced imaging at this moment.  Discussed with patient that if this continues we do need to consider it.  Patient will follow-up with me again in 6 to 8 weeks otherwise.  Chronic midline low back pain without sciatica Known arthritic changes with spondylolisthesis.  Patient is taking Lyrica at a fairly high dose.  [name] concern the patient's leg pain seems to be more secondary to this.  He does have muscle relaxers also for any type of breakthrough pain.  Discussed the possibility of other injections.  We will get other laboratory work-up to make sure vitamin D is coming down.  Testosterone he is still working on.  Follow-up again in 6 to 8 weeks.  Does respond to manipulation.   Nonallopathic problems  Decision today to treat with OMT was based on Physical Exam  After verbal consent patient was treated with HVLA, ME, FPR techniques in cervical, rib, thoracic, lumbar, and sacral   areas  Patient tolerated the procedure well with improvement in symptoms  Patient given exercises, stretches and lifestyle modifications  See medications in patient instructions if given  Patient will follow up in 4-8 weeks      The above documentation has been reviewed and is accurate and complete [name], DO       Note: This dictation was prepared with Dragon dictation along with smaller phrase technology. Any transcriptional errors that result from this process are unintentional.

## 2021-08-25 ENCOUNTER — Ambulatory Visit: Payer: BC Managed Care – PPO | Admitting: Family Medicine

## 2021-08-25 ENCOUNTER — Encounter: Payer: Self-pay | Admitting: Family Medicine

## 2021-08-25 VITALS — BP 110/84 | HR 75 | Ht 71.0 in | Wt 211.0 lb

## 2021-08-25 DIAGNOSIS — M9903 Segmental and somatic dysfunction of lumbar region: Secondary | ICD-10-CM | POA: Diagnosis not present

## 2021-08-25 DIAGNOSIS — M9908 Segmental and somatic dysfunction of rib cage: Secondary | ICD-10-CM

## 2021-08-25 DIAGNOSIS — M9901 Segmental and somatic dysfunction of cervical region: Secondary | ICD-10-CM

## 2021-08-25 DIAGNOSIS — M1712 Unilateral primary osteoarthritis, left knee: Secondary | ICD-10-CM

## 2021-08-25 DIAGNOSIS — G8929 Other chronic pain: Secondary | ICD-10-CM

## 2021-08-25 DIAGNOSIS — M2550 Pain in unspecified joint: Secondary | ICD-10-CM | POA: Diagnosis not present

## 2021-08-25 DIAGNOSIS — M545 Low back pain, unspecified: Secondary | ICD-10-CM

## 2021-08-25 DIAGNOSIS — M9902 Segmental and somatic dysfunction of thoracic region: Secondary | ICD-10-CM

## 2021-08-25 DIAGNOSIS — M9904 Segmental and somatic dysfunction of sacral region: Secondary | ICD-10-CM | POA: Diagnosis not present

## 2021-08-25 LAB — CBC WITH DIFFERENTIAL/PLATELET
Basophils Absolute: 0 10*3/uL (ref 0.0–0.1)
Basophils Relative: 0.9 % (ref 0.0–3.0)
Eosinophils Absolute: 0.1 10*3/uL (ref 0.0–0.7)
Eosinophils Relative: 1.7 % (ref 0.0–5.0)
HCT: 47.6 % (ref 39.0–52.0)
Hemoglobin: 16 g/dL (ref 13.0–17.0)
Lymphocytes Relative: 38.8 % (ref 12.0–46.0)
Lymphs Abs: 1.8 10*3/uL (ref 0.7–4.0)
MCHC: 33.6 g/dL (ref 30.0–36.0)
MCV: 90.1 fl (ref 78.0–100.0)
Monocytes Absolute: 0.5 10*3/uL (ref 0.1–1.0)
Monocytes Relative: 10.9 % (ref 3.0–12.0)
Neutro Abs: 2.2 10*3/uL (ref 1.4–7.7)
Neutrophils Relative %: 47.7 % (ref 43.0–77.0)
Platelets: 113 10*3/uL — ABNORMAL LOW (ref 150.0–400.0)
RBC: 5.28 Mil/uL (ref 4.22–5.81)
RDW: 13.8 % (ref 11.5–15.5)
WBC: 4.6 10*3/uL (ref 4.0–10.5)

## 2021-08-25 LAB — VITAMIN B12: Vitamin B-12: 450 pg/mL (ref 211–911)

## 2021-08-25 LAB — COMPREHENSIVE METABOLIC PANEL
ALT: 29 U/L (ref 0–53)
AST: 23 U/L (ref 0–37)
Albumin: 4.6 g/dL (ref 3.5–5.2)
Alkaline Phosphatase: 36 U/L — ABNORMAL LOW (ref 39–117)
BUN: 18 mg/dL (ref 6–23)
CO2: 31 mEq/L (ref 19–32)
Calcium: 9.6 mg/dL (ref 8.4–10.5)
Chloride: 102 mEq/L (ref 96–112)
Creatinine, Ser: 1.01 mg/dL (ref 0.40–1.50)
GFR: 89.1 mL/min (ref >60.00)
Glucose, Bld: 91 mg/dL (ref 70–99)
Potassium: 4.8 mEq/L (ref 3.5–5.1)
Sodium: 140 mEq/L (ref 135–145)
Total Bilirubin: 0.8 mg/dL (ref 0.2–1.2)
Total Protein: 7 g/dL (ref 6.0–8.3)

## 2021-08-25 LAB — VITAMIN D 25 HYDROXY (VIT D DEFICIENCY, FRACTURES): VITD: 72.2 ng/mL (ref 30.00–100.00)

## 2021-08-25 NOTE — Assessment & Plan Note (Signed)
Seem to not get significant improvement with the gel injection.  The patient states a little bit but not severe amount.  Steroid injection given today.  Discussed with patient about the possibility of an MRI to further evaluate the amount of arthritic changes and see if he is a candidate for the possibility of MRI.  Patient wants to hold on any advanced imaging at this moment.  Discussed with patient that if this continues we do need to consider it.  Patient will follow-up with me again in 6 to 8 weeks otherwise.

## 2021-08-25 NOTE — Patient Instructions (Signed)
Injected knee today We can consider MRI or PRP Popping in back is ok See me again in 7-8 weeks

## 2021-08-25 NOTE — Assessment & Plan Note (Signed)
Known arthritic changes with spondylolisthesis.  Patient is taking Lyrica at a fairly high dose.  No concern the patient's leg pain seems to be more secondary to this.  He does have muscle relaxers also for any type of breakthrough pain.  Discussed the possibility of other injections.  We will get other laboratory work-up to make sure vitamin D is coming down.  Testosterone he is still working on.  Follow-up again in 6 to 8 weeks.  Does respond to manipulation.

## 2021-10-11 NOTE — Progress Notes (Unsigned)
[place] Phone: [phone] Subjective:   [name], am serving as a scribe for Dr. Hulan Saas.  I'm seeing this patient by the request  of:  Vivi Barrack, MD  CC: back and neck pain   [id number]  Brett [name] is a 46 y.o. male coming in with complaint of back and neck pain. OMT 08/25/2021. Also f/u for L knee pain. Steroid injection last visit. Patient states that he is the same as last visit. Injected did not give him much relief.   Medications patient has been prescribed: None           Reviewed prior external information including notes and imaging from previsou exam, outside providers and external EMR if available.   As well as notes that were available from care everywhere and other healthcare systems.  Past medical history, social, surgical and family history all reviewed in electronic medical record.  No pertanent information unless stated regarding to the chief complaint.   Past Medical History:  Diagnosis Date   Asthma    Bipolar 1 disorder ([place]) 06/15/2016   Previously followed by Psychiatry. Hx of "anger issues." Hx of medication use has included Seroquel and Ativan, both of which have helped him in the past.    Chronic pain of both knees 06/15/2016   Chronic seasonal allergic rhinitis due to pollen 06/15/2016   Depression    Gastroesophageal reflux disease without esophagitis 06/15/2016   History of diabetes mellitus 05/17/2019   Resolved after > 100 pound weight loss and lifestyle changes. 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 No ongoing issues.   Morbid obesity ([place]) 06/15/2016    No Known Allergies   Review of Systems:  No headache, visual changes, nausea, vomiting, diarrhea, constipation, dizziness, abdominal pain, skin rash, fevers,  chills, night sweats, weight loss, swollen lymph nodes, body aches, joint swelling, chest pain, shortness of breath, mood changes. POSITIVE muscle aches  Objective  Blood pressure 120/82, pulse 70, height '5\' 11"'$  (1.803 m), weight 213 lb (96.6 kg), SpO2 98 %.   General: No apparent distress alert and oriented x3 mood and affect normal, dressed appropriately.  HEENT: Pupils equal, extraocular movements intact  Respiratory: Patient's speak in full sentences and does not appear short of breath  Cardiovascular: No lower extremity edema, non tender, no erythema  Gait right knee still has the abnormality noted of the medial joint line.  Crepitus noted with range of motion of the patella. MSK:  Back low back exam does have some loss of lordosis.  Tenderness to palpation still noted mostly in the back.  Some limited range of motion.  Patient does have some difficulty with the left shoulder today.  Osteopathic findings  C2 flexed rotated and side bent right C6 flexed rotated and side bent left T3 extended rotated and side bent left inhaled rib T11 extended rotated and side bent left L1 flexed rotated and side bent right Sacrum right on right  Limited muscular skeletal ultrasound was performed and interpreted by Hulan Saas, M  Limited ultrasound of patient's left knee shows no significant hypoechoic changes in the patellofemoral joint does have areas where good appear to be grade IV chondromalacia.  Narrowing of the medial joint space noted as well. Impression: No effusion but significant arthritis  Assessment and Plan:  Unilateral primary osteoarthritis, left knee We will continue to have pain.  Not responding as much of the injection.  Did do viscosupplementation in March of this year.  We discussed the possibility of PRP handout.  Follow-up again in 2 to 3 months  Spondylolisthesis, lumbosacral region Significant arthritic changes for his back.  For his age.  Patient is doing  relatively well with conservative therapy.  Continues to stay active.  We discussed with patient about icing regimen and home exercises.  Follow-up again in 6 to 8 weeks otherwise.    Nonallopathic problems  Decision today to treat with OMT was based on Physical Exam  After verbal consent patient was treated with HVLA, ME, FPR techniques in cervical, rib, thoracic, lumbar, and sacral  areas  Patient tolerated the procedure well with improvement in symptoms  Patient given exercises, stretches and lifestyle modifications  See medications in patient instructions if given  Patient will follow up in 4-8 weeks     The above documentation has been reviewed and is accurate and complete [name], DO         Note: This dictation was prepared with Dragon dictation along with smaller phrase technology. Any transcriptional errors that result from this process are unintentional.

## 2021-10-13 ENCOUNTER — Encounter: Payer: Self-pay | Admitting: Family Medicine

## 2021-10-13 ENCOUNTER — Ambulatory Visit: Payer: Self-pay

## 2021-10-13 ENCOUNTER — Ambulatory Visit: Payer: BC Managed Care – PPO | Admitting: Family Medicine

## 2021-10-13 VITALS — BP 120/82 | HR 70 | Ht 71.0 in | Wt 213.0 lb

## 2021-10-13 DIAGNOSIS — M4317 Spondylolisthesis, lumbosacral region: Secondary | ICD-10-CM

## 2021-10-13 DIAGNOSIS — M9908 Segmental and somatic dysfunction of rib cage: Secondary | ICD-10-CM | POA: Diagnosis not present

## 2021-10-13 DIAGNOSIS — M9903 Segmental and somatic dysfunction of lumbar region: Secondary | ICD-10-CM

## 2021-10-13 DIAGNOSIS — M25562 Pain in left knee: Secondary | ICD-10-CM

## 2021-10-13 DIAGNOSIS — M9901 Segmental and somatic dysfunction of cervical region: Secondary | ICD-10-CM

## 2021-10-13 DIAGNOSIS — M1712 Unilateral primary osteoarthritis, left knee: Secondary | ICD-10-CM | POA: Diagnosis not present

## 2021-10-13 DIAGNOSIS — M9904 Segmental and somatic dysfunction of sacral region: Secondary | ICD-10-CM | POA: Diagnosis not present

## 2021-10-13 DIAGNOSIS — M9902 Segmental and somatic dysfunction of thoracic region: Secondary | ICD-10-CM

## 2021-10-13 NOTE — Patient Instructions (Signed)
Consider PRP for knee Get payback for shoulder See me in 2-3 months

## 2021-10-13 NOTE — Assessment & Plan Note (Signed)
We will continue to have pain.  Not responding as much of the injection.  Did do viscosupplementation in March of this year.  We discussed the possibility of PRP handout.  Follow-up again in 2 to 3 months

## 2021-10-13 NOTE — Assessment & Plan Note (Signed)
Significant arthritic changes for his back.  For his age.  Patient is doing relatively well with conservative therapy.  Continues to stay active.  We discussed with patient about icing regimen and home exercises.  Follow-up again in 6 to 8 weeks otherwise.

## 2021-10-26 ENCOUNTER — Other Ambulatory Visit: Payer: Self-pay | Admitting: Family Medicine

## 2021-10-26 DIAGNOSIS — M2550 Pain in unspecified joint: Secondary | ICD-10-CM

## 2021-12-01 ENCOUNTER — Other Ambulatory Visit: Payer: Self-pay | Admitting: Family Medicine

## 2021-12-01 NOTE — Telephone Encounter (Signed)
  LAST APPOINTMENT DATE:  08/06/21  NEXT APPOINTMENT DATE: 02/09/22  MEDICATION: lisdexamfetamine (VYVANSE) 60 MG capsule  Is the patient out of medication? Yes  PHARMACY: [facility] [place], [place], [place] [address]

## 2021-12-02 MED ORDER — LISDEXAMFETAMINE DIMESYLATE 60 MG PO CAPS: 60.0000 mg | ORAL_CAPSULE | ORAL | 0 refills | Status: DC

## 2021-12-20 ENCOUNTER — Encounter: Payer: Self-pay | Admitting: *Deleted

## 2021-12-29 ENCOUNTER — Encounter: Payer: Self-pay | Admitting: Family Medicine

## 2021-12-29 ENCOUNTER — Other Ambulatory Visit: Payer: Self-pay | Admitting: Family Medicine

## 2021-12-29 DIAGNOSIS — G629 Polyneuropathy, unspecified: Secondary | ICD-10-CM

## 2021-12-29 DIAGNOSIS — J4530 Mild persistent asthma, uncomplicated: Secondary | ICD-10-CM

## 2021-12-30 MED ORDER — LISDEXAMFETAMINE DIMESYLATE 60 MG PO CAPS: 60.0000 mg | ORAL_CAPSULE | ORAL | 0 refills | Status: DC

## 2022-01-13 ENCOUNTER — Ambulatory Visit: Payer: BC Managed Care – PPO | Admitting: Family Medicine

## 2022-01-13 ENCOUNTER — Encounter: Payer: Self-pay | Admitting: Family Medicine

## 2022-01-13 VITALS — BP 138/98 | HR 92 | Ht 71.0 in | Wt 226.0 lb

## 2022-01-13 DIAGNOSIS — M9903 Segmental and somatic dysfunction of lumbar region: Secondary | ICD-10-CM

## 2022-01-13 DIAGNOSIS — M9904 Segmental and somatic dysfunction of sacral region: Secondary | ICD-10-CM

## 2022-01-13 DIAGNOSIS — G8929 Other chronic pain: Secondary | ICD-10-CM | POA: Diagnosis not present

## 2022-01-13 DIAGNOSIS — M9901 Segmental and somatic dysfunction of cervical region: Secondary | ICD-10-CM | POA: Diagnosis not present

## 2022-01-13 DIAGNOSIS — M25512 Pain in left shoulder: Secondary | ICD-10-CM

## 2022-01-13 DIAGNOSIS — M545 Low back pain, unspecified: Secondary | ICD-10-CM | POA: Diagnosis not present

## 2022-01-13 DIAGNOSIS — M9902 Segmental and somatic dysfunction of thoracic region: Secondary | ICD-10-CM

## 2022-01-13 DIAGNOSIS — M9908 Segmental and somatic dysfunction of rib cage: Secondary | ICD-10-CM

## 2022-01-13 NOTE — Assessment & Plan Note (Signed)
Patient given injection and tolerated the procedure well, discussed icing regimen and home exercises, which activities to do which ones to avoid.  Patient pending improvement in range of motion immediately.  If worsening pain will consider further imaging but I do think with some scapular stability and avoiding overhead activities patient should do relatively well.  Follow-up again in 6 to 8 weeks

## 2022-01-13 NOTE — Assessment & Plan Note (Signed)
Chronic, with mild exacerbation.  Likely this time a year as well.  Discussed medications including Celebrex 100 mg up to twice a day.  Patient does to get Lyrica from another provider.  Increase activity as tolerated and continuing to work on core strength.  Follow-up again in 6 to 8 weeks.

## 2022-01-13 NOTE — Patient Instructions (Addendum)
Injected AC joint See me again in 6-8 weeks

## 2022-01-13 NOTE — Progress Notes (Signed)
Brett [name] Phone: [phone] Subjective:   [name], am serving as a scribe for Dr. Hulan Saas.  I'm seeing this patient by the request  of:  Vivi Barrack, MD  CC: Neck, back, knee and shoulder pain  BJS:[id number]  Dirck Butch is a 46 y.o. male coming in with complaint of back and neck pain. OMT 10/13/2021. Also f/u for L knee pain. Patient states that his back is the same as last visit. Sitting increases his pain. Able to workout.   States that he feels like his knees are buckling more recently. Also notes pressure in the knee joint.   Also L shoulder pain continues over AC. Painful to touch and with all movements. Feels like symptoms are worsening despite being able to stay active through the pain.   Medications patient has been prescribed: Methocarbamol, Celebrex  Taking:         Reviewed prior external information including notes and imaging from previsou exam, outside providers and external EMR if available.   As well as notes that were available from care everywhere and other healthcare systems.  Past medical history, social, surgical and family history all reviewed in electronic medical record.  No pertanent information unless stated regarding to the chief complaint.   Past Medical History:  Diagnosis Date   Asthma    Bipolar 1 disorder ([place]) 06/15/2016   Previously followed by Psychiatry. Hx of "anger issues." Hx of medication use has included Seroquel and Ativan, both of which have helped him in the past.    Chronic pain of both knees 06/15/2016   Chronic seasonal allergic rhinitis due to pollen 06/15/2016   Depression    Gastroesophageal reflux disease without esophagitis 06/15/2016   History of diabetes mellitus 05/17/2019   Resolved after > 100 pound weight loss and lifestyle changes. 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    Hypertension    Insomnia  06/15/2016   Left rotator cuff tear arthropathy 06/15/2016   s/p repair and PT. No ongoing issues.   Morbid obesity ([place]) 06/15/2016    No Known Allergies   Review of Systems:  No headache, visual changes, nausea, vomiting, diarrhea, constipation, dizziness, abdominal pain, skin rash, fevers, chills, night sweats, weight loss, swollen lymph nodes, body aches, joint swelling, chest pain, shortness of breath, mood changes. POSITIVE muscle aches  Objective  Blood pressure (!) 138/98, pulse 92, height '5\' 11"'$  (1.803 m), weight 226 lb (102.5 kg), SpO2 95 %.   General: No apparent distress alert and oriented x3 mood and affect normal, dressed appropriately.  HEENT: Pupils equal, extraocular movements intact  Respiratory: Patient's speak in full sentences and does not appear short of breath  Cardiovascular: No lower extremity edema, non tender, no erythema  Gait normal  MSK:  Back significant loss of lordosis noted.  The patient does have tightness in the lower back with flexion extension noted.  Patient's neck exam does have some limited sidebending bilaterally. Left shoulder does have severe tenderness over the acromioclavicular joint and does have positive impingement.  Osteopathic findings  C2 flexed rotated and side bent right C6 flexed rotated and side bent left T3 extended rotated and side bent left inhaled rib L1 flexed rotated and side bent right Sacrum right on right   After verbal consent patient was prepped with alcohol swab and with a 25-gauge half inch needle injected into the left acromioclavicular joint with a total of 0.5  cc of 0.5% Marcaine and 0.5 cc of Kenalog 40 mg per mill.  No blood loss.  Band-Aid placed.  Full range of motion with no pain and full strength after the injection.  Postinjection instructions given    Assessment and Plan:  Pain in left acromioclavicular joint Patient given injection and tolerated the procedure well, discussed icing regimen and home  exercises, which activities to do which ones to avoid.  Patient pending improvement in range of motion immediately.  If worsening pain will consider further imaging but I do think with some scapular stability and avoiding overhead activities patient should do relatively well.  Follow-up again in 6 to 8 weeks  Chronic midline low back pain without sciatica Chronic, with mild exacerbation.  Likely this time a year as well.  Discussed medications including Celebrex 100 mg up to twice a day.  Patient does to get Lyrica from another provider.  Increase activity as tolerated and continuing to work on core strength.  Follow-up again in 6 to 8 weeks.    Nonallopathic problems  Decision today to treat with OMT was based on Physical Exam  After verbal consent patient was treated with HVLA, ME, FPR techniques in cervical, rib, thoracic, lumbar, and sacral  areas  Patient tolerated the procedure well with improvement in symptoms  Patient given exercises, stretches and lifestyle modifications  See medications in patient instructions if given  Patient will follow up in 4-8 weeks     The above documentation has been reviewed and is accurate and complete [name], DO         Note: This dictation was prepared with Dragon dictation along with smaller phrase technology. Any transcriptional errors that result from this process are unintentional.

## 2022-01-21 ENCOUNTER — Encounter: Payer: Self-pay | Admitting: Family Medicine

## 2022-01-26 NOTE — Telephone Encounter (Signed)
Spoke with pharmacy [facility] in [place] is out of stock

## 2022-01-27 MED ORDER — LISDEXAMFETAMINE DIMESYLATE 60 MG PO CAPS: 60.0000 mg | ORAL_CAPSULE | ORAL | 0 refills | Status: DC

## 2022-01-30 ENCOUNTER — Telehealth: Payer: BC Managed Care – PPO | Admitting: Family Medicine

## 2022-01-30 DIAGNOSIS — B359 Dermatophytosis, unspecified: Secondary | ICD-10-CM | POA: Diagnosis not present

## 2022-01-30 MED ORDER — CLOTRIMAZOLE-BETAMETHASONE 1-0.05 % EX CREA: 1.0000 | TOPICAL_CREAM | Freq: Every day | CUTANEOUS | 0 refills | Status: DC

## 2022-01-30 NOTE — Progress Notes (Signed)
E Visit for Rash  We are sorry that you are not feeling well. Here is how we plan to help!   Based upon your presentation it appears you have a fungal infection.  I have prescribed: clotrimazole   HOME CARE:  Take cool showers and avoid direct sunlight. Apply cool compress or wet dressings. Take a bath in an oatmeal bath.  Sprinkle content of one Aveeno packet under running faucet with comfortably warm water.  Bathe for 15-20 minutes, 1-2 times daily.  Pat dry with a towel. Do not rub the rash. Use hydrocortisone cream. Take an antihistamine like Benadryl for widespread rashes that itch.  The adult dose of Benadryl is 25-50 mg by mouth 4 times daily. Caution:  This type of medication may cause sleepiness.  Do not drink alcohol, drive, or operate dangerous machinery while taking antihistamines.  Do not take these medications if you have prostate enlargement.  Read package instructions thoroughly on all medications that you take.  GET HELP RIGHT AWAY IF:  Symptoms don't go away after treatment. Severe itching that persists. If you rash spreads or swells. If you rash begins to smell. If it blisters and opens or develops a yellow-brown crust. You develop a fever. You have a sore throat. You become short of breath.  MAKE SURE YOU:  Understand these instructions. Will watch your condition. Will get help right away if you are not doing well or get worse.  Thank you for choosing an e-visit.  Your e-visit answers were reviewed by a board certified advanced clinical practitioner to complete your personal care plan. Depending upon the condition, your plan could have included both over the counter or prescription medications.  Please review your pharmacy choice. Make sure the pharmacy is open so you can pick up prescription now. If there is a problem, you may contact your provider through [employer] and have the prescription routed to another pharmacy.  Your safety is important to  [place]. If you have drug allergies check your prescription carefully.   For the next 24 hours you can use MyChart to ask questions about today's visit, request a non-urgent call back, or ask for a work or school excuse. You will get an email in the next two days asking about your experience. I hope that your e-visit has been valuable and will speed your recovery.    have provided 5 minutes of non face to face time during this encounter for chart review and documentation.

## 2022-02-02 ENCOUNTER — Other Ambulatory Visit (HOSPITAL_BASED_OUTPATIENT_CLINIC_OR_DEPARTMENT_OTHER): Payer: Self-pay

## 2022-02-02 ENCOUNTER — Ambulatory Visit: Payer: BC Managed Care – PPO | Admitting: Family Medicine

## 2022-02-02 VITALS — BP 137/92 | HR 69 | Temp 98.0°F | Ht 71.0 in | Wt 225.6 lb

## 2022-02-02 DIAGNOSIS — M545 Low back pain, unspecified: Secondary | ICD-10-CM

## 2022-02-02 DIAGNOSIS — F902 Attention-deficit hyperactivity disorder, combined type: Secondary | ICD-10-CM

## 2022-02-02 DIAGNOSIS — G8929 Other chronic pain: Secondary | ICD-10-CM

## 2022-02-02 DIAGNOSIS — D2272 Melanocytic nevi of left lower limb, including hip: Secondary | ICD-10-CM

## 2022-02-02 DIAGNOSIS — B354 Tinea corporis: Secondary | ICD-10-CM

## 2022-02-02 DIAGNOSIS — L989 Disorder of the skin and subcutaneous tissue, unspecified: Secondary | ICD-10-CM

## 2022-02-02 MED ORDER — KETOCONAZOLE 2 % EX CREA
1.0000 | TOPICAL_CREAM | Freq: Two times a day (BID) | CUTANEOUS | 0 refills | Status: DC
  Filled 2022-02-02: qty 30, 30d supply, fill #0

## 2022-02-02 MED ORDER — TERBINAFINE HCL 250 MG PO TABS
250.0000 mg | ORAL_TABLET | Freq: Every day | ORAL | 0 refills | Status: DC
Start: 2022-02-02 — End: 2022-04-25
  Filled 2022-02-02: qty 14, 14d supply, fill #0

## 2022-02-02 MED ORDER — LISDEXAMFETAMINE DIMESYLATE 60 MG PO CAPS
60.0000 mg | ORAL_CAPSULE | ORAL | 0 refills | Status: DC
  Filled 2022-02-02: qty 30, 30d supply, fill #0

## 2022-02-02 NOTE — Patient Instructions (Signed)
It was very nice to see you today!  We biopsied the mole on your leg today.  Please keep the area clean and covered for the next 24 hours.  You can shower normally however do not soak the area in water or scrub aggressively.  We will call you next week once we have the pathology report back.  I will send in a cream and a pill for the fungal infection.  Let me know if not improving.  Take care, Dr [name]  PLEASE NOTE:  If you had any lab tests please let us know if you have not heard back within a few days. You may see your results on mychart before we have a chance to review them but we will give you a call once they are reviewed by [place]. If we ordered any referrals today, please let us know if you have not heard from their office within the next week.   Please try these tips to maintain a healthy lifestyle:  Eat at least 3 REAL meals and 1-2 snacks per day.  Aim for no more than 5 hours between eating.  If you eat breakfast, please do so within one hour of getting up.   Each meal should contain half fruits/vegetables, one quarter protein, and one quarter carbs (no bigger than a computer mouse)  Cut down on sweet beverages. This includes juice, soda, and sweet tea.   Drink at least 1 glass of water with each meal and aim for at least 8 glasses per day  Exercise at least 150 minutes every week.

## 2022-02-02 NOTE — Assessment & Plan Note (Signed)
Continue management per sports medicine.  He is on Celebrex 100 mg twice daily and Lyrica 200 mg daily.

## 2022-02-02 NOTE — Assessment & Plan Note (Signed)
Doing well on Vyvanse this 60 mg daily.  Will refill today.  Medications work well with minimal side effects.

## 2022-02-02 NOTE — Progress Notes (Signed)
   Brett [name] is a 46 y.o. male who presents today for an office visit.  Assessment/Plan:  New/Acute Problems: Skin Lesion Punch biopsy performed today.  He tolerated well.  See below procedure note.  Tinea corporis Has not had any improvement with clotrimazole.  He wishes for aggressive treatment.  We will start course of terbinafine by mouth and topical ketoconazole.  We discussed reasons to return to care.  Chronic Problems Addressed Today: Attention deficit hyperactivity disorder (ADHD), combined type Doing well on Vyvanse this 60 mg daily.  Will refill today.  Medications work well with minimal side effects.  Chronic midline low back pain without sciatica Continue management per sports medicine.  He is on Celebrex 100 mg twice daily and Lyrica 200 mg daily.     Subjective:  HPI:  Patient here with rash on his left hand.  This has been present for weeks. OTc clotrimazole did not help.   He also has a mole on his left lateral lower leg.  This popped up over the last few months.  No pain or bleeding.       Objective:  Physical Exam: BP (!) 137/92   Pulse 69   Temp 98 F (36.7 C) (Temporal)   Ht '5\' 11"'$  (1.803 m)   Wt 225 lb 9.6 oz (102.3 kg)   SpO2 98%   BMI 31.46 kg/m   Gen: No acute distress, resting comfortably CV: Regular rate and rhythm with no murmurs appreciated Pulm: Normal work of breathing, clear to auscultation bilaterally with no crackles, wheezes, or rhonchi Skin: Irregular hyperpigmented mole approximately 3 mm in diameter on left lateral knee. Neuro: Grossly normal, moves all extremities Psych: Normal affect and thought content  Procedure note After informed verbal consent was obtained, using Betadine for cleansing and 1% Lidocaine with epinephrine for anesthetic, with sterile technique a 4 mm punch biopsy was used to obtain a biopsy specimen of the lesion. Hemostasis was obtained by pressure and wound was not sutured. Antibiotic dressing is  applied, and wound care instructions provided. Be alert for any signs of cutaneous infection. The specimen is labeled and sent to pathology for evaluation. The procedure was well tolerated without complications.       Algis Greenhouse. [name], MD 02/02/2022 8:47 AM

## 2022-02-09 ENCOUNTER — Ambulatory Visit: Payer: BC Managed Care – PPO | Admitting: Family Medicine

## 2022-02-09 ENCOUNTER — Encounter: Payer: Self-pay | Admitting: Family Medicine

## 2022-02-09 VITALS — BP 130/80 | HR 70 | Temp 97.5°F | Ht 71.0 in | Wt 219.0 lb

## 2022-02-09 DIAGNOSIS — J4540 Moderate persistent asthma, uncomplicated: Secondary | ICD-10-CM | POA: Diagnosis not present

## 2022-02-09 DIAGNOSIS — D239 Other benign neoplasm of skin, unspecified: Secondary | ICD-10-CM

## 2022-02-09 DIAGNOSIS — F902 Attention-deficit hyperactivity disorder, combined type: Secondary | ICD-10-CM | POA: Diagnosis not present

## 2022-02-09 MED ORDER — AZITHROMYCIN 250 MG PO TABS: ORAL_TABLET | ORAL | 0 refills | Status: DC

## 2022-02-09 MED ORDER — PREDNISONE 50 MG PO TABS: ORAL_TABLET | ORAL | 0 refills | Status: DC

## 2022-02-09 NOTE — Assessment & Plan Note (Signed)
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  We discussed referral to dermatology versus biopsy.  He would like to have biopsy done here if possible due to time concerns with referral.  We did discuss deep saucerization versus excisional biopsy.  He will come back soon for elliptical excisional biopsy.

## 2022-02-09 NOTE — Assessment & Plan Note (Signed)
Reassuring exam today though the be in early stages of flare.  He will continue his albuterol.  We will send an empiric prescription for azithromycin and prednisone with instruction not start unless symptoms worsen over the next several days.

## 2022-02-09 NOTE — Assessment & Plan Note (Signed)
Stable on Vyvanse 60 mg daily.  No significant side effects.  Medications help ability stay focused on task.  Does not need refill today.

## 2022-02-09 NOTE — Patient Instructions (Signed)
It was very nice to see you today!  You have an atypical mole.  We need to do a complete excision on this.  Please come back to have this done.  Take care, Dr [name]  PLEASE NOTE:  If you had any lab tests please let us know if you have not heard back within a few days. You may see your results on mychart before we have a chance to review them but we will give you a call once they are reviewed by [place]. If we ordered any referrals today, please let us know if you have not heard from their office within the next week.   Please try these tips to maintain a healthy lifestyle:  Eat at least 3 REAL meals and 1-2 snacks per day.  Aim for no more than 5 hours between eating.  If you eat breakfast, please do so within one hour of getting up.   Each meal should contain half fruits/vegetables, one quarter protein, and one quarter carbs (no bigger than a computer mouse)  Cut down on sweet beverages. This includes juice, soda, and sweet tea.   Drink at least 1 glass of water with each meal and aim for at least 8 glasses per day  Exercise at least 150 minutes every week.

## 2022-02-09 NOTE — Progress Notes (Signed)
Please inform patient of the following:  His biopsy report shows that the lesion we took off was a atypical mole.  This is benign but does have the potential to turn into skin cancer.  Recommend that we do complete excision of the area.  Recommend urgent referral to dermatology for complete excision.  If this cannot be done in a timely manner, then we can do this procedure in our office though he will need to establish with a dermatologist going forward for yearly skin exams.

## 2022-02-09 NOTE — Progress Notes (Signed)
   Brett [name] is a 46 y.o. male who presents today for an office visit.  Assessment/Plan:  Chronic Problems Addressed Today: Attention deficit hyperactivity disorder (ADHD), combined type Stable on Vyvanse 60 mg daily.  No significant side effects.  Medications help ability stay focused on task.  Does not need refill today.  Moderate persistent asthma Reassuring exam today though the be in early stages of flare.  He will continue his albuterol.  We will send an empiric prescription for azithromycin and prednisone with instruction not start unless symptoms worsen over the next several days.  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  We discussed referral to dermatology versus biopsy.  He would like to have biopsy done here if possible due to time concerns with referral.  We did discuss deep saucerization versus excisional biopsy.  He will come back soon for elliptical excisional biopsy.     Subjective:  HPI:  See A/P for status of chronic conditions.  Patient was here a week ago.  We treated him for tinea corporis.  This seems to be improving with treatment prescribed.  We also did a biopsy on mole on his left leg.  Seems to be healing well. He has had a little more cough and congestion recently.  His had several sick contacts.  He is concerned he may be developing an infection.  No reported wheezing or shortness of breath.      Objective:  Physical Exam: BP 130/80   Pulse 70   Temp (!) 97.5 F (36.4 C) (Temporal)   Ht '5\' 11"'$  (1.803 m)   Wt 219 lb (99.3 kg)   SpO2 97%   BMI 30.54 kg/m   Gen: No acute distress, resting comfortably CV: Regular rate and rhythm with no murmurs appreciated Pulm: Normal work of breathing, clear to auscultation bilaterally with no crackles, wheezes, or rhonchi SKin: 4 mm punch biopsy site healing well on left lateral knee. Neuro: Grossly normal, moves all extremities Psych: Normal affect and thought  content      Stepheni Cameron M. [name], MD 02/09/2022 8:32 AM

## 2022-02-11 ENCOUNTER — Ambulatory Visit (INDEPENDENT_AMBULATORY_CARE_PROVIDER_SITE_OTHER): Payer: BC Managed Care – PPO | Admitting: Family Medicine

## 2022-02-11 ENCOUNTER — Encounter: Payer: Self-pay | Admitting: Family Medicine

## 2022-02-11 VITALS — BP 121/74 | HR 66 | Temp 98.2°F | Ht 71.0 in | Wt 217.6 lb

## 2022-02-11 DIAGNOSIS — D239 Other benign neoplasm of skin, unspecified: Secondary | ICD-10-CM

## 2022-02-11 DIAGNOSIS — L988 Other specified disorders of the skin and subcutaneous tissue: Secondary | ICD-10-CM

## 2022-02-11 NOTE — Patient Instructions (Signed)
It was very nice to see you today!  We took off the lesion on your leg today.  Please come back in 10 to 14 days to have your stitches removed.  Please let us know if you have any signs of infection.  Take care, Dr [name]  PLEASE NOTE:  If you had any lab tests please let us know if you have not heard back within a few days. You may see your results on mychart before we have a chance to review them but we will give you a call once they are reviewed by [place]. If we ordered any referrals today, please let us know if you have not heard from their office within the next week.   Please try these tips to maintain a healthy lifestyle:  Eat at least 3 REAL meals and 1-2 snacks per day.  Aim for no more than 5 hours between eating.  If you eat breakfast, please do so within one hour of getting up.   Each meal should contain half fruits/vegetables, one quarter protein, and one quarter carbs (no bigger than a computer mouse)  Cut down on sweet beverages. This includes juice, soda, and sweet tea.   Drink at least 1 glass of water with each meal and aim for at least 8 glasses per day  Exercise at least 150 minutes every week.    Skin Biopsy, Care After The following information offers guidance on how to care for yourself after your procedure. Your health care provider may also give you more specific instructions. If you have problems or questions, contact your health care provider. What can I expect after the procedure? After the procedure, it is common to have: Soreness or mild pain. Bruising. Itching. Some redness and swelling. Follow these instructions at home: Biopsy site care  Follow instructions from your health care provider about how to take care of your biopsy site. Make sure you: Wash your hands with soap and water for at least 20 seconds before and after you change your bandage (dressing). If soap and water are not available, use hand sanitizer. Change your dressing as told by your  health care provider. Leave stitches (sutures), skin glue, or adhesive strips in place. These skin closures may need to stay in place for 2 weeks or longer. If adhesive strip edges start to loosen and curl up, you may trim the loose edges. Do not remove adhesive strips completely unless your health care provider tells you to do that. Check your biopsy site every day for signs of infection. Check for: More redness, swelling, or pain. Fluid or blood. Warmth. Pus or a bad smell. Do not take baths, swim, or use a hot tub until your health care provider approves. Ask your health care provider if you may take showers. You may only be allowed to take sponge baths. General instructions Take over-the-counter and prescription medicines only as told by your health care provider. Return to your normal activities as told by your health care provider. Ask your health care provider what activities are safe for you. Keep all follow-up visits. This is important. Contact a health care provider if: You have more redness, swelling, or pain around your biopsy site. You have fluid or blood coming from your biopsy site. Your biopsy site feels warm to the touch. You have pus or a bad smell coming from your biopsy site. You have a fever. Your sutures, skin glue, or adhesive strips loosen or come off sooner than expected. Get help right  away if: You have bleeding that does not stop with pressure or a dressing. Summary After the procedure, it is common to have soreness, bruising, and itching at the site. Follow instructions from your health care provider about how to take care of your biopsy site. Check your biopsy site every day for signs of infection. Contact a health care provider if you have more redness, swelling, or pain around your biopsy site, or your biopsy site feels warm to the touch. Keep all follow-up visits. This is important. This information is not intended to replace advice given to you by your  health care provider. Make sure you discuss any questions you have with your health care provider. Document Revised: 10/13/2020 Document Reviewed: 10/13/2020 Elsevier Patient Education  [place].

## 2022-02-11 NOTE — Progress Notes (Signed)
   Brett [name] is a 46 y.o. male who presents today for an office visit.  Assessment/Plan:  Dysplastic Nevus Excisional biopsy performed today.  He tolerated well.  See below procedure note.  He will come back in 10 to 14 days for suture removal.  Pathology was sent.  We discussed reasons to return to care and warning signs for infection.     Subjective:  HPI:  Patient here today for biopsy for suspicious skin lesion.  We saw him for this about a week ago and did punch biopsy which showed atypical nevus with atypia.  Margins were involved and he is back here today to have complete excisional biopsy.       Objective:  Physical Exam: BP 121/74   Pulse 66   Temp 98.2 F (36.8 C) (Temporal)   Ht '5\' 11"'$  (1.803 m)   Wt 217 lb 9.6 oz (98.7 kg)   SpO2 97%   BMI 30.35 kg/m   Gen: No acute distress, resting comfortably Skin: 17m punch biopsy site healing well on left lateral knee. Neuro: Grossly normal, moves all extremities Psych: Normal affect and thought content  Excisional Biopsy Procedure Note  Pre-operative Diagnosis: Dysplastic nevi  Post-operative Diagnosis: same  Locations: left lateral knee  Anesthesia: Lidocaine 1% with epinephrine without added sodium bicarbonate  Procedure Details  The risks, benefits, indications, potential complications, and alternatives were explained to the patient and informed consent obtained.  The lesion and surrounding area was given a sterile prep using betadyne and draped in the usual sterile fashion. A scalpel was used to excise an elliptical area of skin approximately 1cm by 3.5cm. The wound was closed with 3-0 Prolene using simple interrupted stitches. Antibiotic ointment and a sterile dressing applied. The specimen was sent for pathologic examination. The patient tolerated the procedure well.  EBL: minimal  Findings: dysplastic nevus  Condition: Stable  Complications:  None  Plan: 1. Instructed to keep the wound dry  and covered for 24-48 hours and clean thereafter. 2. Warning signs of infection were reviewed.   3. Recommended that the patient use OTC analgesics as needed for pain.  4. Return for suture removal in 10 days.      CAlgis Greenhouse PJerline Pain MD 02/11/2022 9:00 AM

## 2022-02-21 ENCOUNTER — Ambulatory Visit (INDEPENDENT_AMBULATORY_CARE_PROVIDER_SITE_OTHER): Payer: BC Managed Care – PPO | Admitting: Family Medicine

## 2022-02-21 ENCOUNTER — Encounter: Payer: Self-pay | Admitting: Family Medicine

## 2022-02-21 VITALS — BP 126/83 | HR 70 | Temp 98.0°F | Ht 71.0 in | Wt 226.4 lb

## 2022-02-21 DIAGNOSIS — Z4802 Encounter for removal of sutures: Secondary | ICD-10-CM

## 2022-02-21 NOTE — Progress Notes (Signed)
   Brett [name] is a 46 y.o. male who presents today for an office visit.  Assessment/Plan:  Wound Check Doing well.  No signs of infection.  He does feel like the area has not fully healed and will like to resume his normal activities with exercise and martial arts.  We will reinforce the area today with Dermabond.  We discussed care after.  We discussed reasons to return to care.  Follow-up as needed.    Subjective:  HPI:  Patient here for follow-up.  Seen 2 days ago for excisional biopsy.  Is done well.  Still feels a bit of tugging and pulling at his site of surgery..  Minimal pain.  Minimal bleeding.       Objective:  Physical Exam: BP 126/83   Pulse 70   Temp 98 F (36.7 C) (Temporal)   Ht '5\' 11"'$  (1.803 m)   Wt 226 lb 6.4 oz (102.7 kg)   SpO2 96%   BMI 31.58 kg/m   Gen: No acute distress, resting comfortably Skin: Healing 3.5cm laceration on left lateral knee with 4 interrupted sutures in place. No visible dehiscence. No drainage or erythema.  Neuro: Grossly normal, moves all extremities Psych: Normal affect and thought content  Verbal consent obtained.  4 simple interrupted sutures were removed without difficulty.  Dermabond was then applied to residual wound.  He tolerated well.      Algis Greenhouse. [name], MD 02/21/2022 8:44 AM

## 2022-02-21 NOTE — Patient Instructions (Signed)
It was very nice to see you today!  We removed your stitches today and we should have pathology results back soon.  We placed skin glue on your wound.  This should fall off within the next 7 to 10 days.  Take care, Dr [name]  PLEASE NOTE:  If you had any lab tests please let us know if you have not heard back within a few days. You may see your results on mychart before we have a chance to review them but we will give you a call once they are reviewed by [place]. If we ordered any referrals today, please let us know if you have not heard from their office within the next week.   Please try these tips to maintain a healthy lifestyle:  Eat at least 3 REAL meals and 1-2 snacks per day.  Aim for no more than 5 hours between eating.  If you eat breakfast, please do so within one hour of getting up.   Each meal should contain half fruits/vegetables, one quarter protein, and one quarter carbs (no bigger than a computer mouse)  Cut down on sweet beverages. This includes juice, soda, and sweet tea.   Drink at least 1 glass of water with each meal and aim for at least 8 glasses per day  Exercise at least 150 minutes every week.    Tissue Adhesive Wound Care Some cuts and wounds can be closed with skin glue (tissue adhesive). Skin glue holds the skin together and helps your wound heal faster. Skin glue goes away on its own as your wound gets better. It is important to take good care of your wound while it heals. Follow these instructions at home: Wound care     If a bandage (dressing) was put on the wound, keep it clean and dry. Follow instructions from your doctor about how often to change the bandage. Make sure you: Wash your hands with soap and water for at least 20 seconds before and after you change your bandage. If you cannot use soap and water, use hand sanitizer. Change the bandage as often as told by your doctor. Leave skin glue in place. It will fall off on its own after 7-10 days. Do  not scratch, rub, or pick at the skin glue. Do not put tape over the skin glue. The skin glue could come off when you take the tape off. Check your wound daily to make sure it is not starting to reopen. Protect the wound from another injury. Check your wound area every day for signs of infection. Check for: More redness, swelling, or pain. Fluid or blood. Warmth or a rash around the wound. Pus or a bad smell. Bathing Do not take baths, swim, or use a hot tub. Ask your doctor about taking showers or sponge baths. You can usually shower after the first 24 hours. Cover the bandage with a watertight covering when you take a shower. Do not soak the area where skin glue has been used. Do not use soaps or put creams on your wound. Eating and drinking Eat healthy foods to help the wound heal. As told by your doctor, eat: Foods rich in protein. These include meat, fish, eggs, dairy, beans, and nuts. Foods rich in vitamin A. These include carrots and dark green, leafy vegetables. Foods rich in vitamin C. These include oranges, tomatoes, broccoli, and peppers. Drink enough fluid to keep your pee (urine) pale yellow. General instructions Protect your wound from the sun when you  are outside for the first 6 months, or for as long as told by your doctor. Put on sunscreen with an SPF of 30 or higher around the scar, or cover it up. Take over-the-counter and prescription medicines only as told by your doctor. Do not smoke or use any products that contain nicotine or tobacco. These can delay wound healing. If you need help quitting, ask your doctor. Keep all follow-up visits. Contact a doctor if: The glue used on your wound gets soaked with blood or falls off before your wound has healed. The glue may need to be replaced. You have a fever or chills. You have redness, swelling, or pain around your wound. You have fluid or blood coming from your wound. You get a rash after the glue is put on. Get help  right away if: Your wound breaks open. You have a red streak at the area around your wound. You have pus or a bad smell coming from your wound. Summary Some cuts and wounds can be closed with skin glue (tissue adhesive). Skin glue holds the skin together and helps your wound heal faster. It is important to take good care of your wound while it heals. Wash your hands with soap and water for at least 20 seconds before and after you change your bandage. Eat healthy foods. Check your wound every day for signs of infection. This information is not intended to replace advice given to you by your health care provider. Make sure you discuss any questions you have with your health care provider. Document Revised: 07/20/2020 Document Reviewed: 07/20/2020 Elsevier Patient Education  [place].

## 2022-02-23 NOTE — Progress Notes (Signed)
Please inform patient of the following:  Great news! The biopsy report shows that all of his atypical mole was removed. Do not need to do any further procedures.  Brett [name]. [name], MD 02/23/2022 7:28 AM

## 2022-02-24 NOTE — Progress Notes (Signed)
[name] [place] Phone: [phone] Subjective:   Brett [name], am serving as a scribe for Dr. Hulan Saas.  I'm seeing this patient by the request  of:  Vivi Barrack, MD  CC: Back and neck pain follow-up  [id number]  Brett [name] is a 46 y.o. male coming in with complaint of back and neck pain. OMT 01/13/2022. Patient states that he is needing an adjustment his knee is still healing from surgery and is unable to do much movement because the stitches keep popping she he is really tight. Left shoulder was doing really well after his injection, but was thrown down on his shoulder during [place].  Medications patient has been prescribed: None  Taking:         Reviewed prior external information including notes and imaging from previsou exam, outside providers and external EMR if available.   As well as notes that were available from care everywhere and other healthcare systems.  Past medical history, social, surgical and family history all reviewed in electronic medical record.  No pertanent information unless stated regarding to the chief complaint.   Past Medical History:  Diagnosis Date   Asthma    Bipolar 1 disorder ([place]) 06/15/2016   Previously followed by Psychiatry. Hx of "anger issues." Hx of medication use has included Seroquel and Ativan, both of which have helped him in the past.    Chronic pain of both knees 06/15/2016   Chronic seasonal allergic rhinitis due to pollen 06/15/2016   Depression    Gastroesophageal reflux disease without esophagitis 06/15/2016   History of diabetes mellitus 05/17/2019   Resolved after > 100 pound weight loss and lifestyle changes. 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 No ongoing issues.   Morbid obesity ([place]) 06/15/2016    No  Known Allergies   Review of Systems:  No headache, visual changes, nausea, vomiting, diarrhea, constipation, dizziness, abdominal pain, skin rash, fevers, chills, night sweats, weight loss, swollen lymph nodes, body aches, joint swelling, chest pain, shortness of breath, mood changes. POSITIVE muscle aches  Objective  Blood pressure 110/80, pulse 83, height '5\' 11"'$  (1.803 m), weight 228 lb (103.4 kg), SpO2 98 %.   General: No apparent distress alert and oriented x3 mood and affect normal, dressed appropriately.  HEENT: Pupils equal, extraocular movements intact  Respiratory: Patient's speak in full sentences and does not appear short of breath  Cardiovascular: No lower extremity edema, non tender, no erythema  Low back exam does have some loss of lordosis.  Some worsening limited range of motion of the back noted.  Seems to be more on the left greater than the right at the sacroiliac joint today.  Tightness with [place].  Lacks last 15 degrees of extension.  Osteopathic findings  C2 flexed rotated and side bent right C5 flexed rotated and side bent left T5 extended rotated and side bent right inhaled rib L2 flexed rotated and side bent left L5 flexed rotated and side bent right Sacrum left on the left       Assessment and Plan:  Somatic dysfunction of spine, lumbar Chronic, with worsening symptoms.  Discussed icing regimen and home exercises, discussed HEP, hip abductors discussed ergonomics throughout the day if possible as well.  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  Still having difficulty healing that as well.    Nonallopathic problems  Decision today to treat with OMT was based on Physical Exam  After verbal consent patient was treated with HVLA, ME, FPR techniques in cervical, rib, thoracic, lumbar, and sacral  areas  Patient tolerated the procedure well with  improvement in symptoms  Patient given exercises, stretches and lifestyle modifications  See medications in patient instructions if given  Patient will follow up in 4-8 weeks             Note: This dictation was prepared with Dragon dictation along with smaller phrase technology. Any transcriptional errors that result from this process are unintentional.

## 2022-02-28 ENCOUNTER — Ambulatory Visit (INDEPENDENT_AMBULATORY_CARE_PROVIDER_SITE_OTHER): Payer: BC Managed Care – PPO | Admitting: Family Medicine

## 2022-02-28 VITALS — BP 110/80 | HR 83 | Ht 71.0 in | Wt 228.0 lb

## 2022-02-28 DIAGNOSIS — M9902 Segmental and somatic dysfunction of thoracic region: Secondary | ICD-10-CM

## 2022-02-28 DIAGNOSIS — M9904 Segmental and somatic dysfunction of sacral region: Secondary | ICD-10-CM | POA: Diagnosis not present

## 2022-02-28 DIAGNOSIS — M9901 Segmental and somatic dysfunction of cervical region: Secondary | ICD-10-CM | POA: Diagnosis not present

## 2022-02-28 DIAGNOSIS — M9903 Segmental and somatic dysfunction of lumbar region: Secondary | ICD-10-CM

## 2022-02-28 DIAGNOSIS — M9908 Segmental and somatic dysfunction of rib cage: Secondary | ICD-10-CM | POA: Diagnosis not present

## 2022-02-28 DIAGNOSIS — M5030 Other cervical disc degeneration, unspecified cervical region: Secondary | ICD-10-CM | POA: Diagnosis not present

## 2022-02-28 NOTE — Assessment & Plan Note (Addendum)
Chronic, with worsening symptoms.  Discussed icing regimen and home exercises, discussed HEP, hip abductors discussed ergonomics throughout the day if possible as well.  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  Still having difficulty healing that as well.  Discussed with patient about the Robaxin 750 mg up to twice a day as well as the Celebrex 100 mg twice a day.

## 2022-02-28 NOTE — Patient Instructions (Addendum)
Good to see you  Get back in the gym where I am sure you will be happier  Follow up in 8-10 weeks

## 2022-03-03 ENCOUNTER — Encounter: Payer: Self-pay | Admitting: Family Medicine

## 2022-03-03 DIAGNOSIS — E291 Testicular hypofunction: Secondary | ICD-10-CM

## 2022-03-04 MED ORDER — TESTOSTERONE CYPIONATE 200 MG/ML IM SOLN: INTRAMUSCULAR | 5 refills | Status: DC

## 2022-03-07 ENCOUNTER — Other Ambulatory Visit: Payer: Self-pay | Admitting: Family Medicine

## 2022-03-07 ENCOUNTER — Other Ambulatory Visit (HOSPITAL_BASED_OUTPATIENT_CLINIC_OR_DEPARTMENT_OTHER): Payer: Self-pay

## 2022-03-07 ENCOUNTER — Telehealth: Payer: Self-pay | Admitting: *Deleted

## 2022-03-07 NOTE — Telephone Encounter (Signed)
(  [facility] - [id number] Lisdexamfetamine Dimesylate '60MG'$  capsules Status: PA Request Sent: December 11th, 2023 Waiting for determination

## 2022-03-08 ENCOUNTER — Other Ambulatory Visit (HOSPITAL_BASED_OUTPATIENT_CLINIC_OR_DEPARTMENT_OTHER): Payer: Self-pay

## 2022-03-08 MED ORDER — LISDEXAMFETAMINE DIMESYLATE 60 MG PO CAPS
60.0000 mg | ORAL_CAPSULE | ORAL | 0 refills | Status: DC
  Filled 2022-03-08: qty 30, 30d supply, fill #0

## 2022-03-08 NOTE — Telephone Encounter (Signed)
As long as you remain covered by the [facility] and there are no changes to your plan benefits, this request is approved for the following time period: 03/07/2022 - 03/07/2025 [facility] Notified

## 2022-03-14 ENCOUNTER — Other Ambulatory Visit (HOSPITAL_BASED_OUTPATIENT_CLINIC_OR_DEPARTMENT_OTHER): Payer: Self-pay

## 2022-04-12 ENCOUNTER — Other Ambulatory Visit: Payer: Self-pay | Admitting: Family Medicine

## 2022-04-12 ENCOUNTER — Other Ambulatory Visit (HOSPITAL_BASED_OUTPATIENT_CLINIC_OR_DEPARTMENT_OTHER): Payer: Self-pay

## 2022-04-12 MED ORDER — LISDEXAMFETAMINE DIMESYLATE 60 MG PO CAPS
60.0000 mg | ORAL_CAPSULE | ORAL | 0 refills | Status: DC
  Filled 2022-04-12: qty 30, 30d supply, fill #0

## 2022-04-20 ENCOUNTER — Other Ambulatory Visit (HOSPITAL_BASED_OUTPATIENT_CLINIC_OR_DEPARTMENT_OTHER): Payer: Self-pay

## 2022-04-21 NOTE — Progress Notes (Signed)
Brett [name] Phone: [phone] Subjective:   Brett [name], am serving as a scribe for Dr. Hulan Saas.  I'm seeing this patient by the request  of:  Vivi Barrack, MD  CC: Neck and back pain follow-up  [id number]  Brett [name] is a 47 y.o. male coming in with complaint of back and neck pain. OMT 02/28/2022. Patient states that his hips feel tight all the time and stretching is not helping. Constant low back pain.   L knee will give out on him intermittently. Injections have helped knee pain.   Also c/o R calf cramp for the past few weeks.   Medications patient has been prescribed: None  Taking:         Reviewed prior external information including notes and imaging from previsou exam, outside providers and external EMR if available.   As well as notes that were available from care everywhere and other healthcare systems.  Past medical history, social, surgical and family history all reviewed in electronic medical record.  [name] pertanent information unless stated regarding to the chief complaint.   Past Medical History:  Diagnosis Date   Asthma    Bipolar 1 disorder ([place]) 06/15/2016   Previously followed by Psychiatry. Hx of "anger issues." Hx of medication use has included Seroquel and Ativan, both of which have helped him in the past.    Chronic pain of both knees 06/15/2016   Chronic seasonal allergic rhinitis due to pollen 06/15/2016   Depression    Gastroesophageal reflux disease without esophagitis 06/15/2016   History of diabetes mellitus 05/17/2019   Resolved after > 100 pound weight loss and lifestyle changes. 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 [name] ongoing issues.   Morbid obesity ([place]) 06/15/2016    [name] Known Allergies   Review of Systems:  [name]  headache, visual changes, nausea, vomiting, diarrhea, constipation, dizziness, abdominal pain, skin rash, fevers, chills, night sweats, weight loss, swollen lymph nodes, body aches, joint swelling, chest pain, shortness of breath, mood changes. POSITIVE muscle aches  Objective  Blood pressure (!) 132/96, pulse 82, height '5\' 11"'$  (1.803 m), weight 221 lb (100.2 kg).   General: [name] apparent distress alert and oriented x3 mood and affect normal, dressed appropriately.  HEENT: Pupils equal, extraocular movements intact  Respiratory: Patient's speak in full sentences and does not appear short of breath  Cardiovascular: [name] lower extremity edema, non tender, [name] erythema  Neck exam does have some mild loss of lordosis.  Low back does have significant loss of lordosis with significant tightness of the paraspinal musculature of the lumbar spine right greater than left.  Right knee exam shows the patient does have some crepitus noted.  Some tenderness to palpation on the left knee as well.  Seems to have discomfort as well as some instability with valgus and varus stress  Osteopathic findings  C2 flexed rotated and side bent right C7 flexed rotated and side bent left T3 extended rotated and side bent right inhaled rib T9 extended rotated and side bent left L3 flexed rotated and side bent right L5 flexed rotated and side bent left Sacrum right on right   After informed written and verbal consent, patient was seated on exam table. Left knee was prepped with alcohol swab and utilizing anterolateral approach, patient's left  knee space was injected with 4:1  marcaine 0.5%: Kenalog '40mg'$ /dL. Patient tolerated the procedure well without immediate complications.    Assessment and Plan:  Unilateral primary osteoarthritis, left knee Chronic problem with worsening symptoms.  Discussed the possibility of viscosupplementation as well as PRP.  Discussed home exercises and icing regimen.  Increase activity slowly.   Follow-up again in 6 to 8 weeks  Chronic midline low back pain without sciatica Chronic problem with exacerbation as well.  Known degenerative disc disease.  Still has tightness of the hip flexors bilaterally.  Responds well to osteopathic manipulation.  Will continue to work on the core strengthening which patient has done incredibly well.  Worsening pain can consider the possibility of epidurals.  Follow-up again in 6 to 8 weeks  Right calf pain Intermittent pain that has been going on for relatively few weeks.  We discussed with [name] significant travel history notable for certain type of blood clot but patient knows if any worsening pain or swelling to seek medical attention immediately.  Heel lifts, compression, Graston tool encouraged.  Follow-up again in 4 to 6 weeks    Nonallopathic problems  Decision today to treat with OMT was based on Physical Exam  After verbal consent patient was treated with HVLA, ME, FPR techniques in cervical, rib, thoracic, lumbar, and sacral  areas  Patient tolerated the procedure well with improvement in symptoms  Patient given exercises, stretches and lifestyle modifications  See medications in patient instructions if given  Patient will follow up in 4-8 weeks     The above documentation has been reviewed and is accurate and complete [name], DO         Note: This dictation was prepared with Dragon dictation along with smaller phrase technology. Any transcriptional errors that result from this process are unintentional.

## 2022-04-25 ENCOUNTER — Ambulatory Visit (INDEPENDENT_AMBULATORY_CARE_PROVIDER_SITE_OTHER): Payer: BC Managed Care – PPO | Admitting: Family Medicine

## 2022-04-25 ENCOUNTER — Encounter: Payer: Self-pay | Admitting: Family Medicine

## 2022-04-25 VITALS — BP 132/96 | HR 82 | Ht 71.0 in | Wt 221.0 lb

## 2022-04-25 DIAGNOSIS — M9908 Segmental and somatic dysfunction of rib cage: Secondary | ICD-10-CM

## 2022-04-25 DIAGNOSIS — M9901 Segmental and somatic dysfunction of cervical region: Secondary | ICD-10-CM | POA: Diagnosis not present

## 2022-04-25 DIAGNOSIS — M9904 Segmental and somatic dysfunction of sacral region: Secondary | ICD-10-CM | POA: Diagnosis not present

## 2022-04-25 DIAGNOSIS — M9902 Segmental and somatic dysfunction of thoracic region: Secondary | ICD-10-CM | POA: Diagnosis not present

## 2022-04-25 DIAGNOSIS — M9903 Segmental and somatic dysfunction of lumbar region: Secondary | ICD-10-CM

## 2022-04-25 DIAGNOSIS — M79661 Pain in right lower leg: Secondary | ICD-10-CM

## 2022-04-25 DIAGNOSIS — M545 Low back pain, unspecified: Secondary | ICD-10-CM | POA: Diagnosis not present

## 2022-04-25 DIAGNOSIS — G8929 Other chronic pain: Secondary | ICD-10-CM

## 2022-04-25 DIAGNOSIS — M1712 Unilateral primary osteoarthritis, left knee: Secondary | ICD-10-CM | POA: Diagnosis not present

## 2022-04-25 NOTE — Assessment & Plan Note (Signed)
Intermittent pain that has been going on for relatively few weeks.  We discussed with no significant travel history notable for certain type of blood clot but patient knows if any worsening pain or swelling to seek medical attention immediately.  Heel lifts, compression, Graston tool encouraged.  Follow-up again in 4 to 6 weeks

## 2022-04-25 NOTE — Assessment & Plan Note (Signed)
Chronic problem with exacerbation as well.  Known degenerative disc disease.  Still has tightness of the hip flexors bilaterally.  Responds well to osteopathic manipulation.  Will continue to work on the core strengthening which patient has done incredibly well.  Worsening pain can consider the possibility of epidurals.  Follow-up again in 6 to 8 weeks

## 2022-04-25 NOTE — Assessment & Plan Note (Signed)
Chronic problem with worsening symptoms.  Discussed the possibility of viscosupplementation as well as PRP.  Discussed home exercises and icing regimen.  Increase activity slowly.  Follow-up again in 6 to 8 weeks

## 2022-04-25 NOTE — Patient Instructions (Addendum)
Injected knees today 1/8th inch heel lift in shoes Graston tool for calf  Consider calf compression when working out See you again in 4-6 weeks

## 2022-04-26 ENCOUNTER — Other Ambulatory Visit: Payer: Self-pay | Admitting: Family Medicine

## 2022-04-26 DIAGNOSIS — M2550 Pain in unspecified joint: Secondary | ICD-10-CM

## 2022-05-18 ENCOUNTER — Other Ambulatory Visit: Payer: Self-pay | Admitting: Family Medicine

## 2022-05-18 ENCOUNTER — Encounter: Payer: Self-pay | Admitting: Family Medicine

## 2022-05-19 ENCOUNTER — Other Ambulatory Visit (HOSPITAL_BASED_OUTPATIENT_CLINIC_OR_DEPARTMENT_OTHER): Payer: Self-pay

## 2022-05-19 MED ORDER — LISDEXAMFETAMINE DIMESYLATE 60 MG PO CAPS
60.0000 mg | ORAL_CAPSULE | ORAL | 0 refills | Status: DC
  Filled 2022-05-19 – 2022-05-30 (×2): qty 30, 30d supply, fill #0

## 2022-05-26 ENCOUNTER — Encounter (HOSPITAL_BASED_OUTPATIENT_CLINIC_OR_DEPARTMENT_OTHER): Payer: Self-pay

## 2022-05-26 ENCOUNTER — Other Ambulatory Visit (HOSPITAL_BASED_OUTPATIENT_CLINIC_OR_DEPARTMENT_OTHER): Payer: Self-pay

## 2022-05-30 ENCOUNTER — Other Ambulatory Visit (HOSPITAL_BASED_OUTPATIENT_CLINIC_OR_DEPARTMENT_OTHER): Payer: Self-pay

## 2022-06-01 NOTE — Progress Notes (Unsigned)
[place] Phone: [phone] Subjective:   Brett [name], am serving as a scribe for Dr. Hulan Saas.  I'm seeing this patient by the request  of:  Vivi Barrack, MD  CC: back and neck pain   [id number]  Brett [name] is a 47 y.o. male coming in with complaint of back and neck pain. OMT 04/25/2022. Also f/u for L knee pain. Patient states that his back is doing ok. Both knees seem to be unstable. He said that the L knee is typically worse but recently the R is grinding and popping more.   Also c/o L shoulder pain over AC joint. [name] new injury.   Medications patient has been prescribed: None  Taking:         Reviewed prior external information including notes and imaging from previsou exam, outside providers and external EMR if available.   As well as notes that were available from care everywhere and other healthcare systems.  Past medical history, social, surgical and family history all reviewed in electronic medical record.  [name] pertanent information unless stated regarding to the chief complaint.   Past Medical History:  Diagnosis Date   Asthma    Bipolar 1 disorder ([place]) 06/15/2016   Previously followed by Psychiatry. Hx of "anger issues." Hx of medication use has included Seroquel and Ativan, both of which have helped him in the past.    Chronic pain of both knees 06/15/2016   Chronic seasonal allergic rhinitis due to pollen 06/15/2016   Depression    Gastroesophageal reflux disease without esophagitis 06/15/2016   History of diabetes mellitus 05/17/2019   Resolved after > 100 pound weight loss and lifestyle changes. 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 [name] ongoing issues.   Morbid obesity ([place]) 06/15/2016    [name] Known Allergies   Review of Systems:  [name]  headache, visual changes, nausea, vomiting, diarrhea, constipation, dizziness, abdominal pain, skin rash, fevers, chills, night sweats, weight loss, swollen lymph nodes, body aches, joint swelling, chest pain, shortness of breath, mood changes. POSITIVE muscle aches  Objective  Blood pressure 116/88, pulse 73, height '5\' 11"'$  (1.803 m), weight 222 lb (100.7 kg), SpO2 97 %.   General: [name] apparent distress alert and oriented x3 mood and affect normal, dressed appropriately.  HEENT: Pupils equal, extraocular movements intact  Respiratory: Patient's speak in full sentences and does not appear short of breath  Cardiovascular: [name] lower extremity edema, non tender, [name] erythema  Back exam does have significant loss of lordosis.  Patient does have some limited sidebending.  Tightness with FABER test right greater than left. Right knee does have arthritic changes so relatively similar to the left knee but does have more of the patellofemoral crepitus noted.  Does have lateral tracking of the patella noted.  Osteopathic findings  C7 flexed rotated and side bent left T3 extended rotated and side bent right inhaled rib T9 extended rotated and side bent left L2 flexed rotated and side bent right L4 flexed rotated and side bent right Sacrum right on right       Assessment and Plan:  Unilateral primary osteoarthritis, left knee Known arthritic changes and likely contributing to some of the contralateral side pain.  Discussed that she would pull lite brace but patient wanted to see if he  could do more of the over-the-counter 1.  Discussed icing regimen and home exercises, discussed which activities to do and which ones to avoid.  Follow-up again in 6 to 8 weeks otherwise.    Nonallopathic problems  Decision today to treat with OMT was based on Physical Exam  After verbal consent patient was treated with HVLA, ME, FPR techniques in cervical, rib, thoracic, lumbar, and sacral  areas  Patient  tolerated the procedure well with improvement in symptoms  Patient given exercises, stretches and lifestyle modifications  See medications in patient instructions if given  Patient will follow up in 4-8 weeks      The above documentation has been reviewed and is accurate and complete [name], DO        Note: This dictation was prepared with Dragon dictation along with smaller phrase technology. Any transcriptional errors that result from this process are unintentional.

## 2022-06-02 ENCOUNTER — Encounter: Payer: Self-pay | Admitting: Family Medicine

## 2022-06-02 ENCOUNTER — Ambulatory Visit: Payer: BC Managed Care – PPO | Admitting: Family Medicine

## 2022-06-02 VITALS — BP 116/88 | HR 73 | Ht 71.0 in | Wt 222.0 lb

## 2022-06-02 DIAGNOSIS — M1712 Unilateral primary osteoarthritis, left knee: Secondary | ICD-10-CM

## 2022-06-02 DIAGNOSIS — M9904 Segmental and somatic dysfunction of sacral region: Secondary | ICD-10-CM | POA: Diagnosis not present

## 2022-06-02 DIAGNOSIS — M9901 Segmental and somatic dysfunction of cervical region: Secondary | ICD-10-CM | POA: Diagnosis not present

## 2022-06-02 DIAGNOSIS — M9903 Segmental and somatic dysfunction of lumbar region: Secondary | ICD-10-CM

## 2022-06-02 DIAGNOSIS — M9908 Segmental and somatic dysfunction of rib cage: Secondary | ICD-10-CM

## 2022-06-02 DIAGNOSIS — M9902 Segmental and somatic dysfunction of thoracic region: Secondary | ICD-10-CM

## 2022-06-02 NOTE — Patient Instructions (Signed)
Injected AC jt today Tru-pull lite by [employer] See me again in 7-8 weeks

## 2022-06-02 NOTE — Assessment & Plan Note (Signed)
Known arthritic changes and likely contributing to some of the contralateral side pain.  Discussed that she would pull lite brace but patient wanted to see if he could do more of the over-the-counter 1.  Discussed icing regimen and home exercises, discussed which activities to do and which ones to avoid.  Follow-up again in 6 to 8 weeks otherwise.

## 2022-06-08 ENCOUNTER — Encounter: Payer: Self-pay | Admitting: Gastroenterology

## 2022-06-14 ENCOUNTER — Ambulatory Visit (AMBULATORY_SURGERY_CENTER): Payer: BC Managed Care – PPO | Admitting: *Deleted

## 2022-06-14 ENCOUNTER — Encounter: Payer: Self-pay | Admitting: Gastroenterology

## 2022-06-14 VITALS — Ht 71.0 in | Wt 215.0 lb

## 2022-06-14 DIAGNOSIS — Z1211 Encounter for screening for malignant neoplasm of colon: Secondary | ICD-10-CM

## 2022-06-14 MED ORDER — NA SULFATE-K SULFATE-MG SULF 17.5-3.13-1.6 GM/177ML PO SOLN: 1.0000 | Freq: Once | ORAL | 0 refills | Status: AC

## 2022-06-14 NOTE — Progress Notes (Signed)
Pt's previsit is done over the phone and all paperwork (prep instructions) sent to patient. Pt's name and DOB verified at the beginning of the previsit. Pt denies any difficulty with ambulating.  No egg or soy allergy known to patient  No issues known to pt with past sedation with any surgeries or procedures Patient denies having issue being intubated No FH of Malignant Hyperthermia Pt is not on diet pills Pt is not on  home 02  Pt is not on blood thinners  Pt denies issues with constipation  Pt is not on dialysis Pt denies any upcoming cardiac testing Pt encouraged to use to use Singlecare or Goodrx to reduce cost  Patient's chart reviewed by Osvaldo Angst CNRA prior to previsit and patient appropriate for the [place].  Previsit completed and red dot placed by patient's name on their procedure day (on provider's schedule).  . Visit by phone Pt states her weight is 215lb Instructions reviewed with pt and pt states understanding. Instructed to review again prior to procedure. Pt states they will.  Instructions sent by mail with coupon and by my chart

## 2022-06-18 ENCOUNTER — Other Ambulatory Visit: Payer: Self-pay | Admitting: Family Medicine

## 2022-06-18 DIAGNOSIS — F3181 Bipolar II disorder: Secondary | ICD-10-CM

## 2022-06-27 ENCOUNTER — Other Ambulatory Visit: Payer: Self-pay | Admitting: Family Medicine

## 2022-06-27 NOTE — Telephone Encounter (Signed)
Last OV: 02/21/22  Next OV: 08/10/22  Last filled: 12/30/21  Quantity: 30 w/ 0 refills

## 2022-06-28 ENCOUNTER — Other Ambulatory Visit: Payer: Self-pay

## 2022-06-28 ENCOUNTER — Other Ambulatory Visit (HOSPITAL_BASED_OUTPATIENT_CLINIC_OR_DEPARTMENT_OTHER): Payer: Self-pay

## 2022-06-28 MED ORDER — LISDEXAMFETAMINE DIMESYLATE 60 MG PO CAPS
60.0000 mg | ORAL_CAPSULE | ORAL | 0 refills | Status: DC
  Filled 2022-06-28: qty 30, 30d supply, fill #0

## 2022-07-03 ENCOUNTER — Encounter: Payer: Self-pay | Admitting: Family Medicine

## 2022-07-03 DIAGNOSIS — G629 Polyneuropathy, unspecified: Secondary | ICD-10-CM

## 2022-07-04 MED ORDER — PREGABALIN 200 MG PO CAPS: 200.0000 mg | ORAL_CAPSULE | Freq: Two times a day (BID) | ORAL | 1 refills | Status: DC

## 2022-07-05 ENCOUNTER — Encounter: Payer: Self-pay | Admitting: Gastroenterology

## 2022-07-05 ENCOUNTER — Ambulatory Visit (AMBULATORY_SURGERY_CENTER): Payer: BC Managed Care – PPO | Admitting: Gastroenterology

## 2022-07-05 VITALS — BP 130/79 | HR 72 | Temp 97.3°F | Resp 16 | Ht 71.0 in | Wt 215.0 lb

## 2022-07-05 DIAGNOSIS — Z1211 Encounter for screening for malignant neoplasm of colon: Secondary | ICD-10-CM | POA: Diagnosis present

## 2022-07-05 NOTE — Patient Instructions (Signed)
YOU HAD AN ENDOSCOPIC PROCEDURE TODAY AT THE [facility]:   Refer to the procedure report that was given to you for any specific questions about what was found during the examination.  If the procedure report does not answer your questions, please call your gastroenterologist to clarify.  If you requested that your care partner not be given the details of your procedure findings, then the procedure report has been included in a sealed envelope for you to review at your convenience later.  YOU SHOULD EXPECT: Some feelings of bloating in the abdomen. Passage of more gas than usual.  Walking can help get rid of the air that was put into your GI tract during the procedure and reduce the bloating. If you had a lower endoscopy (such as a colonoscopy or flexible sigmoidoscopy) you may notice spotting of blood in your stool or on the toilet paper. If you underwent a bowel prep for your procedure, you may not have a normal bowel movement for a few days.  Please Note:  You might notice some irritation and congestion in your nose or some drainage.  This is from the oxygen used during your procedure.  There is no need for concern and it should clear up in a day or so.  SYMPTOMS TO REPORT IMMEDIATELY:  Following lower endoscopy (colonoscopy or flexible sigmoidoscopy):  Excessive amounts of blood in the stool  Significant tenderness or worsening of abdominal pains  Swelling of the abdomen that is new, acute  Fever of 100F or higher  For urgent or emergent issues, a gastroenterologist can be reached at any hour by calling [phone]. Do not use MyChart messaging for urgent concerns.    DIET:  We do recommend a small meal at first, but then you may proceed to your regular diet.  Drink plenty of fluids but you should avoid alcoholic beverages for 24 hours.  ACTIVITY:  You should plan to take it easy for the rest of today and you should NOT DRIVE or use heavy machinery until tomorrow (because of  the sedation medicines used during the test).    FOLLOW UP: Our staff will call the number listed on your records the next business day following your procedure.  We will call around 7:15- 8:00 am to check on you and address any questions or concerns that you may have regarding the information given to you following your procedure. If we do not reach you, we will leave a message.      SIGNATURES/CONFIDENTIALITY: You and/or your care partner have signed paperwork which will be entered into your electronic medical record.  These signatures attest to the fact that that the information above on your After Visit Summary has been reviewed and is understood.  Full responsibility of the confidentiality of this discharge information lies with you and/or your care-partner. 

## 2022-07-05 NOTE — Progress Notes (Signed)
Gastroenterology History and Physical   Primary Care Physician:  Ardith Dark, MD   Reason for Procedure:  Colorectal cancer screening  Plan:    Screening colonoscopy with possible interventions as needed     HPI: Brett [name] is a very pleasant 47 y.o. male here for screening colonoscopy. Denies any nausea, vomiting, abdominal pain, melena or bright red blood per rectum  The risks and benefits as well as alternatives of endoscopic procedure(s) have been discussed and reviewed. All questions answered. The patient agrees to proceed.    Past Medical History:  Diagnosis Date   Asthma    Bipolar 1 disorder (HCC) 06/15/2016   Previously followed by Psychiatry. Hx of "anger issues." Hx of medication use has included Seroquel and Ativan, both of which have helped him in the past.    Chronic pain of both knees 06/15/2016   Chronic seasonal allergic rhinitis due to pollen 06/15/2016   Depression    Gastroesophageal reflux disease without esophagitis 06/15/2016   History of diabetes mellitus 05/17/2019   Resolved after > 100 pound weight loss and lifestyle changes. 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 No ongoing issues.   Morbid obesity (HCC) 06/15/2016    Past Surgical History:  Procedure Laterality Date   BICEPS TENDON REPAIR     PANNICULECTOMY  11/15/2018   loose skin removal surgery    Prior to Admission medications   Medication Sig Start Date End Date Taking? Authorizing Provider  albuterol (VENTOLIN HFA) 108 (90 Base) MCG/ACT inhaler INHALE 2 PUFFS BY MOUTH EVERY 6 HOURS AS NEEDED FOR WHEEZE OR SHORTNESS OF BREATH 12/30/21   Willow Ora, MD  buPROPion (WELLBUTRIN XL) 300 MG 24 hr tablet TAKE 1 TABLET BY MOUTH EVERY DAY 06/20/22   Ardith Dark, MD  celecoxib (CELEBREX) 100 MG capsule TAKE 1 CAPSULE BY MOUTH TWICE A DAY AS NEEDED  04/26/22   Ardith Dark, MD  lisdexamfetamine (VYVANSE) 60 MG capsule Take 1 capsule (60 mg total) by mouth every morning. 06/28/22   Ardith Dark, MD  MAGNESIUM CITRATE PO Take 1 tablet by mouth every evening.    [provider]  methocarbamol (ROBAXIN) 750 MG tablet Take 1 tablet (750 mg total) by mouth 2 (two) times daily as needed for muscle spasms. Patient not taking: Reported on 06/14/2022 05/05/21   Willow Ora, MD  pregabalin (LYRICA) 200 MG capsule Take 1 capsule (200 mg total) by mouth 2 (two) times daily. 07/04/22   Ardith Dark, MD  testosterone cypionate (DEPOTESTOSTERONE CYPIONATE) 200 MG/ML injection ALTERNATE 0.5 ML (100MG ) UNDER SKIN & 1 ML (200MG ) UNDER SKIN EVERY 14 DAYS. (300MG  TOTAL/MONTH) 03/04/22   Ardith Dark, MD  traZODone (DESYREL) 50 MG tablet TAKE 1 TABLET BY MOUTH EVERY DAY AT BEDTIME AS NEEDED FOR SLEEP 06/20/22   Ardith Dark, MD    Current Outpatient Medications  Medication Sig Dispense Refill   albuterol (VENTOLIN HFA) 108 (90 Base) MCG/ACT inhaler INHALE 2 PUFFS BY MOUTH EVERY 6 HOURS AS NEEDED FOR WHEEZE OR SHORTNESS OF BREATH 18 each 3   buPROPion (WELLBUTRIN XL) 300 MG 24 hr tablet TAKE 1 TABLET BY MOUTH EVERY DAY 90 tablet 1   celecoxib (CELEBREX) 100 MG capsule TAKE 1 CAPSULE BY MOUTH TWICE A DAY AS NEEDED 180 capsule 1   lisdexamfetamine (VYVANSE) 60 MG  capsule Take 1 capsule (60 mg total) by mouth every morning. 30 capsule 0   MAGNESIUM CITRATE PO Take 1 tablet by mouth every evening.     methocarbamol (ROBAXIN) 750 MG tablet Take 1 tablet (750 mg total) by mouth 2 (two) times daily as needed for muscle spasms. (Patient not taking: Reported on 06/14/2022) 90 tablet 1   pregabalin (LYRICA) 200 MG capsule Take 1 capsule (200 mg total) by mouth 2 (two) times daily. 180 capsule 1   testosterone cypionate (DEPOTESTOSTERONE CYPIONATE) 200 MG/ML injection ALTERNATE 0.5 ML (100MG ) UNDER SKIN & 1 ML (200MG ) UNDER SKIN EVERY 14 DAYS. (300MG   TOTAL/MONTH) 2 mL 5   traZODone (DESYREL) 50 MG tablet TAKE 1 TABLET BY MOUTH EVERY DAY AT BEDTIME AS NEEDED FOR SLEEP 90 tablet 1   No current facility-administered medications for this visit.    Allergies as of 07/05/2022   (No Known Allergies)    Family History  Problem Relation Age of Onset   Colon polyps Paternal Grandmother    Rectal cancer Paternal Grandmother    Colon cancer Paternal Grandmother    Esophageal cancer Neg Hx    Stomach cancer Neg Hx     Social History   Socioeconomic History   Marital status: Married    Spouse name: Not on file   Number of children: 2   Years of education: Not on file   Highest education level: Not on file  Occupational History   Occupation: Scientist, research (medical): [employer]  Tobacco Use   Smoking status: Never   Smokeless tobacco: Never  Substance and Sexual Activity   Alcohol use: No   Drug use: No   Sexual activity: Yes  Other Topics Concern   Not on file  Social History Narrative   Lives with wife and two children, all patients of [facility]. Wants to get back to weight lifting. Lives near [place] in [place].   Social Determinants of Health   Financial Resource Strain: Not on file  Food Insecurity: Not on file  Transportation Needs: Not on file  Physical Activity: Not on file  Stress: Not on file  Social Connections: Not on file  Intimate Partner Violence: Not on file    Review of Systems:  All other review of systems negative except as mentioned in the HPI.  Physical Exam: Vital signs in last 24 hours: Blood Pressure (Abnormal) 151/82   Pulse 73   Temperature (Abnormal) 97.3 F (36.3 C)   Height 5\' 11"  (1.803 m)   Weight 215 lb (97.5 kg)   Oxygen Saturation 100%   Body Mass Index 29.99 kg/m  General:   Alert, NAD Lungs:  Clear .   Heart:  Regular rate and rhythm Abdomen:  Soft, nontender and nondistended. Neuro/Psych:  Alert and cooperative. Normal mood and affect. A and O x  3  Reviewed labs, radiology imaging, old records and pertinent past GI work up  Patient is appropriate for planned procedure(s) and anesthesia in an ambulatory setting   K. Scherry Ran , MD [phone]

## 2022-07-05 NOTE — Progress Notes (Signed)
Sedate, gd SR's, VSS, report to RN 

## 2022-07-05 NOTE — Op Note (Signed)
[facility] Patient Name: Brett [name] Procedure Date: 07/05/2022 11:06 AM MRN: [id number] Endoscopist: Napoleon Form , MD, [id number] Age: 47 Referring MD:  Date of Birth: 01-24-1976 Gender: Male Account #: [id number] Procedure:                Colonoscopy Indications:              Screening for colorectal malignant neoplasm Medicines:                Monitored Anesthesia Care Procedure:                Pre-Anesthesia Assessment:                           - Prior to the procedure, a History and Physical                            was performed, and patient medications and                            allergies were reviewed. The patient's tolerance of                            previous anesthesia was also reviewed. The risks                            and benefits of the procedure and the sedation                            options and risks were discussed with the patient.                            All questions were answered, and informed consent                            was obtained. Prior Anticoagulants: The patient has                            taken no anticoagulant or antiplatelet agents. ASA                            Grade Assessment: I - A normal, healthy patient.                            After reviewing the risks and benefits, the patient                            was deemed in satisfactory condition to undergo the                            procedure.                           After obtaining informed consent, the colonoscope  was passed under direct vision. Throughout the                            procedure, the patient's blood pressure, pulse, and                            oxygen saturations were monitored continuously. The                            Olympus PCF-H190DL (#[id number]) Colonoscope was                            introduced through the anus and advanced to the the                            cecum, identified by  appendiceal orifice and                            ileocecal valve. The colonoscopy was performed                            without difficulty. The patient tolerated the                            procedure well. The quality of the bowel                            preparation was good. The ileocecal valve,                            appendiceal orifice, and rectum were photographed. Scope In: 11:10:19 AM Scope Out: 11:28:32 AM Scope Withdrawal Time: 0 hours 11 minutes 8 seconds  Total Procedure Duration: 0 hours 18 minutes 13 seconds  Findings:                 The perianal and digital rectal examinations were                            normal.                           Non-bleeding internal hemorrhoids were found during                            retroflexion. The hemorrhoids were small.                           The exam was otherwise normal throughout the                            examined colon. Complications:            No immediate complications. Estimated Blood Loss:     Estimated blood loss was minimal. Impression:               - Non-bleeding internal hemorrhoids.                           -  No specimens collected. Recommendation:           - Patient has a contact number available for                            emergencies. The signs and symptoms of potential                            delayed complications were discussed with the                            patient. Return to normal activities tomorrow.                            Written discharge instructions were provided to the                            patient.                           - Resume previous diet.                           - Continue present medications.                           - Repeat colonoscopy in 10 years for surveillance. Napoleon FormKavitha V. Jacklin Zwick, MD 07/05/2022 11:33:25 AM This report has been signed electronically.

## 2022-07-05 NOTE — Progress Notes (Signed)
Pt's states no medical or surgical changes since previsit or office visit. 

## 2022-07-06 ENCOUNTER — Telehealth: Payer: Self-pay

## 2022-07-06 NOTE — Telephone Encounter (Signed)
  Follow up Call-     07/05/2022   10:39 AM  Call back number  Post procedure Call Back phone  # [phone]  Permission to leave phone message Yes     Patient questions:  Do you have a fever, pain , or abdominal swelling? No. Pain Score  0 *  Have you tolerated food without any problems? Yes.    Have you been able to return to your normal activities? Yes.    Do you have any questions about your discharge instructions: Diet   No. Medications  No. Follow up visit  No.  Do you have questions or concerns about your Care? No.  Actions: * If pain score is 4 or above: No action needed, pain <4.

## 2022-07-21 NOTE — Progress Notes (Signed)
Brett [name] [address] [place] [address] Phone: [phone] Subjective:   Brett [name], am serving as a scribe for Dr. Antoine Primas.  I'm seeing this patient by the request  of:  Ardith Dark, MD  CC: Knee pain, back pain neck pain  [id number]  Brett [name] is a 47 y.o. male coming in with complaint of back and neck pain. OMT 06/02/2022. Also f/u for L knee pain. Patient states that his back feels worse this morning than usual. Pain increasing over past week. Neck is also sore.   Knee pain persists as well as a lack of ROM. R knee is starting to hurt and give out on occasion. States that he has the sensation of something needing to pop in that knee.   Medications patient has been prescribed: None  Taking:         Reviewed prior external information including notes and imaging from previsou exam, outside providers and external EMR if available.   As well as notes that were available from care everywhere and other healthcare systems.  Past medical history, social, surgical and family history all reviewed in electronic medical record.  No pertanent information unless stated regarding to the chief complaint.   Past Medical History:  Diagnosis Date   Asthma    Bipolar 1 disorder (HCC) 06/15/2016   Previously followed by Psychiatry. Hx of "anger issues." Hx of medication use has included Seroquel and Ativan, both of which have helped him in the past.    Chronic pain of both knees 06/15/2016   Chronic seasonal allergic rhinitis due to pollen 06/15/2016   Depression    Gastroesophageal reflux disease without esophagitis 06/15/2016   History of diabetes mellitus 05/17/2019   Resolved after > 100 pound weight loss and lifestyle changes. 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 No ongoing issues.   Morbid  obesity (HCC) 06/15/2016    No Known Allergies   Review of Systems:  No headache, visual changes, nausea, vomiting, diarrhea, constipation, dizziness, abdominal pain, skin rash, fevers, chills, night sweats, weight loss, swollen lymph nodes, body aches, joint swelling, chest pain, shortness of breath, mood changes. POSITIVE muscle aches  Objective  Blood pressure 124/84, pulse 81, height 5\' 11"  (1.803 m), weight 224 lb (101.6 kg), SpO2 97 %.   General: No apparent distress alert and oriented x3 mood and affect normal, dressed appropriately.  HEENT: Pupils equal, extraocular movements intact  Respiratory: Patient's speak in full sentences and does not appear short of breath  Cardiovascular: No lower extremity edema, non tender, no erythema  Gait mild antalgic MSK:  Back does have some loss of lordosis noted.  The patient does have a some resisted extension of the back noted.  No tenderness in the paraspinal musculature of the lumbar spine.  No true radicular symptoms at this time.  Osteopathic findings  C7 flexed rotated and side bent left T3 extended rotated and side bent right inhaled rib T8 extended rotated and side bent left L2 flexed rotated and side bent left Sacrum left on left       Assessment and Plan:  Spondylolisthesis, lumbosacral region Significant spondylolisthesis previously at the L5.  Responded extremely well to an epidural greater than a year ago.  Do think that this could be beneficial for him to try again.  The patient  will consider this.  We discussed occasions including the Robaxin which did not seem to make a big difference and we will try to Zanaflex.  1 g tabs.  Follow-up with me in 6 weeks before patient moves    Nonallopathic problems  Decision today to treat with OMT was based on Physical Exam  After verbal consent patient was treated with HVLA, ME, FPR techniques in cervical, rib, thoracic, lumbar, and sacral  areas  Patient tolerated the  procedure well with improvement in symptoms  Patient given exercises, stretches and lifestyle modifications  See medications in patient instructions if given  Patient will follow up in 4-8 weeks     The above documentation has been reviewed and is accurate and complete Brett Saa, DO         Note: This dictation was prepared with Dragon dictation along with smaller phrase technology. Any transcriptional errors that result from this process are unintentional.

## 2022-07-26 ENCOUNTER — Other Ambulatory Visit (HOSPITAL_BASED_OUTPATIENT_CLINIC_OR_DEPARTMENT_OTHER): Payer: Self-pay

## 2022-07-26 ENCOUNTER — Other Ambulatory Visit: Payer: Self-pay | Admitting: Family Medicine

## 2022-07-26 MED ORDER — LISDEXAMFETAMINE DIMESYLATE 60 MG PO CAPS
60.0000 mg | ORAL_CAPSULE | ORAL | 0 refills | Status: DC
  Filled 2022-07-26 – 2022-08-09 (×3): qty 30, 30d supply, fill #0

## 2022-07-27 ENCOUNTER — Ambulatory Visit: Payer: BC Managed Care – PPO | Admitting: Family Medicine

## 2022-07-27 VITALS — BP 124/84 | HR 81 | Ht 71.0 in | Wt 224.0 lb

## 2022-07-27 DIAGNOSIS — M9908 Segmental and somatic dysfunction of rib cage: Secondary | ICD-10-CM | POA: Diagnosis not present

## 2022-07-27 DIAGNOSIS — M9903 Segmental and somatic dysfunction of lumbar region: Secondary | ICD-10-CM

## 2022-07-27 DIAGNOSIS — M9904 Segmental and somatic dysfunction of sacral region: Secondary | ICD-10-CM | POA: Diagnosis not present

## 2022-07-27 DIAGNOSIS — M4317 Spondylolisthesis, lumbosacral region: Secondary | ICD-10-CM | POA: Diagnosis not present

## 2022-07-27 DIAGNOSIS — M9901 Segmental and somatic dysfunction of cervical region: Secondary | ICD-10-CM

## 2022-07-27 DIAGNOSIS — M9902 Segmental and somatic dysfunction of thoracic region: Secondary | ICD-10-CM

## 2022-07-27 MED ORDER — TIZANIDINE HCL 4 MG PO TABS: 4.0000 mg | ORAL_TABLET | Freq: Every evening | ORAL | 2 refills | Status: DC

## 2022-07-27 NOTE — Assessment & Plan Note (Signed)
Significant spondylolisthesis previously at the L5.  Responded extremely well to an epidural greater than a year ago.  Do think that this could be beneficial for him to try again.  The patient will consider this.  We discussed occasions including the Robaxin which did not seem to make a big difference and we will try to Zanaflex.  1 g tabs.  Follow-up with me in 6 weeks before patient moves

## 2022-07-27 NOTE — Patient Instructions (Signed)
Try the Zanaflex at night Can try half pill during the day if needed Think about another epidural in the back See me again in 5 to 6 weeks

## 2022-07-28 ENCOUNTER — Other Ambulatory Visit (HOSPITAL_BASED_OUTPATIENT_CLINIC_OR_DEPARTMENT_OTHER): Payer: Self-pay

## 2022-07-29 ENCOUNTER — Other Ambulatory Visit: Payer: Self-pay | Admitting: Family Medicine

## 2022-07-29 DIAGNOSIS — J4530 Mild persistent asthma, uncomplicated: Secondary | ICD-10-CM

## 2022-08-05 ENCOUNTER — Other Ambulatory Visit (HOSPITAL_BASED_OUTPATIENT_CLINIC_OR_DEPARTMENT_OTHER): Payer: Self-pay

## 2022-08-09 ENCOUNTER — Other Ambulatory Visit (HOSPITAL_BASED_OUTPATIENT_CLINIC_OR_DEPARTMENT_OTHER): Payer: Self-pay

## 2022-08-10 ENCOUNTER — Encounter: Payer: BC Managed Care – PPO | Admitting: Family Medicine

## 2022-08-11 ENCOUNTER — Encounter: Payer: Self-pay | Admitting: Family Medicine

## 2022-08-11 ENCOUNTER — Ambulatory Visit (INDEPENDENT_AMBULATORY_CARE_PROVIDER_SITE_OTHER): Payer: BC Managed Care – PPO | Admitting: Family Medicine

## 2022-08-11 VITALS — BP 121/83 | HR 70 | Temp 97.5°F | Ht 71.0 in | Wt 220.0 lb

## 2022-08-11 DIAGNOSIS — R739 Hyperglycemia, unspecified: Secondary | ICD-10-CM | POA: Diagnosis not present

## 2022-08-11 DIAGNOSIS — M2550 Pain in unspecified joint: Secondary | ICD-10-CM

## 2022-08-11 DIAGNOSIS — F5101 Primary insomnia: Secondary | ICD-10-CM | POA: Diagnosis not present

## 2022-08-11 DIAGNOSIS — E291 Testicular hypofunction: Secondary | ICD-10-CM | POA: Diagnosis not present

## 2022-08-11 DIAGNOSIS — G629 Polyneuropathy, unspecified: Secondary | ICD-10-CM | POA: Diagnosis not present

## 2022-08-11 DIAGNOSIS — Z13220 Encounter for screening for lipoid disorders: Secondary | ICD-10-CM | POA: Diagnosis not present

## 2022-08-11 DIAGNOSIS — Z0001 Encounter for general adult medical examination with abnormal findings: Secondary | ICD-10-CM

## 2022-08-11 DIAGNOSIS — Z131 Encounter for screening for diabetes mellitus: Secondary | ICD-10-CM

## 2022-08-11 DIAGNOSIS — D239 Other benign neoplasm of skin, unspecified: Secondary | ICD-10-CM

## 2022-08-11 DIAGNOSIS — E538 Deficiency of other specified B group vitamins: Secondary | ICD-10-CM | POA: Diagnosis not present

## 2022-08-11 DIAGNOSIS — J4540 Moderate persistent asthma, uncomplicated: Secondary | ICD-10-CM

## 2022-08-11 DIAGNOSIS — F902 Attention-deficit hyperactivity disorder, combined type: Secondary | ICD-10-CM

## 2022-08-11 DIAGNOSIS — F39 Unspecified mood [affective] disorder: Secondary | ICD-10-CM | POA: Diagnosis not present

## 2022-08-11 LAB — LIPID PANEL
Cholesterol: 207 mg/dL — ABNORMAL HIGH (ref 0–200)
HDL: 47.8 mg/dL (ref >39.00)
LDL Cholesterol: 128 mg/dL — ABNORMAL HIGH (ref 0–99)
NonHDL: 158.9
Total CHOL/HDL Ratio: 4
Triglycerides: 157 mg/dL — ABNORMAL HIGH (ref 0.0–149.0)
VLDL: 31.4 mg/dL (ref 0.0–40.0)

## 2022-08-11 LAB — PSA: PSA: 0.85 ng/mL (ref 0.10–4.00)

## 2022-08-11 LAB — COMPREHENSIVE METABOLIC PANEL
ALT: 44 U/L (ref 0–53)
AST: 69 U/L — ABNORMAL HIGH (ref 0–37)
Albumin: 4.7 g/dL (ref 3.5–5.2)
Alkaline Phosphatase: 43 U/L (ref 39–117)
BUN: 20 mg/dL (ref 6–23)
CO2: 33 mEq/L — ABNORMAL HIGH (ref 19–32)
Calcium: 9.6 mg/dL (ref 8.4–10.5)
Chloride: 100 mEq/L (ref 96–112)
Creatinine, Ser: 0.84 mg/dL (ref 0.40–1.50)
GFR: 103.77 mL/min (ref >60.00)
Glucose, Bld: 91 mg/dL (ref 70–99)
Potassium: 5.2 mEq/L — ABNORMAL HIGH (ref 3.5–5.1)
Sodium: 140 mEq/L (ref 135–145)
Total Bilirubin: 1.2 mg/dL (ref 0.2–1.2)
Total Protein: 7.2 g/dL (ref 6.0–8.3)

## 2022-08-11 LAB — CBC
HCT: 49.4 % (ref 39.0–52.0)
Hemoglobin: 16.6 g/dL (ref 13.0–17.0)
MCHC: 33.7 g/dL (ref 30.0–36.0)
MCV: 90.6 fl (ref 78.0–100.0)
Platelets: 99 10*3/uL — ABNORMAL LOW (ref 150.0–400.0)
RBC: 5.45 Mil/uL (ref 4.22–5.81)
RDW: 14.1 % (ref 11.5–15.5)
WBC: 4.5 10*3/uL (ref 4.0–10.5)

## 2022-08-11 LAB — HEMOGLOBIN A1C: Hgb A1c MFr Bld: 5.5 % (ref 4.6–6.5)

## 2022-08-11 LAB — VITAMIN B12: Vitamin B-12: 553 pg/mL (ref 211–911)

## 2022-08-11 LAB — TESTOSTERONE: Testosterone: 148.41 ng/dL — ABNORMAL LOW (ref 300.00–890.00)

## 2022-08-11 LAB — TSH: TSH: 2.19 u[IU]/mL (ref 0.35–5.50)

## 2022-08-11 MED ORDER — CELECOXIB 100 MG PO CAPS
100.0000 mg | ORAL_CAPSULE | Freq: Two times a day (BID) | ORAL | 1 refills | Status: DC | PRN
Start: 2022-08-11 — End: 2022-11-23

## 2022-08-11 MED ORDER — PREGABALIN 200 MG PO CAPS
200.0000 mg | ORAL_CAPSULE | Freq: Two times a day (BID) | ORAL | 1 refills | Status: AC
Start: 2022-08-11 — End: ?

## 2022-08-11 NOTE — Assessment & Plan Note (Signed)
On Lyrica 200 mg twice daily.  Will refill today.

## 2022-08-11 NOTE — Progress Notes (Signed)
Chief Complaint:  Brett [name] is a 47 y.o. male who presents today for his annual comprehensive physical exam.    Assessment/Plan:  Chronic Problems Addressed Today: Dysplastic nevus Recently saw dermatology.  Had a few additional areas taken off consistent with irritated nevus.  Mood disorder (HCC) Stable on Wellbutrin 300 mg daily.  Does not need refill today.  Peripheral polyneuropathy On Lyrica 200 mg twice daily.  Will refill today.  Hypogonadism in male On testosterone placement 300 mg total monthly.  Check labs today.  Moderate persistent asthma Stable.  No recent flares.  Attention deficit hyperactivity disorder (ADHD), combined type On Vyvanse 60 mg daily.  Tolerating well.  No significant side effects.  Medications help with ability to stay focused and on task.  Does not need refill today.  Vitamin B12 deficiency Check B12.  Insomnia On trazodone 50 mg nightly.  Doing reasonably well with this.   Preventative Healthcare: Check labs. UpToDate on colon cancer screening.   Patient Counseling(The following topics were reviewed and/or handout was given):  -Nutrition: Stressed importance of moderation in sodium/caffeine intake, saturated fat and cholesterol, caloric balance, sufficient intake of fresh fruits, vegetables, and fiber.  -Stressed the importance of regular exercise.   -Substance Abuse: Discussed cessation/primary prevention of tobacco, alcohol, or other drug use; driving or other dangerous activities under the influence; availability of treatment for abuse.   -Injury prevention: Discussed safety belts, safety helmets, smoke detector, smoking near bedding or upholstery.   -Sexuality: Discussed sexually transmitted diseases, partner selection, use of condoms, avoidance of unintended pregnancy and contraceptive alternatives.   -Dental health: Discussed importance of regular tooth brushing, flossing, and dental visits.  -Health maintenance and  immunizations reviewed. Please refer to Health maintenance section.  Return to care in 1 year for next preventative visit.     Subjective:  HPI:  He has no acute complaints today. See Assessment / plan for status of chronic conditions.   Lifestyle Diet: Balanced. Plenty of fruits and vegetables.  Exercise: Routine.      08/11/2022    8:32 AM  Depression screen PHQ 2/9  Decreased Interest 0  Down, Depressed, Hopeless 0  PHQ - 2 Score 0   There are no preventive care reminders to display for this patient.   ROS: Per HPI, otherwise a complete review of systems was negative.   PMH:  The following were reviewed and entered/updated in epic: Past Medical History:  Diagnosis Date   Asthma    Bipolar 1 disorder (HCC) 06/15/2016   Previously followed by Psychiatry. Hx of "anger issues." Hx of medication use has included Seroquel and Ativan, both of which have helped him in the past.    Chronic pain of both knees 06/15/2016   Chronic seasonal allergic rhinitis due to pollen 06/15/2016   Depression    Gastroesophageal reflux disease without esophagitis 06/15/2016   History of diabetes mellitus 05/17/2019   Resolved after > 100 pound weight loss and lifestyle changes. 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 No ongoing issues.   Morbid obesity (HCC) 06/15/2016   Patient Active Problem List   Diagnosis Date Noted   Right calf pain 04/25/2022   Dysplastic nevus 02/09/2022   Pain in left acromioclavicular joint 01/13/2022   Left hip pain 07/07/2021   Erectile dysfunction 06/21/2021   Somatic dysfunction of spine, lumbar 12/04/2020  Vitamin B12 deficiency 05/17/2019   History of diabetes mellitus 05/17/2019   Attention deficit hyperactivity disorder (ADHD), combined type 09/27/2017   Unilateral primary osteoarthritis, left knee 07/10/2017   Spondylolisthesis,  lumbosacral region 07/10/2017   Chronic midline low back pain without sciatica 07/10/2017   Hypogonadism in male 05/20/2017   Mood disorder (HCC) 06/15/2016   Chronic seasonal allergic rhinitis due to pollen 06/15/2016   Peripheral polyneuropathy 06/15/2016   Insomnia 06/15/2016   Moderate persistent asthma    Past Surgical History:  Procedure Laterality Date   BICEPS TENDON REPAIR     PANNICULECTOMY  11/15/2018   loose skin removal surgery    Family History  Problem Relation Age of Onset   Colon polyps Paternal Grandmother    Rectal cancer Paternal Grandmother    Colon cancer Paternal Grandmother    Esophageal cancer Neg Hx    Stomach cancer Neg Hx     Medications- reviewed and updated Current Outpatient Medications  Medication Sig Dispense Refill   albuterol (VENTOLIN HFA) 108 (90 Base) MCG/ACT inhaler INHALE 2 PUFFS BY MOUTH EVERY 6 HOURS AS NEEDED FOR WHEEZE OR SHORTNESS OF BREATH 18 each 3   buPROPion (WELLBUTRIN XL) 300 MG 24 hr tablet TAKE 1 TABLET BY MOUTH EVERY DAY 90 tablet 1   lisdexamfetamine (VYVANSE) 60 MG capsule Take 1 capsule (60 mg total) by mouth every morning. 30 capsule 0   MAGNESIUM CITRATE PO Take 1 tablet by mouth every evening.     terbinafine (LAMISIL) 250 MG tablet Take 250 mg by mouth daily.     testosterone cypionate (DEPOTESTOSTERONE CYPIONATE) 200 MG/ML injection ALTERNATE 0.5 ML (100MG ) UNDER SKIN & 1 ML (200MG ) UNDER SKIN EVERY 14 DAYS. (300MG  TOTAL/MONTH) 2 mL 5   tiZANidine (ZANAFLEX) 4 MG tablet Take 1 tablet (4 mg total) by mouth Nightly. 30 tablet 2   traZODone (DESYREL) 50 MG tablet TAKE 1 TABLET BY MOUTH EVERY DAY AT BEDTIME AS NEEDED FOR SLEEP 90 tablet 1   celecoxib (CELEBREX) 100 MG capsule Take 1 capsule (100 mg total) by mouth 2 (two) times daily as needed. 180 capsule 1   pregabalin (LYRICA) 200 MG capsule Take 1 capsule (200 mg total) by mouth 2 (two) times daily. 180 capsule 1   No current facility-administered medications for  this visit.    Allergies-reviewed and updated No Known Allergies  Social History   Socioeconomic History   Marital status: Married    Spouse name: Not on file   Number of children: 2   Years of education: Not on file   Highest education level: Not on file  Occupational History   Occupation: Scientist, research (medical): [employer]  Tobacco Use   Smoking status: Never   Smokeless tobacco: Never  Vaping Use   Vaping Use: Never used  Substance and Sexual Activity   Alcohol use: No   Drug use: No   Sexual activity: Yes  Other Topics Concern   Not on file  Social History Narrative   Lives with wife and two children, all patients of [facility]. Wants to get back to weight lifting. Lives near [place] in [place].   Social Determinants of Health   Financial Resource Strain: Not on file  Food Insecurity: Not on file  Transportation Needs: Not on file  Physical Activity: Not on file  Stress: Not on file  Social Connections: Not on file        Objective:  Physical Exam: BP 121/83  Pulse 70   Temp (!) 97.5 F (36.4 C) (Temporal)   Ht 5\' 11"  (1.803 m)   Wt 220 lb (99.8 kg)   SpO2 98%   BMI 30.68 kg/m   Body mass index is 30.68 kg/m. Wt Readings from Last 3 Encounters:  08/11/22 220 lb (99.8 kg)  07/27/22 224 lb (101.6 kg)  07/05/22 215 lb (97.5 kg)   Gen: NAD, resting comfortably HEENT: TMs normal bilaterally. OP clear. No thyromegaly noted.  CV: RRR with no murmurs appreciated Pulm: NWOB, CTAB with no crackles, wheezes, or rhonchi GI: Normal bowel sounds present. Soft, Nontender, Nondistended. MSK: no edema, cyanosis, or clubbing noted Skin: warm, dry Neuro: CN2-12 grossly intact. Strength 5/5 in upper and lower extremities. Reflexes symmetric and intact bilaterally.  Psych: Normal affect and thought content     Sapphira Harjo M. Jimmey Ralph, MD 08/11/2022 9:15 AM

## 2022-08-11 NOTE — Assessment & Plan Note (Signed)
On trazodone 50 mg nightly.  Doing reasonably well with this.

## 2022-08-11 NOTE — Assessment & Plan Note (Signed)
Recently saw dermatology.  Had a few additional areas taken off consistent with irritated nevus.

## 2022-08-11 NOTE — Assessment & Plan Note (Signed)
On testosterone placement 300 mg total monthly.  Check labs today.

## 2022-08-11 NOTE — Assessment & Plan Note (Signed)
Check B12 

## 2022-08-11 NOTE — Assessment & Plan Note (Signed)
Stable. No recent flares.  

## 2022-08-11 NOTE — Patient Instructions (Addendum)
It was very nice to see you today!  We will check blood work today and refill your medications.  Return in about 1 year (around 08/11/2023) for Annual Physical.   Take care, Dr Jimmey Ralph  PLEASE NOTE:  If you had any lab tests, please let us know if you have not heard back within a few days. You may see your results on mychart before we have a chance to review them but we will give you a call once they are reviewed by [place].   If we ordered any referrals today, please let us know if you have not heard from their office within the next week.   If you had any urgent prescriptions sent in today, please check with the pharmacy within an hour of our visit to make sure the prescription was transmitted appropriately.   Please try these tips to maintain a healthy lifestyle:  Eat at least 3 REAL meals and 1-2 snacks per day.  Aim for no more than 5 hours between eating.  If you eat breakfast, please do so within one hour of getting up.   Each meal should contain half fruits/vegetables, one quarter protein, and one quarter carbs (no bigger than a computer mouse)  Cut down on sweet beverages. This includes juice, soda, and sweet tea.   Drink at least 1 glass of water with each meal and aim for at least 8 glasses per day  Exercise at least 150 minutes every week.    Preventive Care 14-64 Years Old, Male Preventive care refers to lifestyle choices and visits with your health care provider that can promote health and wellness. Preventive care visits are also called wellness exams. What can I expect for my preventive care visit? Counseling During your preventive care visit, your health care provider may ask about your: Medical history, including: Past medical problems. Family medical history. Current health, including: Emotional well-being. Home life and relationship well-being. Sexual activity. Lifestyle, including: Alcohol, nicotine or tobacco, and drug use. Access to firearms. Diet,  exercise, and sleep habits. Safety issues such as seatbelt and bike helmet use. Sunscreen use. Work and work Astronomer. Physical exam Your health care provider will check your: Height and weight. These may be used to calculate your BMI (body mass index). BMI is a measurement that tells if you are at a healthy weight. Waist circumference. This measures the distance around your waistline. This measurement also tells if you are at a healthy weight and may help predict your risk of certain diseases, such as type 2 diabetes and high blood pressure. Heart rate and blood pressure. Body temperature. Skin for abnormal spots. What immunizations do I need?  Vaccines are usually given at various ages, according to a schedule. Your health care provider will recommend vaccines for you based on your age, medical history, and lifestyle or other factors, such as travel or where you work. What tests do I need? Screening Your health care provider may recommend screening tests for certain conditions. This may include: Lipid and cholesterol levels. Diabetes screening. This is done by checking your blood sugar (glucose) after you have not eaten for a while (fasting). Hepatitis B test. Hepatitis C test. HIV (human immunodeficiency virus) test. STI (sexually transmitted infection) testing, if you are at risk. Lung cancer screening. Prostate cancer screening. Colorectal cancer screening. Talk with your health care provider about your test results, treatment options, and if necessary, the need for more tests. Follow these instructions at home: Eating and drinking  Eat a diet  that includes fresh fruits and vegetables, whole grains, lean protein, and low-fat dairy products. Take vitamin and mineral supplements as recommended by your health care provider. Do not drink alcohol if your health care provider tells you not to drink. If you drink alcohol: Limit how much you have to 0-2 drinks a day. Know how much  alcohol is in your drink. In the [place] one drink equals one 12 oz bottle of beer (355 mL), one 5 oz glass of wine (148 mL), or one 1 oz glass of hard liquor (44 mL). Lifestyle Brush your teeth every morning and night with fluoride toothpaste. Floss one time each day. Exercise for at least 30 minutes 5 or more days each week. Do not use any products that contain nicotine or tobacco. These products include cigarettes, chewing tobacco, and vaping devices, such as e-cigarettes. If you need help quitting, ask your health care provider. Do not use drugs. If you are sexually active, practice safe sex. Use a condom or other form of protection to prevent STIs. Take aspirin only as told by your health care provider. Make sure that you understand how much to take and what form to take. Work with your health care provider to find out whether it is safe and beneficial for you to take aspirin daily. Find healthy ways to manage stress, such as: Meditation, yoga, or listening to music. Journaling. Talking to a trusted person. Spending time with friends and family. Minimize exposure to UV radiation to reduce your risk of skin cancer. Safety Always wear your seat belt while driving or riding in a vehicle. Do not drive: If you have been drinking alcohol. Do not ride with someone who has been drinking. When you are tired or distracted. While texting. If you have been using any mind-altering substances or drugs. Wear a helmet and other protective equipment during sports activities. If you have firearms in your house, make sure you follow all gun safety procedures. What's next? Go to your health care provider once a year for an annual wellness visit. Ask your health care provider how often you should have your eyes and teeth checked. Stay up to date on all vaccines. This information is not intended to replace advice given to you by your health care provider. Make sure you discuss any questions you have with  your health care provider. Document Revised: 09/09/2020 Document Reviewed: 09/09/2020 Elsevier Patient Education  2023 [employer].

## 2022-08-11 NOTE — Assessment & Plan Note (Signed)
Stable on Wellbutrin 300 mg daily.  Does not need refill today.

## 2022-08-11 NOTE — Assessment & Plan Note (Signed)
On Vyvanse 60 mg daily.  Tolerating well.  No significant side effects.  Medications help with ability to stay focused and on task.  Does not need refill today.

## 2022-08-12 NOTE — Progress Notes (Signed)
Cholesterol is up a bit since last time.  He should continue to work on diet and exercise and have this rechecked in a year.  Testosterone is a bit low. Can we please verify that this was checked midcycle?  His platelets were low.  This may be a lab error.  Please have him come back to recheck CBC and peripheral smear.  One of his liver numbers was slightly elevated.  Please have him come back to recheck c-Met.  The rest of his labs are all normal and we can recheck in year.

## 2022-08-15 ENCOUNTER — Other Ambulatory Visit: Payer: Self-pay | Admitting: *Deleted

## 2022-08-15 ENCOUNTER — Encounter: Payer: Self-pay | Admitting: Family Medicine

## 2022-08-15 DIAGNOSIS — R748 Abnormal levels of other serum enzymes: Secondary | ICD-10-CM

## 2022-08-15 DIAGNOSIS — R7989 Other specified abnormal findings of blood chemistry: Secondary | ICD-10-CM

## 2022-08-15 NOTE — Telephone Encounter (Signed)
See results note. 

## 2022-08-29 ENCOUNTER — Other Ambulatory Visit: Payer: Self-pay | Admitting: *Deleted

## 2022-08-29 ENCOUNTER — Other Ambulatory Visit (INDEPENDENT_AMBULATORY_CARE_PROVIDER_SITE_OTHER): Payer: BC Managed Care – PPO

## 2022-08-29 DIAGNOSIS — E291 Testicular hypofunction: Secondary | ICD-10-CM

## 2022-08-29 LAB — TESTOSTERONE: Testosterone: 253.85 ng/dL — ABNORMAL LOW (ref 300.00–890.00)

## 2022-08-29 LAB — COMPREHENSIVE METABOLIC PANEL
ALT: 20 U/L (ref 0–53)
AST: 18 U/L (ref 0–37)
Albumin: 4.3 g/dL (ref 3.5–5.2)
Alkaline Phosphatase: 32 U/L — ABNORMAL LOW (ref 39–117)
BUN: 21 mg/dL (ref 6–23)
CO2: 30 mEq/L (ref 19–32)
Calcium: 9 mg/dL (ref 8.4–10.5)
Chloride: 102 mEq/L (ref 96–112)
Creatinine, Ser: 0.88 mg/dL (ref 0.40–1.50)
GFR: 102.29 mL/min (ref >60.00)
Glucose, Bld: 90 mg/dL (ref 70–99)
Potassium: 4.5 mEq/L (ref 3.5–5.1)
Sodium: 141 mEq/L (ref 135–145)
Total Bilirubin: 0.5 mg/dL (ref 0.2–1.2)
Total Protein: 6.4 g/dL (ref 6.0–8.3)

## 2022-08-29 LAB — CBC
HCT: 45.6 % (ref 39.0–52.0)
Hemoglobin: 15 g/dL (ref 13.0–17.0)
MCHC: 32.8 g/dL (ref 30.0–36.0)
MCV: 91.1 fl (ref 78.0–100.0)
Platelets: 114 10*3/uL — ABNORMAL LOW (ref 150.0–400.0)
RBC: 5.01 Mil/uL (ref 4.22–5.81)
RDW: 14.2 % (ref 11.5–15.5)
WBC: 3.8 10*3/uL — ABNORMAL LOW (ref 4.0–10.5)

## 2022-08-30 ENCOUNTER — Encounter: Payer: Self-pay | Admitting: Family Medicine

## 2022-08-30 DIAGNOSIS — E291 Testicular hypofunction: Secondary | ICD-10-CM

## 2022-08-30 NOTE — Telephone Encounter (Signed)
Please see pt msg and advise 

## 2022-08-30 NOTE — Telephone Encounter (Signed)
See result note. Ok for him to increase to 200 mg every 2 weeks. This should be recheck again in about 6 weeks.  Katina Degree. Jimmey Ralph, MD 08/30/2022 2:01 PM

## 2022-08-30 NOTE — Progress Notes (Signed)
Liver numbers are back to normal.  Platelets are elevated and back up to near his baseline.  Testosterone level is also elevated though still a bit low. It would be reasonable to increase the dose to 200 mg every 2 weeks.  We would need to recheck again in a few months if we were to increase the dose. Please have him come back to have this rechecked. Alternatively, we can leave as it is for now until he can discuss with his new PCP

## 2022-09-01 ENCOUNTER — Other Ambulatory Visit: Payer: Self-pay | Admitting: Family Medicine

## 2022-09-01 ENCOUNTER — Other Ambulatory Visit (HOSPITAL_BASED_OUTPATIENT_CLINIC_OR_DEPARTMENT_OTHER): Payer: Self-pay

## 2022-09-01 MED ORDER — LISDEXAMFETAMINE DIMESYLATE 60 MG PO CAPS
60.0000 mg | ORAL_CAPSULE | ORAL | 0 refills | Status: AC
  Filled 2022-09-01 – 2022-09-05 (×2): qty 30, 30d supply, fill #0

## 2022-09-05 ENCOUNTER — Other Ambulatory Visit: Payer: Self-pay

## 2022-09-05 ENCOUNTER — Other Ambulatory Visit (HOSPITAL_BASED_OUTPATIENT_CLINIC_OR_DEPARTMENT_OTHER): Payer: Self-pay

## 2022-09-06 NOTE — Telephone Encounter (Signed)
See note

## 2022-09-07 MED ORDER — TESTOSTERONE CYPIONATE 200 MG/ML IM SOLN
200.0000 mg | INTRAMUSCULAR | 5 refills | Status: AC
Start: 2022-09-07 — End: ?

## 2022-09-07 NOTE — Progress Notes (Signed)
[name] [address] [place] [address] Phone: [phone] Subjective:   Bruce Donath, am serving as a scribe for Dr. Antoine Primas.  I'm seeing this patient by the request  of:  Ardith Dark, MD  CC: Back and neck pain follow-up  [id number]  Brett [name] is a 47 y.o. male coming in with complaint of back and neck pain. OMT 07/27/2022. Patient states that he is stiff today. Knees are painful as he is not working.   Medications patient has been prescribed: Zanaflex  Taking:         Reviewed prior external information including notes and imaging from previsou exam, outside providers and external EMR if available.   As well as notes that were available from care everywhere and other healthcare systems.  Past medical history, social, surgical and family history all reviewed in electronic medical record.  No pertanent information unless stated regarding to the chief complaint.   Past Medical History:  Diagnosis Date   Asthma    Bipolar 1 disorder (HCC) 06/15/2016   Previously followed by Psychiatry. Hx of "anger issues." Hx of medication use has included Seroquel and Ativan, both of which have helped him in the past.    Chronic pain of both knees 06/15/2016   Chronic seasonal allergic rhinitis due to pollen 06/15/2016   Depression    Gastroesophageal reflux disease without esophagitis 06/15/2016   History of diabetes mellitus 05/17/2019   Resolved after > 100 pound weight loss and lifestyle changes. 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 No ongoing issues.   Morbid obesity (HCC) 06/15/2016    No Known Allergies   Review of Systems:  No headache, visual changes, nausea, vomiting, diarrhea, constipation, dizziness, abdominal pain, skin rash, fevers, chills, night sweats, weight loss, swollen lymph  nodes, body aches, joint swelling, chest pain, shortness of breath, mood changes. POSITIVE muscle aches  Objective  Blood pressure 122/84, pulse 72, height 5\' 11"  (1.803 m), weight 226 lb (102.5 kg), SpO2 98 %.   General: No apparent distress alert and oriented x3 mood and affect normal, dressed appropriately.  HEENT: Pupils equal, extraocular movements intact  Respiratory: Patient's speak in full sentences and does not appear short of breath  Cardiovascular: No lower extremity edema, non tender, no erythema  Left shoulder does have positive impingement and positive crossover noted.  No significant weakness noted at the moment.  Patient is tender to palpation over the acromioclavicular joint fairly significantly.  Osteopathic findings  C3 flexed rotated and side bent left C6 flexed rotated and side bent left T3 extended rotated and side bent left inhaled rib T8 extended rotated and side bent right L2 flexed rotated and side bent right Sacrum right on right  After verbal consent patient was prepped with alcohol swab and with a 25-gauge half inch needle injected with 0.5 cc of 0.5% Marcaine and 0.5 cc of Kenalog 40 mg/mL.  No blood loss.  Band-Aid placed.  Postinjection instructions given     Assessment and Plan:  Pain in left acromioclavicular joint Repeat injection given today, tolerated the procedure well, chronic, with worsening symptoms.  Has been moving boxes that seem to aggravate it.  Patient has done well over the course of time.  Follow-up with me again in 6 to 8 weeks.    Nonallopathic problems  Decision  today to treat with OMT was based on Physical Exam  After verbal consent patient was treated with HVLA, ME, FPR techniques in cervical, rib, thoracic, lumbar, and sacral  areas  Patient tolerated the procedure well with improvement in symptoms  Patient given exercises, stretches and lifestyle modifications  See medications in patient instructions if given  Patient  will follow up in 4-8 weeks     The above documentation has been reviewed and is accurate and complete Judi Saa, DO         Note: This dictation was prepared with Dragon dictation along with smaller phrase technology. Any transcriptional errors that result from this process are unintentional.

## 2022-09-09 ENCOUNTER — Encounter: Payer: Self-pay | Admitting: Family Medicine

## 2022-09-09 ENCOUNTER — Ambulatory Visit: Payer: BC Managed Care – PPO | Admitting: Family Medicine

## 2022-09-09 ENCOUNTER — Other Ambulatory Visit (HOSPITAL_BASED_OUTPATIENT_CLINIC_OR_DEPARTMENT_OTHER): Payer: Self-pay

## 2022-09-09 VITALS — BP 122/84 | HR 72 | Ht 71.0 in | Wt 226.0 lb

## 2022-09-09 DIAGNOSIS — M9904 Segmental and somatic dysfunction of sacral region: Secondary | ICD-10-CM

## 2022-09-09 DIAGNOSIS — M9908 Segmental and somatic dysfunction of rib cage: Secondary | ICD-10-CM | POA: Diagnosis not present

## 2022-09-09 DIAGNOSIS — M25512 Pain in left shoulder: Secondary | ICD-10-CM

## 2022-09-09 DIAGNOSIS — M9901 Segmental and somatic dysfunction of cervical region: Secondary | ICD-10-CM | POA: Diagnosis not present

## 2022-09-09 DIAGNOSIS — M4317 Spondylolisthesis, lumbosacral region: Secondary | ICD-10-CM

## 2022-09-09 DIAGNOSIS — M9903 Segmental and somatic dysfunction of lumbar region: Secondary | ICD-10-CM | POA: Diagnosis not present

## 2022-09-09 DIAGNOSIS — M9902 Segmental and somatic dysfunction of thoracic region: Secondary | ICD-10-CM

## 2022-09-09 MED ORDER — TIZANIDINE HCL 4 MG PO TABS: 4.0000 mg | ORAL_TABLET | Freq: Every day | ORAL | 0 refills | Status: AC

## 2022-09-09 MED ORDER — METHYLPREDNISOLONE ACETATE 40 MG/ML IJ SUSP
40.0000 mg | Freq: Once | INTRAMUSCULAR | Status: AC
Start: 2022-09-09 — End: 2022-09-09
  Administered 2022-09-09: 40 mg via INTRAMUSCULAR

## 2022-09-09 MED ORDER — KETOROLAC TROMETHAMINE 30 MG/ML IJ SOLN
30.0000 mg | Freq: Once | INTRAMUSCULAR | Status: AC
Start: 2022-09-09 — End: 2022-09-09
  Administered 2022-09-09: 30 mg via INTRAMUSCULAR

## 2022-09-09 NOTE — Assessment & Plan Note (Addendum)
Chronic problem with worsening symptoms with patient moving. Given refill of the Zanaflex.  Discussed more exercises that I think will be beneficial.  Follow-up with me again as needed with patient moving to [place] worsening pain as well also given patient Toradol and Depo-Medrol with patient doing a lot more packing

## 2022-09-09 NOTE — Patient Instructions (Addendum)
Zanaflex called in  Injection in [facility] joint Injections in backside Best of luck to you

## 2022-09-09 NOTE — Assessment & Plan Note (Addendum)
Repeat injection given today, tolerated the procedure well, chronic, with worsening symptoms.  Has been moving boxes that seem to aggravate it.  Patient has done well over the course of time.

## 2022-09-12 ENCOUNTER — Other Ambulatory Visit (HOSPITAL_BASED_OUTPATIENT_CLINIC_OR_DEPARTMENT_OTHER): Payer: Self-pay

## 2022-09-13 ENCOUNTER — Other Ambulatory Visit: Payer: Self-pay

## 2022-09-13 ENCOUNTER — Other Ambulatory Visit (HOSPITAL_BASED_OUTPATIENT_CLINIC_OR_DEPARTMENT_OTHER): Payer: Self-pay

## 2022-10-01 ENCOUNTER — Other Ambulatory Visit (HOSPITAL_BASED_OUTPATIENT_CLINIC_OR_DEPARTMENT_OTHER): Payer: Self-pay

## 2022-11-23 ENCOUNTER — Other Ambulatory Visit: Payer: Self-pay | Admitting: Family Medicine

## 2022-11-23 DIAGNOSIS — M2550 Pain in unspecified joint: Secondary | ICD-10-CM

## 2022-12-05 ENCOUNTER — Other Ambulatory Visit (HOSPITAL_COMMUNITY): Payer: Self-pay

## 2022-12-05 ENCOUNTER — Telehealth: Payer: Self-pay

## 2022-12-05 NOTE — Telephone Encounter (Signed)
Pharmacy Patient Advocate Encounter  Received notification from [facility] that Prior Authorization for Celecoxib 100MG  capsules has been APPROVED from 12-05-2022 to 12-05-2023. Ran test claim, Copay is $45.00. Quantity approved 180 capsules per 90 days. This test claim was processed through [facility]- copay amounts may vary at other pharmacies due to pharmacy/plan contracts, or as the patient moves through the different stages of their insurance plan.   PA #/Case ID/Reference #: [id number]

## 2023-11-30 ENCOUNTER — Telehealth: Payer: Self-pay

## 2023-11-30 NOTE — Telephone Encounter (Signed)
 Pharmacy Patient Advocate Encounter   Received notification from CoverMyMeds that prior authorization for Celecoxib  100MG  capsules is due for renewal.   Insurance verification completed.   The patient is insured through [employer].  Action: Patient hasn't been seen in your office in over a year. Plan requires updated chart notes for PA renewal.
